# Patient Record
Sex: Female | Born: 1945 | Hispanic: Yes | Marital: Single | Smoking: Never smoker
Health system: Southern US, Community
[De-identification: ages and names within clinical notes are randomized; demographics above are authoritative.]

## PROBLEM LIST (undated history)

## (undated) DIAGNOSIS — E785 Hyperlipidemia, unspecified: Secondary | ICD-10-CM

## (undated) DIAGNOSIS — M545 Low back pain, unspecified: Secondary | ICD-10-CM

## (undated) DIAGNOSIS — F419 Anxiety disorder, unspecified: Secondary | ICD-10-CM

## (undated) DIAGNOSIS — M1990 Unspecified osteoarthritis, unspecified site: Secondary | ICD-10-CM

## (undated) DIAGNOSIS — G5600 Carpal tunnel syndrome, unspecified upper limb: Secondary | ICD-10-CM

## (undated) DIAGNOSIS — Z8719 Personal history of other diseases of the digestive system: Secondary | ICD-10-CM

## (undated) DIAGNOSIS — D126 Benign neoplasm of colon, unspecified: Secondary | ICD-10-CM

## (undated) DIAGNOSIS — M542 Cervicalgia: Secondary | ICD-10-CM

## (undated) DIAGNOSIS — M549 Dorsalgia, unspecified: Secondary | ICD-10-CM

## (undated) DIAGNOSIS — H25019 Cortical age-related cataract, unspecified eye: Secondary | ICD-10-CM

## (undated) DIAGNOSIS — K449 Diaphragmatic hernia without obstruction or gangrene: Secondary | ICD-10-CM

## (undated) DIAGNOSIS — I10 Essential (primary) hypertension: Secondary | ICD-10-CM

## (undated) DIAGNOSIS — K219 Gastro-esophageal reflux disease without esophagitis: Secondary | ICD-10-CM

## (undated) DIAGNOSIS — M51369 Other intervertebral disc degeneration, lumbar region without mention of lumbar back pain or lower extremity pain: Secondary | ICD-10-CM

## (undated) DIAGNOSIS — J45909 Unspecified asthma, uncomplicated: Secondary | ICD-10-CM

## (undated) DIAGNOSIS — N189 Chronic kidney disease, unspecified: Secondary | ICD-10-CM

## (undated) DIAGNOSIS — D649 Anemia, unspecified: Secondary | ICD-10-CM

## (undated) DIAGNOSIS — R519 Headache, unspecified: Secondary | ICD-10-CM

## (undated) DIAGNOSIS — M5136 Other intervertebral disc degeneration, lumbar region: Secondary | ICD-10-CM

## (undated) DIAGNOSIS — J309 Allergic rhinitis, unspecified: Secondary | ICD-10-CM

## (undated) DIAGNOSIS — E119 Type 2 diabetes mellitus without complications: Secondary | ICD-10-CM

## (undated) DIAGNOSIS — F32A Depression, unspecified: Secondary | ICD-10-CM

## (undated) DIAGNOSIS — G459 Transient cerebral ischemic attack, unspecified: Secondary | ICD-10-CM

## (undated) DIAGNOSIS — K5790 Diverticulosis of intestine, part unspecified, without perforation or abscess without bleeding: Secondary | ICD-10-CM

## (undated) DIAGNOSIS — K2970 Gastritis, unspecified, without bleeding: Secondary | ICD-10-CM

## (undated) DIAGNOSIS — E669 Obesity, unspecified: Secondary | ICD-10-CM

## (undated) DIAGNOSIS — G43909 Migraine, unspecified, not intractable, without status migrainosus: Secondary | ICD-10-CM

## (undated) DIAGNOSIS — S62109A Fracture of unspecified carpal bone, unspecified wrist, initial encounter for closed fracture: Secondary | ICD-10-CM

## (undated) DIAGNOSIS — R51 Headache: Secondary | ICD-10-CM

## (undated) DIAGNOSIS — I639 Cerebral infarction, unspecified: Secondary | ICD-10-CM

## (undated) HISTORY — PX: OOPHORECTOMY: SHX86

## (undated) HISTORY — PX: EYE SURGERY: SHX253

## (undated) HISTORY — PX: OTHER SURGICAL HISTORY: SHX169

---

## 1965-07-18 HISTORY — PX: BREAST EXCISIONAL BIOPSY: SUR124

## 2005-07-04 ENCOUNTER — Emergency Department: Payer: Self-pay | Admitting: Emergency Medicine

## 2005-07-04 ENCOUNTER — Other Ambulatory Visit: Payer: Self-pay

## 2005-07-05 ENCOUNTER — Ambulatory Visit: Payer: Self-pay | Admitting: Emergency Medicine

## 2007-09-02 ENCOUNTER — Other Ambulatory Visit: Payer: Self-pay

## 2007-09-02 ENCOUNTER — Emergency Department: Payer: Self-pay | Admitting: Emergency Medicine

## 2009-05-06 ENCOUNTER — Ambulatory Visit: Payer: Self-pay | Admitting: Urology

## 2009-05-28 ENCOUNTER — Ambulatory Visit: Payer: Self-pay | Admitting: Urology

## 2009-07-20 ENCOUNTER — Ambulatory Visit: Payer: Self-pay | Admitting: Gastroenterology

## 2009-08-11 ENCOUNTER — Ambulatory Visit: Payer: Self-pay | Admitting: Cardiology

## 2009-08-11 ENCOUNTER — Ambulatory Visit: Payer: Self-pay | Admitting: Obstetrics and Gynecology

## 2009-08-14 ENCOUNTER — Ambulatory Visit: Payer: Self-pay | Admitting: Obstetrics and Gynecology

## 2009-08-21 ENCOUNTER — Ambulatory Visit: Payer: Self-pay | Admitting: Obstetrics and Gynecology

## 2010-01-07 ENCOUNTER — Ambulatory Visit: Payer: Self-pay | Admitting: Gastroenterology

## 2010-05-26 ENCOUNTER — Emergency Department: Payer: Self-pay | Admitting: Emergency Medicine

## 2010-11-05 ENCOUNTER — Ambulatory Visit: Payer: Self-pay | Admitting: Internal Medicine

## 2010-11-16 ENCOUNTER — Ambulatory Visit: Payer: Self-pay | Admitting: Ophthalmology

## 2010-11-23 ENCOUNTER — Ambulatory Visit: Payer: Self-pay | Admitting: Ophthalmology

## 2011-05-09 ENCOUNTER — Observation Stay: Payer: Self-pay | Admitting: Internal Medicine

## 2012-01-24 ENCOUNTER — Ambulatory Visit: Payer: Self-pay | Admitting: Internal Medicine

## 2012-07-18 HISTORY — PX: BREAST BIOPSY: SHX20

## 2012-09-06 ENCOUNTER — Ambulatory Visit: Payer: Self-pay | Admitting: Internal Medicine

## 2012-12-19 ENCOUNTER — Ambulatory Visit: Payer: Self-pay | Admitting: Neurology

## 2013-02-08 DIAGNOSIS — N390 Urinary tract infection, site not specified: Secondary | ICD-10-CM | POA: Insufficient documentation

## 2013-02-12 ENCOUNTER — Ambulatory Visit: Payer: Self-pay | Admitting: Obstetrics and Gynecology

## 2013-02-14 ENCOUNTER — Ambulatory Visit: Payer: Self-pay | Admitting: Obstetrics and Gynecology

## 2013-02-28 ENCOUNTER — Ambulatory Visit: Payer: Self-pay | Admitting: Surgery

## 2013-03-01 LAB — PATHOLOGY REPORT

## 2013-11-29 DIAGNOSIS — R350 Frequency of micturition: Secondary | ICD-10-CM | POA: Insufficient documentation

## 2013-11-29 DIAGNOSIS — R3915 Urgency of urination: Secondary | ICD-10-CM | POA: Insufficient documentation

## 2014-02-13 ENCOUNTER — Ambulatory Visit: Payer: Self-pay | Admitting: Internal Medicine

## 2014-06-25 DIAGNOSIS — E785 Hyperlipidemia, unspecified: Secondary | ICD-10-CM | POA: Insufficient documentation

## 2014-06-25 DIAGNOSIS — J45909 Unspecified asthma, uncomplicated: Secondary | ICD-10-CM | POA: Insufficient documentation

## 2014-06-25 DIAGNOSIS — F419 Anxiety disorder, unspecified: Secondary | ICD-10-CM | POA: Insufficient documentation

## 2014-06-25 DIAGNOSIS — F32A Depression, unspecified: Secondary | ICD-10-CM | POA: Insufficient documentation

## 2014-06-25 DIAGNOSIS — I10 Essential (primary) hypertension: Secondary | ICD-10-CM | POA: Insufficient documentation

## 2014-06-25 DIAGNOSIS — F418 Other specified anxiety disorders: Secondary | ICD-10-CM | POA: Insufficient documentation

## 2014-11-05 DIAGNOSIS — F32A Depression, unspecified: Secondary | ICD-10-CM | POA: Insufficient documentation

## 2014-11-05 DIAGNOSIS — F329 Major depressive disorder, single episode, unspecified: Secondary | ICD-10-CM | POA: Insufficient documentation

## 2014-11-05 DIAGNOSIS — I10 Essential (primary) hypertension: Secondary | ICD-10-CM | POA: Insufficient documentation

## 2014-11-05 DIAGNOSIS — R229 Localized swelling, mass and lump, unspecified: Secondary | ICD-10-CM | POA: Insufficient documentation

## 2014-11-05 DIAGNOSIS — M159 Polyosteoarthritis, unspecified: Secondary | ICD-10-CM | POA: Insufficient documentation

## 2014-11-05 DIAGNOSIS — K219 Gastro-esophageal reflux disease without esophagitis: Secondary | ICD-10-CM | POA: Insufficient documentation

## 2014-11-09 ENCOUNTER — Emergency Department
Admit: 2014-11-09 | Disposition: A | Discharge status: Home / Self Care | Payer: Self-pay | Admitting: Emergency Medicine

## 2014-11-09 LAB — CBC
HCT: 43 % (ref 35.0–47.0)
HGB: 14 g/dL (ref 12.0–16.0)
MCH: 29.9 pg (ref 26.0–34.0)
MCHC: 32.5 g/dL (ref 32.0–36.0)
MCV: 92 fL (ref 80–100)
Platelet: 355 10*3/uL (ref 150–440)
RBC: 4.66 10*6/uL (ref 3.80–5.20)
RDW: 14.7 % — ABNORMAL HIGH (ref 11.5–14.5)
WBC: 8.4 10*3/uL (ref 3.6–11.0)

## 2014-11-09 LAB — COMPREHENSIVE METABOLIC PANEL
Albumin: 4.2 g/dL
Alkaline Phosphatase: 57 U/L
Anion Gap: 10 (ref 7–16)
BUN: 27 mg/dL — ABNORMAL HIGH
Bilirubin,Total: 0.3 mg/dL
Calcium, Total: 9 mg/dL
Chloride: 103 mmol/L
Co2: 24 mmol/L
Creatinine: 1.69 mg/dL — ABNORMAL HIGH
EGFR (African American): 35 — ABNORMAL LOW
EGFR (Non-African Amer.): 30 — ABNORMAL LOW
Glucose: 111 mg/dL — ABNORMAL HIGH
Potassium: 3.8 mmol/L
SGOT(AST): 30 U/L
SGPT (ALT): 19 U/L
Sodium: 137 mmol/L
Total Protein: 7.7 g/dL

## 2014-11-09 LAB — TROPONIN I: Troponin-I: <0.03 ng/mL

## 2014-11-13 ENCOUNTER — Other Ambulatory Visit: Payer: Self-pay | Admitting: Ophthalmology

## 2014-11-13 DIAGNOSIS — G453 Amaurosis fugax: Secondary | ICD-10-CM

## 2014-11-17 ENCOUNTER — Ambulatory Visit
Admission: RE | Admit: 2014-11-17 | Discharge: 2014-11-17 | Disposition: A | Discharge status: Home / Self Care | Payer: Medicare PPO | Source: Ambulatory Visit | Attending: Ophthalmology | Admitting: Ophthalmology

## 2014-11-17 DIAGNOSIS — G453 Amaurosis fugax: Secondary | ICD-10-CM

## 2014-11-17 DIAGNOSIS — I7789 Other specified disorders of arteries and arterioles: Secondary | ICD-10-CM | POA: Diagnosis not present

## 2014-12-25 ENCOUNTER — Other Ambulatory Visit: Payer: Self-pay | Admitting: Physical Medicine and Rehabilitation

## 2014-12-25 DIAGNOSIS — M5417 Radiculopathy, lumbosacral region: Secondary | ICD-10-CM

## 2015-01-07 ENCOUNTER — Ambulatory Visit
Admission: RE | Admit: 2015-01-07 | Discharge: 2015-01-07 | Disposition: A | Discharge status: Home / Self Care | Payer: Medicare PPO | Source: Ambulatory Visit | Attending: Physical Medicine and Rehabilitation | Admitting: Physical Medicine and Rehabilitation

## 2015-01-07 ENCOUNTER — Other Ambulatory Visit: Payer: Self-pay | Admitting: Physical Medicine and Rehabilitation

## 2015-01-07 ENCOUNTER — Ambulatory Visit: Admission: RE | Admit: 2015-01-07 | Payer: Self-pay | Source: Ambulatory Visit

## 2015-01-07 DIAGNOSIS — M79605 Pain in left leg: Secondary | ICD-10-CM | POA: Diagnosis present

## 2015-01-07 DIAGNOSIS — M5127 Other intervertebral disc displacement, lumbosacral region: Secondary | ICD-10-CM | POA: Insufficient documentation

## 2015-01-07 DIAGNOSIS — M4806 Spinal stenosis, lumbar region: Secondary | ICD-10-CM | POA: Diagnosis not present

## 2015-01-07 DIAGNOSIS — M5126 Other intervertebral disc displacement, lumbar region: Secondary | ICD-10-CM | POA: Diagnosis not present

## 2015-01-07 DIAGNOSIS — M79604 Pain in right leg: Secondary | ICD-10-CM | POA: Diagnosis present

## 2015-01-07 DIAGNOSIS — M47896 Other spondylosis, lumbar region: Secondary | ICD-10-CM | POA: Insufficient documentation

## 2015-01-07 DIAGNOSIS — M5416 Radiculopathy, lumbar region: Secondary | ICD-10-CM

## 2015-01-07 DIAGNOSIS — M5186 Other intervertebral disc disorders, lumbar region: Secondary | ICD-10-CM | POA: Diagnosis not present

## 2015-01-07 DIAGNOSIS — M545 Low back pain: Secondary | ICD-10-CM | POA: Diagnosis present

## 2015-03-26 ENCOUNTER — Encounter: Admission: RE | Payer: Self-pay | Source: Ambulatory Visit

## 2015-03-26 ENCOUNTER — Ambulatory Visit: Admission: RE | Admit: 2015-03-26 | Payer: Medicare PPO | Source: Ambulatory Visit | Admitting: Gastroenterology

## 2015-03-26 SURGERY — ESOPHAGOGASTRODUODENOSCOPY (EGD) WITH PROPOFOL
Anesthesia: General

## 2015-03-30 ENCOUNTER — Emergency Department
Admission: EM | Admit: 2015-03-30 | Discharge: 2015-03-30 | Disposition: A | Discharge status: Home / Self Care | Payer: Medicare PPO | Attending: Emergency Medicine | Admitting: Emergency Medicine

## 2015-03-30 ENCOUNTER — Encounter: Payer: Self-pay | Admitting: Emergency Medicine

## 2015-03-30 DIAGNOSIS — G8929 Other chronic pain: Secondary | ICD-10-CM | POA: Insufficient documentation

## 2015-03-30 DIAGNOSIS — I10 Essential (primary) hypertension: Secondary | ICD-10-CM | POA: Diagnosis not present

## 2015-03-30 DIAGNOSIS — R202 Paresthesia of skin: Secondary | ICD-10-CM | POA: Insufficient documentation

## 2015-03-30 DIAGNOSIS — M545 Low back pain, unspecified: Secondary | ICD-10-CM

## 2015-03-30 DIAGNOSIS — R42 Dizziness and giddiness: Secondary | ICD-10-CM | POA: Diagnosis present

## 2015-03-30 HISTORY — DX: Essential (primary) hypertension: I10

## 2015-03-30 HISTORY — DX: Dorsalgia, unspecified: M54.9

## 2015-03-30 HISTORY — DX: Cerebral infarction, unspecified: I63.9

## 2015-03-30 LAB — URINALYSIS COMPLETE WITH MICROSCOPIC (ARMC ONLY)
Bacteria, UA: NONE SEEN
Bilirubin Urine: NEGATIVE
Glucose, UA: NEGATIVE mg/dL
Ketones, ur: NEGATIVE mg/dL
Leukocytes, UA: NEGATIVE
Nitrite: NEGATIVE
Protein, ur: NEGATIVE mg/dL
Specific Gravity, Urine: 1.015 (ref 1.005–1.030)
pH: 5 (ref 5.0–8.0)

## 2015-03-30 LAB — CBC
HCT: 40.5 % (ref 35.0–47.0)
Hemoglobin: 13.2 g/dL (ref 12.0–16.0)
MCH: 30 pg (ref 26.0–34.0)
MCHC: 32.6 g/dL (ref 32.0–36.0)
MCV: 92.1 fL (ref 80.0–100.0)
Platelets: 331 10*3/uL (ref 150–440)
RBC: 4.4 MIL/uL (ref 3.80–5.20)
RDW: 15.4 % — ABNORMAL HIGH (ref 11.5–14.5)
WBC: 4.9 10*3/uL (ref 3.6–11.0)

## 2015-03-30 LAB — BASIC METABOLIC PANEL
Anion gap: 8 (ref 5–15)
BUN: 21 mg/dL — ABNORMAL HIGH (ref 6–20)
CO2: 27 mmol/L (ref 22–32)
Calcium: 9.1 mg/dL (ref 8.9–10.3)
Chloride: 103 mmol/L (ref 101–111)
Creatinine, Ser: 0.95 mg/dL (ref 0.44–1.00)
GFR calc Af Amer: >60 mL/min (ref >60)
GFR calc non Af Amer: 59 mL/min — ABNORMAL LOW (ref >60)
Glucose, Bld: 105 mg/dL — ABNORMAL HIGH (ref 65–99)
Potassium: 3.6 mmol/L (ref 3.5–5.1)
Sodium: 138 mmol/L (ref 135–145)

## 2015-03-30 NOTE — Discharge Instructions (Signed)
Back Pain, Adult Back pain is very common. The pain often gets better over time. The cause of back pain is usually not dangerous. Most people can learn to manage their back pain on their own.  HOME CARE   Stay active. Start with short walks on flat ground if you can. Try to walk farther each day.  Do not sit, drive, or stand in one place for more than 30 minutes. Do not stay in bed.  Do not avoid exercise or work. Activity can help your back heal faster.  Be careful when you bend or lift an object. Bend at your knees, keep the object close to you, and do not twist.  Sleep on a firm mattress. Lie on your side, and bend your knees. If you lie on your back, put a pillow under your knees.  Only take medicines as told by your doctor.  Put ice on the injured area.  Put ice in a plastic bag.  Place a towel between your skin and the bag.  Leave the ice on for 15-20 minutes, 03-04 times a day for the first 2 to 3 days. After that, you can switch between ice and heat packs.  Ask your doctor about back exercises or massage.  Avoid feeling anxious or stressed. Find good ways to deal with stress, such as exercise. GET HELP RIGHT AWAY IF:   Your pain does not go away with rest or medicine.  Your pain does not go away in 1 week.  You have new problems.  You do not feel well.  The pain spreads into your legs.  You cannot control when you poop (bowel movement) or pee (urinate).  Your arms or legs feel weak or lose feeling (numbness).  You feel sick to your stomach (nauseous) or throw up (vomit).  You have belly (abdominal) pain.  You feel like you may pass out (faint). MAKE SURE YOU:   Understand these instructions.  Will watch your condition.  Will get help right away if you are not doing well or get worse. Document Released: 12/21/2007 Document Revised: 09/26/2011 Document Reviewed: 11/05/2013 [facility] Patient Information 2015 [place], [place]. This information is not intended  to replace advice given to you by your health care provider. Make sure you discuss any questions you have with your health care provider.  Paresthesia Paresthesia is a burning or prickling feeling. This feeling can happen in any part of the body. It often happens in the hands, arms, legs, or feet. HOME CARE  Avoid drinking alcohol.  Try massage or needle therapy (acupuncture) to help with your problems.  Keep all doctor visits as told. GET HELP RIGHT AWAY IF:   You feel weak.  You have trouble walking or moving.  You have problems speaking or seeing.  You feel confused.  You cannot control when you poop (bowel movement) or pee (urinate).  You lose feeling (numbness) after an injury.  You pass out (faint).  Your burning or prickling feeling gets worse when you walk.  You have pain, cramps, or feel dizzy.  You have a rash. MAKE SURE YOU:   Understand these instructions.  Will watch your condition.  Will get help right away if you are not doing well or get worse. Document Released: 06/16/2008 Document Revised: 09/26/2011 Document Reviewed: 03/25/2011 [facility] Patient Information 2015 [place], [place]. This information is not intended to replace advice given to you by your health care provider. Make sure you discuss any questions you have with your health care  provider.

## 2015-03-30 NOTE — ED Notes (Signed)
Pt states right sided leg pain today at work, states she felt "like it was wet", also states dizziness for the past few weeks, states she had an MRI and a cortisone shot at the clinic recently, states some knee pain and lower back pain

## 2015-03-30 NOTE — ED Provider Notes (Addendum)
[facility] Emergency Department Provider Note  ____________________________________________  Time seen: Approximately 2:30 PM  I have reviewed the triage vital signs and the nursing notes.   HISTORY  Chief Complaint Fall; Leg Pain; and Dizziness    HPI Jackie Hunter is a 69 y.o. female with a two-year history of balance issues, back pain and lower extremity weakness was presenting today for lower extremity weakness and difficulty standing. She says that she was at work when she began to feel off balance and also had a feeling like there was "water on her leg." She says that she was sent to urgent care by her supervisor who did not want her to be sick at work. She is also reporting diffuse body aches this time but without any cough, rhinorrhea or fever.She has had a thorough workup for this issue including an MRI this past June which showed some degenerative disc disease. 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 At this time she is not complaining of any pain or sensation deficit to her lower extremities. She denies any loss of bowel or bladder continence. She says that her back pain is to the low lumbar region and across the back. She has a follow-up appointment this Thursday at the spine center at the [facility]. Despite the nursing notes reading "dizziness" the patient denies any feelings of the room spinning but says that she just feels off balance when she walks.   Past Medical History  Diagnosis Date  . Hypertension   . Back pain   . Stroke     There are no active problems to display for this patient.   Past Surgical History  Procedure Laterality Date  . Oophorectomy      left    No current outpatient prescriptions on file.  Allergies Codeine  No family history on file.  Social History Social History  Substance Use Topics  . Smoking status: Never Smoker   . Smokeless  tobacco: None  . Alcohol Use: Yes    Review of Systems Constitutional: No fever/chills Eyes: No visual changes. ENT: No sore throat. Cardiovascular: Denies chest pain. Respiratory: Denies shortness of breath. Gastrointestinal: No abdominal pain.  No nausea, no vomiting.  No diarrhea.  No constipation. Genitourinary: Negative for dysuria. Musculoskeletal: As above  Skin: Negative for rash. Neurological: Negative for headaches,   10-point ROS otherwise negative.  ____________________________________________   PHYSICAL EXAM:  VITAL SIGNS: ED Triage Vitals  Enc Vitals Group     BP 03/30/15 1121 131/67 mmHg     Pulse Rate 03/30/15 1121 64     Resp 03/30/15 1121 16     Temp 03/30/15 1121 97.8 F (36.6 C)     Temp Source 03/30/15 1121 Oral     SpO2 03/30/15 1121 100 %     Weight 03/30/15 1121 174 lb (78.926 kg)     Height 03/30/15 1121 5\' 3"  (1.6 m)     Head Cir --      Peak Flow --      Pain Score 03/30/15 1122 8     Pain Loc --      Pain Edu? --      Excl. in [place]? --     Constitutional: Alert and oriented. Well appearing and in no acute distress. Eyes: Conjunctivae are normal. PERRL. EOMI. Head: Atraumatic. Nose: No congestion/rhinnorhea. Mouth/Throat: Mucous membranes are moist.  Oropharynx non-erythematous. Neck: No stridor.  Cardiovascular: Normal rate, regular rhythm. Grossly normal heart sounds.  Good peripheral circulation. Respiratory: Normal respiratory effort.  No retractions. Lungs CTAB. Gastrointestinal: Soft and nontender. No distention. No abdominal bruits. No CVA tenderness. Musculoskeletal: No lower extremity tenderness nor edema.  No joint effusions. Negative straight leg raise bilaterally. No tenderness to the back throughout. There is no deformity or step-off to spine. 5 out of 5 strength to lower extremities. There is no saddle anesthesia. Neurologic:  Normal speech and language. No gross focal neurologic deficits are appreciated. No gait  instability, however the patient does say that she feels off balance when ambulating. Skin:  Skin is warm, dry and intact. No rash noted. Psychiatric: Mood and affect are normal. Speech and behavior are normal.  ____________________________________________   LABS (all labs ordered are listed, but only abnormal results are displayed)  Labs Reviewed  BASIC METABOLIC PANEL - Abnormal; Notable for the following:    Glucose, Bld 105 (*)    BUN 21 (*)    GFR calc non Af Amer 59 (*)    All other components within normal limits  CBC - Abnormal; Notable for the following:    RDW 15.4 (*)    All other components within normal limits  URINALYSIS COMPLETEWITH MICROSCOPIC ([facility] ONLY) - Abnormal; Notable for the following:    Color, Urine YELLOW (*)    APPearance CLEAR (*)    Hgb urine dipstick 2+ (*)    Squamous Epithelial / LPF 0-5 (*)    All other components within normal limits  CBG MONITORING, ED   ____________________________________________  EKG  ED ECG REPORT I, Doran Stabler, the attending physician, personally viewed and interpreted this ECG.   Date: 03/30/2015  EKG Time: 1138  Rate: 59  Rhythm: sinus bradycardia  Axis: Normal axis  Intervals:none  ST&T Change: No ST segments elevated or depressed. No abnormal T-wave inversions. Unchanged morphology of the septal leads from 11/16/2010. ____________________________________________  RADIOLOGY   ____________________________________________   PROCEDURES    ____________________________________________   INITIAL IMPRESSION / ASSESSMENT AND PLAN / ED COURSE  Pertinent labs & imaging results that were available during my care of the patient were reviewed by me and considered in my medical decision making (see chart for details).  Feel that this is a represents an exacerbation of the patient's chronic symptoms. She will follow-up this Thursday at the [facility] spine center at her scheduled appointment. She will  likely pick up a cane at a local drug store. I opted not to do any further imaging at this time because the patient is alert he had a CAT scan long after the onset of the symptoms as well as an MRI. There was no sign of acute stroke or old stroke on her CAT scan. She is not showing any signs of spinal cord compression. ____________________________________________   FINAL CLINICAL IMPRESSION(S) / ED DIAGNOSES  Acute right lower summary paresthesia. Chronic back pain.    Orbie Pyo, MD 03/30/15 1501  Patient says that she is aware of her hematuria. She has been in discussion about this with her primary care doctor. Unclear cause of monocular vision loss.  No sclerosing lesions seen on her MRI.  Orbie Pyo, MD 03/30/15 [place], MD 03/30/15 [phone]

## 2015-03-30 NOTE — ED Notes (Signed)
For about a month has been falling due to dizziness.  About a month ago had injection in back for pain.  Today at work her right leg felt "wet" and then pain down the leg.

## 2015-04-02 DIAGNOSIS — M5416 Radiculopathy, lumbar region: Secondary | ICD-10-CM | POA: Insufficient documentation

## 2015-04-02 DIAGNOSIS — M5136 Other intervertebral disc degeneration, lumbar region: Secondary | ICD-10-CM | POA: Insufficient documentation

## 2015-04-02 DIAGNOSIS — M48062 Spinal stenosis, lumbar region with neurogenic claudication: Secondary | ICD-10-CM | POA: Insufficient documentation

## 2015-08-13 DIAGNOSIS — N183 Chronic kidney disease, stage 3 unspecified: Secondary | ICD-10-CM | POA: Insufficient documentation

## 2015-08-13 DIAGNOSIS — I10 Essential (primary) hypertension: Secondary | ICD-10-CM | POA: Diagnosis not present

## 2015-08-13 DIAGNOSIS — F331 Major depressive disorder, recurrent, moderate: Secondary | ICD-10-CM | POA: Diagnosis not present

## 2015-08-13 DIAGNOSIS — K219 Gastro-esophageal reflux disease without esophagitis: Secondary | ICD-10-CM | POA: Diagnosis not present

## 2015-09-10 DIAGNOSIS — M5136 Other intervertebral disc degeneration, lumbar region: Secondary | ICD-10-CM | POA: Diagnosis not present

## 2015-09-10 DIAGNOSIS — M5416 Radiculopathy, lumbar region: Secondary | ICD-10-CM | POA: Diagnosis not present

## 2015-09-10 DIAGNOSIS — M4806 Spinal stenosis, lumbar region: Secondary | ICD-10-CM | POA: Diagnosis not present

## 2015-09-16 DIAGNOSIS — R1013 Epigastric pain: Secondary | ICD-10-CM | POA: Diagnosis not present

## 2015-09-16 DIAGNOSIS — K219 Gastro-esophageal reflux disease without esophagitis: Secondary | ICD-10-CM | POA: Diagnosis not present

## 2015-09-16 DIAGNOSIS — R1319 Other dysphagia: Secondary | ICD-10-CM | POA: Diagnosis not present

## 2015-10-08 DIAGNOSIS — G4700 Insomnia, unspecified: Secondary | ICD-10-CM | POA: Diagnosis not present

## 2015-10-08 DIAGNOSIS — F418 Other specified anxiety disorders: Secondary | ICD-10-CM | POA: Diagnosis not present

## 2015-10-08 DIAGNOSIS — K219 Gastro-esophageal reflux disease without esophagitis: Secondary | ICD-10-CM | POA: Diagnosis not present

## 2015-10-08 DIAGNOSIS — M159 Polyosteoarthritis, unspecified: Secondary | ICD-10-CM | POA: Diagnosis not present

## 2015-10-08 DIAGNOSIS — M5442 Lumbago with sciatica, left side: Secondary | ICD-10-CM | POA: Diagnosis not present

## 2015-10-08 DIAGNOSIS — I10 Essential (primary) hypertension: Secondary | ICD-10-CM | POA: Diagnosis not present

## 2015-10-08 DIAGNOSIS — Z7689 Persons encountering health services in other specified circumstances: Secondary | ICD-10-CM | POA: Diagnosis not present

## 2015-10-08 DIAGNOSIS — M5441 Lumbago with sciatica, right side: Secondary | ICD-10-CM | POA: Diagnosis not present

## 2015-10-08 DIAGNOSIS — G8929 Other chronic pain: Secondary | ICD-10-CM | POA: Diagnosis not present

## 2015-10-23 ENCOUNTER — Encounter: Payer: Self-pay | Admitting: *Deleted

## 2015-10-26 ENCOUNTER — Encounter: Payer: Self-pay | Admitting: *Deleted

## 2015-10-26 ENCOUNTER — Ambulatory Visit
Admission: RE | Admit: 2015-10-26 | Discharge: 2015-10-26 | Disposition: A | Discharge status: Home / Self Care | Payer: PPO | Source: Ambulatory Visit | Attending: Gastroenterology | Admitting: Gastroenterology

## 2015-10-26 ENCOUNTER — Ambulatory Visit: Payer: PPO | Admitting: Certified Registered"

## 2015-10-26 ENCOUNTER — Encounter
Admission: RE | Disposition: A | Discharge status: Home / Self Care | Payer: Self-pay | Source: Ambulatory Visit | Attending: Gastroenterology

## 2015-10-26 DIAGNOSIS — R1310 Dysphagia, unspecified: Secondary | ICD-10-CM | POA: Diagnosis not present

## 2015-10-26 DIAGNOSIS — Z79899 Other long term (current) drug therapy: Secondary | ICD-10-CM | POA: Diagnosis not present

## 2015-10-26 DIAGNOSIS — E669 Obesity, unspecified: Secondary | ICD-10-CM | POA: Insufficient documentation

## 2015-10-26 DIAGNOSIS — Z885 Allergy status to narcotic agent status: Secondary | ICD-10-CM | POA: Insufficient documentation

## 2015-10-26 DIAGNOSIS — Z8673 Personal history of transient ischemic attack (TIA), and cerebral infarction without residual deficits: Secondary | ICD-10-CM | POA: Diagnosis not present

## 2015-10-26 DIAGNOSIS — K219 Gastro-esophageal reflux disease without esophagitis: Secondary | ICD-10-CM | POA: Diagnosis not present

## 2015-10-26 DIAGNOSIS — M1990 Unspecified osteoarthritis, unspecified site: Secondary | ICD-10-CM | POA: Insufficient documentation

## 2015-10-26 DIAGNOSIS — K2950 Unspecified chronic gastritis without bleeding: Secondary | ICD-10-CM | POA: Diagnosis not present

## 2015-10-26 DIAGNOSIS — J45909 Unspecified asthma, uncomplicated: Secondary | ICD-10-CM | POA: Insufficient documentation

## 2015-10-26 DIAGNOSIS — I10 Essential (primary) hypertension: Secondary | ICD-10-CM | POA: Diagnosis not present

## 2015-10-26 DIAGNOSIS — F419 Anxiety disorder, unspecified: Secondary | ICD-10-CM | POA: Diagnosis not present

## 2015-10-26 DIAGNOSIS — Z6830 Body mass index (BMI) 30.0-30.9, adult: Secondary | ICD-10-CM | POA: Insufficient documentation

## 2015-10-26 DIAGNOSIS — K2960 Other gastritis without bleeding: Secondary | ICD-10-CM | POA: Diagnosis not present

## 2015-10-26 HISTORY — DX: Unspecified asthma, uncomplicated: J45.909

## 2015-10-26 HISTORY — DX: Gastro-esophageal reflux disease without esophagitis: K21.9

## 2015-10-26 HISTORY — DX: Obesity, unspecified: E66.9

## 2015-10-26 HISTORY — DX: Migraine, unspecified, not intractable, without status migrainosus: G43.909

## 2015-10-26 HISTORY — DX: Headache, unspecified: R51.9

## 2015-10-26 HISTORY — DX: Anxiety disorder, unspecified: F41.9

## 2015-10-26 HISTORY — DX: Cervicalgia: M54.2

## 2015-10-26 HISTORY — DX: Carpal tunnel syndrome, unspecified upper limb: G56.00

## 2015-10-26 HISTORY — DX: Low back pain, unspecified: M54.50

## 2015-10-26 HISTORY — DX: Personal history of other diseases of the digestive system: Z87.19

## 2015-10-26 HISTORY — PX: ESOPHAGOGASTRODUODENOSCOPY (EGD) WITH PROPOFOL: SHX5813

## 2015-10-26 HISTORY — DX: Low back pain: M54.5

## 2015-10-26 HISTORY — DX: Transient cerebral ischemic attack, unspecified: G45.9

## 2015-10-26 HISTORY — DX: Unspecified osteoarthritis, unspecified site: M19.90

## 2015-10-26 HISTORY — DX: Hyperlipidemia, unspecified: E78.5

## 2015-10-26 HISTORY — DX: Headache: R51

## 2015-10-26 SURGERY — ESOPHAGOGASTRODUODENOSCOPY (EGD) WITH PROPOFOL
Anesthesia: General

## 2015-10-26 MED ORDER — PROPOFOL 10 MG/ML IV BOLUS
INTRAVENOUS | Status: DC | PRN
  Administered 2015-10-26: 60 mg via INTRAVENOUS

## 2015-10-26 MED ORDER — SODIUM CHLORIDE 0.9 % IV SOLN
INTRAVENOUS | Status: DC
Start: 2015-10-26 — End: 2015-10-26

## 2015-10-26 MED ORDER — SODIUM CHLORIDE 0.9 % IV SOLN
INTRAVENOUS | Status: DC
  Administered 2015-10-26: 1000 mL via INTRAVENOUS

## 2015-10-26 MED ORDER — PROPOFOL 500 MG/50ML IV EMUL
INTRAVENOUS | Status: DC | PRN
  Administered 2015-10-26: 125 ug/kg/min via INTRAVENOUS

## 2015-10-26 MED ORDER — LIDOCAINE HCL (PF) 2 % IJ SOLN
INTRAMUSCULAR | Status: DC | PRN
  Administered 2015-10-26: 80 mg via INTRADERMAL

## 2015-10-26 NOTE — Op Note (Signed)
[facility] Patient Name: Jackie Hunter Procedure Date: 10/26/2015 9:13 AM MRN: [id number] Account #: [id number] Date of Birth: [birth year] Admit Type: Outpatient Age: 70 Room: [facility] ENDO ROOM 4 Gender: Female Note Status: Finalized Procedure:            Upper GI endoscopy Indications:          Dysphagia, Suspected esophageal reflux, Hx of H.pyloi                        in the past Providers:            Lupita Dawn. Candace Cruise, MD Referring MD:         Glendon Axe (Referring MD) Medicines:            Monitored Anesthesia Care Complications:        No immediate complications. Procedure:            Pre-Anesthesia Assessment:                       - Prior to the procedure, a History and Physical was                        performed, and patient medications, allergies and                        sensitivities were reviewed. The patient's tolerance of                        previous anesthesia was reviewed.                       - The risks and benefits of the procedure and the                        sedation options and risks were discussed with the                        patient. All questions were answered and informed                        consent was obtained.                       - After reviewing the risks and benefits, the patient                        was deemed in satisfactory condition to undergo the                        procedure.                       After obtaining informed consent, the endoscope was                        passed under direct vision. Throughout the procedure,                        the patient's blood pressure, pulse, and oxygen  saturations were monitored continuously. The                        Colonoscope was introduced through the mouth, and                        advanced to the second part of duodenum. The upper GI                        endoscopy was accomplished without difficulty. The             patient tolerated the procedure well. Findings:      No endoscopic abnormality was evident in the esophagus to explain the       patient's complaint of dysphagia. It was decided, however, to proceed       with dilation of the entire esophagus. The scope was withdrawn. Dilation       was performed with a Maloney dilator with mild resistance at 61 Fr.      The entire examined stomach was normal. Biopsies were taken with a cold       forceps for Helicobacter pylori testing.      The examined duodenum was normal. Impression:           - No endoscopic esophageal abnormality to explain                        patient's dysphagia. Esophagus dilated. Dilated.                       - Normal stomach. Biopsied.                       - Normal examined duodenum. Recommendation:       - Discharge patient to home.                       - Observe patient's clinical course.                       - The findings and recommendations were discussed with                        the patient.                       - Await pathology results. Procedure Code(s):    --- Professional ---                       [phone], Esophagogastroduodenoscopy, flexible, transoral;                        with biopsy, single or multiple                       43450, Dilation of esophagus, by unguided sound or                        bougie, single or multiple passes Diagnosis Code(s):    --- Professional ---                       R13.10, Dysphagia, unspecified CPT copyright 2016 American Medical Association. All rights reserved.  The codes documented in this report are preliminary and upon coder review may  be revised to meet current compliance requirements. [name], MD 10/26/2015 9:22:49 AM This report has been signed electronically. Number of Addenda: 0 Note Initiated On: 10/26/2015 9:13 AM      [facility]

## 2015-10-26 NOTE — Anesthesia Postprocedure Evaluation (Signed)
Anesthesia Post Note  Patient: Jackie Hunter  Procedure(s) Performed: Procedure(s) (LRB): ESOPHAGOGASTRODUODENOSCOPY (EGD) WITH PROPOFOL (N/A)  Patient location during evaluation: Endoscopy Anesthesia Type: General Level of consciousness: awake Pain management: pain level controlled Vital Signs Assessment: post-procedure vital signs reviewed and stable Respiratory status: spontaneous breathing Cardiovascular status: blood pressure returned to baseline Postop Assessment: no headache Anesthetic complications: no    Last Vitals:  Filed Vitals:   10/26/15 0930 10/26/15 0935  BP: 97/54 97/54  Pulse:  73  Temp:    Resp:  22    Last Pain:  Filed Vitals:   10/26/15 0938  PainSc: 6                  Melina Mosteller M

## 2015-10-26 NOTE — Anesthesia Preprocedure Evaluation (Signed)
Anesthesia Evaluation  Patient identified by MRN, date of birth, ID band Patient awake    Reviewed: Allergy & Precautions, NPO status , Patient's Chart, lab work & pertinent test results  Airway Mallampati: I  TM Distance: >3 FB Neck ROM: Full    Dental  (+) Teeth Intact   Pulmonary asthma ,           Cardiovascular Exercise Tolerance: Poor hypertension, Pt. on medications Normal cardiovascular exam     Neuro/Psych TIACVA, No Residual Symptoms    GI/Hepatic hiatal hernia, GERD  Medicated and Controlled,  Endo/Other    Renal/GU      Musculoskeletal  (+) Arthritis , Osteoarthritis,    Abdominal (+) + obese,  Abdomen: soft.    Peds  Hematology   Anesthesia Other Findings   Reproductive/Obstetrics                             Anesthesia Physical Anesthesia Plan  ASA: III  Anesthesia Plan: General   Post-op Pain Management:    Induction: Intravenous  Airway Management Planned: Nasal Cannula  Additional Equipment:   Intra-op Plan:   Post-operative Plan:   Informed Consent: I have reviewed the patients History and Physical, chart, labs and discussed the procedure including the risks, benefits and alternatives for the proposed anesthesia with the patient or authorized representative who has indicated his/her understanding and acceptance.     Plan Discussed with: CRNA  Anesthesia Plan Comments:         Anesthesia Quick Evaluation

## 2015-10-26 NOTE — Transfer of Care (Signed)
Immediate Anesthesia Transfer of Care Note  Patient: Jackie Hunter  Procedure(s) Performed: Procedure(s): ESOPHAGOGASTRODUODENOSCOPY (EGD) WITH PROPOFOL (N/A)  Patient Location: PACU  Anesthesia Type:General  Level of Consciousness: responds to stimulation, sleeping  Airway & Oxygen Therapy: Patient Spontanous Breathing and Patient connected to nasal cannula oxygen  Post-op Assessment: Report given to RN and Post -op Vital signs reviewed and stable  Post vital signs: Reviewed and stable  Last Vitals:  Filed Vitals:   10/26/15 0925 10/26/15 0926  BP:  126/74  Pulse: 74 75  Temp: 36.1 C   Resp: 18 29    Complications: No apparent anesthesia complications

## 2015-10-26 NOTE — H&P (Signed)
Primary Care Physician:  Glendon Axe, MD Primary Gastroenterologist:  Dr. Candace Cruise  Pre-Procedure History & Physical: HPI:  Jackie Hunter is a 70 y.o. female is here for an EGD.  Past Medical History  Diagnosis Date  . Hypertension   . Back pain   . Stroke ([place])   . Asthma   . GERD (gastroesophageal reflux disease)   . History of hiatal hernia   . Headache   . Arthritis   . Lumbago   . TIA (transient ischemic attack)   . Lipids serum increased   . Obesity   . Migraines   . Carpal tunnel syndrome   . Cervicalgia   . Anxiety     Past Surgical History  Procedure Laterality Date  . Oophorectomy      left  . Eye surgery    . Cataracts Bilateral     Prior to Admission medications   Medication Sig Start Date End Date Taking? Authorizing Provider  albuterol (VENTOLIN HFA) 108 (90 Base) MCG/ACT inhaler Inhale 2 puffs into the lungs every 4 (four) hours as needed for wheezing or shortness of breath.   Yes Historical Provider, MD  escitalopram (LEXAPRO) 10 MG tablet Take 10 mg by mouth daily.   Yes Historical Provider, MD  gabapentin (NEURONTIN) 300 MG capsule Take 300 mg by mouth 3 (three) times daily.   Yes Historical Provider, MD  ibuprofen (ADVIL,MOTRIN) 200 MG tablet Take 200 mg by mouth every 6 (six) hours as needed.   Yes Historical Provider, MD  lisinopril-hydrochlorothiazide (PRINZIDE,ZESTORETIC) 10-12.5 MG tablet Take 1 tablet by mouth daily.   Yes Historical Provider, MD  mometasone (NASONEX) 50 MCG/ACT nasal spray Place 2 sprays into the nose daily.   Yes Historical Provider, MD  omega-3 acid ethyl esters (LOVAZA) 1 g capsule Take by mouth 2 (two) times daily.   Yes Historical Provider, MD  pantoprazole (PROTONIX) 40 MG tablet Take 40 mg by mouth daily.   Yes Historical Provider, MD  traMADol (ULTRAM) 50 MG tablet Take by mouth every 6 (six) hours as needed.   Yes Historical Provider, MD  vitamin B-12 (CYANOCOBALAMIN) 1000 MCG tablet Take 1,000 mcg by mouth daily.    Yes Historical Provider, MD    Allergies as of 10/20/2015 - never reviewed  Allergen Reaction Noted  . Codeine Anxiety 03/30/2015    History reviewed. No pertinent family history.  Social History   Social History  . Marital Status: Single    Spouse Name: N/A  . Number of Children: N/A  . Years of Education: N/A   Occupational History  . Not on file.   Social History Main Topics  . Smoking status: Never Smoker   . Smokeless tobacco: Not on file  . Alcohol Use: Yes  . Drug Use: Not on file  . Sexual Activity: Not on file   Other Topics Concern  . Not on file   Social History Narrative    Review of Systems: See HPI, otherwise negative ROS  Physical Exam: BP 126/65 mmHg  Pulse 73  Temp(Src) 97 F (36.1 C) (Tympanic)  Resp 18  Ht 5\' 3"  (1.6 m)  Wt 78.926 kg (174 lb)  BMI 30.83 kg/m2  SpO2 99% General:   Alert,  pleasant and cooperative in NAD Head:  Normocephalic and atraumatic. Neck:  Supple; no masses or thyromegaly. Lungs:  Clear throughout to auscultation.    Heart:  Regular rate and rhythm. Abdomen:  Soft, nontender and nondistended. Normal bowel sounds, without guarding, and  without rebound.   Neurologic:  Alert and  oriented x4;  grossly normal neurologically.  Impression/Plan: Jackie Hunter is here for an EGD to be performed for dysphagia, GERD  Risks, benefits, limitations, and alternatives regarding EGD have been reviewed with the patient.  Questions have been answered.  All parties agreeable.   Jackie Hunter, Lupita Dawn, MD  10/26/2015, 8:59 AM

## 2015-10-27 ENCOUNTER — Encounter: Payer: Self-pay | Admitting: Gastroenterology

## 2015-10-27 LAB — SURGICAL PATHOLOGY

## 2016-02-03 DIAGNOSIS — K219 Gastro-esophageal reflux disease without esophagitis: Secondary | ICD-10-CM | POA: Diagnosis not present

## 2016-02-03 DIAGNOSIS — F418 Other specified anxiety disorders: Secondary | ICD-10-CM | POA: Diagnosis not present

## 2016-02-03 DIAGNOSIS — I10 Essential (primary) hypertension: Secondary | ICD-10-CM | POA: Diagnosis not present

## 2016-02-03 DIAGNOSIS — M159 Polyosteoarthritis, unspecified: Secondary | ICD-10-CM | POA: Diagnosis not present

## 2016-02-03 DIAGNOSIS — Z7689 Persons encountering health services in other specified circumstances: Secondary | ICD-10-CM | POA: Diagnosis not present

## 2016-02-03 DIAGNOSIS — G4700 Insomnia, unspecified: Secondary | ICD-10-CM | POA: Diagnosis not present

## 2016-02-03 DIAGNOSIS — G8929 Other chronic pain: Secondary | ICD-10-CM | POA: Diagnosis not present

## 2016-02-03 DIAGNOSIS — M5442 Lumbago with sciatica, left side: Secondary | ICD-10-CM | POA: Diagnosis not present

## 2016-02-03 DIAGNOSIS — M5441 Lumbago with sciatica, right side: Secondary | ICD-10-CM | POA: Diagnosis not present

## 2016-02-18 ENCOUNTER — Ambulatory Visit (INDEPENDENT_AMBULATORY_CARE_PROVIDER_SITE_OTHER): Payer: PPO | Admitting: Urology

## 2016-02-18 ENCOUNTER — Encounter: Payer: Self-pay | Admitting: Urology

## 2016-02-18 VITALS — BP 140/105 | HR 73 | Ht 63.0 in | Wt 175.0 lb

## 2016-02-18 DIAGNOSIS — R310 Gross hematuria: Secondary | ICD-10-CM | POA: Diagnosis not present

## 2016-02-18 DIAGNOSIS — R3129 Other microscopic hematuria: Secondary | ICD-10-CM | POA: Diagnosis not present

## 2016-02-18 DIAGNOSIS — G43909 Migraine, unspecified, not intractable, without status migrainosus: Secondary | ICD-10-CM | POA: Insufficient documentation

## 2016-02-18 LAB — URINALYSIS, COMPLETE
Bilirubin, UA: NEGATIVE
Glucose, UA: NEGATIVE
Ketones, UA: NEGATIVE
Leukocytes, UA: NEGATIVE
Nitrite, UA: NEGATIVE
Protein, UA: NEGATIVE
Specific Gravity, UA: 1.025 (ref 1.005–1.030)
Urobilinogen, Ur: 0.2 mg/dL (ref 0.2–1.0)
pH, UA: 5.5 (ref 5.0–7.5)

## 2016-02-18 LAB — MICROSCOPIC EXAMINATION

## 2016-02-18 NOTE — Progress Notes (Signed)
02/18/2016 11:11 AM   Jackie Hunter [birth year] BP:[id number]  Referring provider: Glendon Axe, MD [facility] [place], [place] [address]  Chief Complaint  Patient presents with  . Hematuria    HPI: The patient is a 70 year old female presents for evaluation of microscopic hematuria. She denies history of gross hematuria. She's had microscopic hematuria intermittently for a number of years. She denies any other urinary problems at this time including dysuria, frequency, or incomplete bladder emptying. Her UA today shows 11-30 red blood cells.  She had a CT urogram performed in 2014 of which only the report is available to me. It was relatively unremarkable. She did have a 2 mm nonobstructing stone on the left.  She also had cystoscopy and a urine cytology at that time. Both were unremarkable.   PMH: Past Medical History:  Diagnosis Date  . Anxiety   . Arthritis   . Asthma   . Back pain   . Carpal tunnel syndrome   . Cervicalgia   . GERD (gastroesophageal reflux disease)   . Headache   . History of hiatal hernia   . Hypertension   . Lipids serum increased   . Lumbago   . Migraines   . Obesity   . Stroke ([place])   . TIA (transient ischemic attack)     Surgical History: Past Surgical History:  Procedure Laterality Date  . cataracts Bilateral   . ESOPHAGOGASTRODUODENOSCOPY (EGD) WITH PROPOFOL N/A 10/26/2015   Procedure: ESOPHAGOGASTRODUODENOSCOPY (EGD) WITH PROPOFOL;  Surgeon: [name], MD;  Location: [facility] ENDOSCOPY;  Service: Gastroenterology;  Laterality: N/A;  . EYE SURGERY    . OOPHORECTOMY     left    Home Medications:    Medication List       Accurate as of 02/18/16 11:11 AM. Always use your most recent med list.          ibuprofen 200 MG tablet Commonly known as:  ADVIL,MOTRIN Take 200 mg by mouth every 6 (six) hours as needed.   lisinopril-hydrochlorothiazide 10-12.5 MG tablet Commonly known as:   PRINZIDE,ZESTORETIC Take 1 tablet by mouth daily.   mometasone 50 MCG/ACT nasal spray Commonly known as:  NASONEX Place 2 sprays into the nose daily.   NEURONTIN 300 MG capsule Generic drug:  gabapentin Take 300 mg by mouth 3 (three) times daily.   omega-3 acid ethyl esters 1 g capsule Commonly known as:  LOVAZA Take by mouth 2 (two) times daily.   pantoprazole 40 MG tablet Commonly known as:  PROTONIX Take 40 mg by mouth daily.   traMADol 50 MG tablet Commonly known as:  ULTRAM Take by mouth every 6 (six) hours as needed.   VENTOLIN HFA 108 (90 Base) MCG/ACT inhaler Generic drug:  albuterol Inhale 2 puffs into the lungs every 4 (four) hours as needed for wheezing or shortness of breath.   vitamin B-12 1000 MCG tablet Commonly known as:  CYANOCOBALAMIN Take 1,000 mcg by mouth daily.       Allergies:  Allergies  Allergen Reactions  . Codeine Anxiety    Family History: Family History  Problem Relation Age of Onset  . Kidney cancer Neg Hx   . Prostate cancer Neg Hx     Social History:  reports that she has never smoked. She has never used smokeless tobacco. She reports that she drinks alcohol. Her drug history is not on file.  ROS: UROLOGY Frequent Urination?: No Hard to postpone urination?: No Burning/pain with urination?:  No Get up at night to urinate?: No Leakage of urine?: No Urine stream starts and stops?: No Trouble starting stream?: No Do you have to strain to urinate?: No Blood in urine?: No Urinary tract infection?: No Sexually transmitted disease?: No Injury to kidneys or bladder?: No Painful intercourse?: No Weak stream?: No Currently pregnant?: No Vaginal bleeding?: No Last menstrual period?: n  Gastrointestinal Nausea?: No Vomiting?: No Indigestion/heartburn?: Yes Diarrhea?: No Constipation?: Yes  Constitutional Fever: No Night sweats?: No Weight loss?: No Fatigue?: Yes  Skin Skin rash/lesions?: No Itching?:  No  Eyes Blurred vision?: Yes Double vision?: No  Ears/Nose/Throat Sore throat?: No Sinus problems?: Yes  Hematologic/Lymphatic Swollen glands?: No Easy bruising?: No  Cardiovascular Leg swelling?: No Chest pain?: No  Respiratory Cough?: Yes Shortness of breath?: Yes  Endocrine Excessive thirst?: Yes  Musculoskeletal Back pain?: Yes Joint pain?: Yes  Neurological Headaches?: No Dizziness?: No  Psychologic Depression?: Yes Anxiety?: Yes  Physical Exam: BP (!) 140/105   Pulse 73   Ht 5\' 3"  (1.6 m)   Wt 175 lb (79.4 kg)   BMI 31.00 kg/m   Constitutional:  Alert and oriented, No acute distress. HEENT: [place] AT, moist mucus membranes.  Trachea midline, no masses. Cardiovascular: No clubbing, cyanosis, or edema. Respiratory: Normal respiratory effort, no increased work of breathing. GI: Abdomen is soft, nontender, nondistended, no abdominal masses GU: No CVA tenderness.  Skin: No rashes, bruises or suspicious lesions. Lymph: No cervical or inguinal adenopathy. Neurologic: Grossly intact, no focal deficits, moving all 4 extremities. Psychiatric: Normal mood and affect.  Laboratory Data: Lab Results  Component Value Date   WBC 4.9 03/30/2015   HGB 13.2 03/30/2015   HCT 40.5 03/30/2015   MCV 92.1 03/30/2015   PLT 331 03/30/2015    Lab Results  Component Value Date   CREATININE 0.95 03/30/2015    No results found for: PSA  No results found for: TESTOSTERONE  No results found for: HGBA1C  Urinalysis    Component Value Date/Time   COLORURINE YELLOW (A) 03/30/2015 1128   APPEARANCEUR CLEAR (A) 03/30/2015 1128   LABSPEC 1.015 03/30/2015 1128   PHURINE 5.0 03/30/2015 1128   GLUCOSEU NEGATIVE 03/30/2015 1128   HGBUR 2+ (A) 03/30/2015 1128   BILIRUBINUR NEGATIVE 03/30/2015 1128   KETONESUR NEGATIVE 03/30/2015 1128   PROTEINUR NEGATIVE 03/30/2015 1128   NITRITE NEGATIVE 03/30/2015 1128   LEUKOCYTESUR NEGATIVE 03/30/2015 1128      Assessment &  Plan:    1. Microscopic hematuria The patient had a relatively negative microhematuria workup 3-4 years ago. Given her persistent microscopic hematuria, think it would be reasonable for her to undergo this workup again. We will schedule her for a CT urogram followed by office cystoscopy. All questions were answered. The patient is agreeable to proceed.   Return for after CT for cystoscopy.  Nickie Retort, MD  [facility] [address], [place], [place] [address] [phone]

## 2016-02-19 ENCOUNTER — Other Ambulatory Visit: Payer: Self-pay | Admitting: Internal Medicine

## 2016-02-19 DIAGNOSIS — R1319 Other dysphagia: Secondary | ICD-10-CM | POA: Diagnosis not present

## 2016-02-19 DIAGNOSIS — Z1239 Encounter for other screening for malignant neoplasm of breast: Secondary | ICD-10-CM | POA: Diagnosis not present

## 2016-02-19 DIAGNOSIS — R739 Hyperglycemia, unspecified: Secondary | ICD-10-CM | POA: Diagnosis not present

## 2016-02-19 DIAGNOSIS — I10 Essential (primary) hypertension: Secondary | ICD-10-CM | POA: Diagnosis not present

## 2016-02-19 DIAGNOSIS — R1013 Epigastric pain: Secondary | ICD-10-CM | POA: Diagnosis not present

## 2016-02-19 DIAGNOSIS — M159 Polyosteoarthritis, unspecified: Secondary | ICD-10-CM | POA: Diagnosis not present

## 2016-02-19 DIAGNOSIS — Z1231 Encounter for screening mammogram for malignant neoplasm of breast: Secondary | ICD-10-CM

## 2016-02-19 DIAGNOSIS — K219 Gastro-esophageal reflux disease without esophagitis: Secondary | ICD-10-CM | POA: Diagnosis not present

## 2016-02-19 DIAGNOSIS — R3129 Other microscopic hematuria: Secondary | ICD-10-CM | POA: Diagnosis not present

## 2016-02-19 DIAGNOSIS — K2970 Gastritis, unspecified, without bleeding: Secondary | ICD-10-CM | POA: Diagnosis not present

## 2016-03-03 ENCOUNTER — Ambulatory Visit
Admission: RE | Admit: 2016-03-03 | Discharge: 2016-03-03 | Disposition: A | Discharge status: Home / Self Care | Payer: PPO | Source: Ambulatory Visit | Attending: Urology | Admitting: Urology

## 2016-03-03 DIAGNOSIS — K5790 Diverticulosis of intestine, part unspecified, without perforation or abscess without bleeding: Secondary | ICD-10-CM | POA: Insufficient documentation

## 2016-03-03 DIAGNOSIS — R3129 Other microscopic hematuria: Secondary | ICD-10-CM | POA: Diagnosis not present

## 2016-03-03 MED ORDER — IOPAMIDOL (ISOVUE-300) INJECTION 61%
125.0000 mL | Freq: Once | INTRAVENOUS | Status: AC | PRN
  Administered 2016-03-03: 125 mL via INTRAVENOUS

## 2016-03-04 ENCOUNTER — Ambulatory Visit: Payer: Medicare PPO

## 2016-03-10 DIAGNOSIS — M5416 Radiculopathy, lumbar region: Secondary | ICD-10-CM | POA: Diagnosis not present

## 2016-03-10 DIAGNOSIS — M4806 Spinal stenosis, lumbar region: Secondary | ICD-10-CM | POA: Diagnosis not present

## 2016-03-10 DIAGNOSIS — M5136 Other intervertebral disc degeneration, lumbar region: Secondary | ICD-10-CM | POA: Diagnosis not present

## 2016-03-17 ENCOUNTER — Ambulatory Visit: Payer: Medicare PPO

## 2016-03-17 ENCOUNTER — Ambulatory Visit (INDEPENDENT_AMBULATORY_CARE_PROVIDER_SITE_OTHER): Payer: PPO | Admitting: Urology

## 2016-03-17 VITALS — BP 133/73 | HR 59 | Ht 63.0 in | Wt 176.0 lb

## 2016-03-17 DIAGNOSIS — N281 Cyst of kidney, acquired: Secondary | ICD-10-CM

## 2016-03-17 DIAGNOSIS — K6389 Other specified diseases of intestine: Secondary | ICD-10-CM

## 2016-03-17 DIAGNOSIS — R3129 Other microscopic hematuria: Secondary | ICD-10-CM | POA: Diagnosis not present

## 2016-03-17 DIAGNOSIS — N200 Calculus of kidney: Secondary | ICD-10-CM | POA: Diagnosis not present

## 2016-03-17 DIAGNOSIS — Q6102 Congenital multiple renal cysts: Secondary | ICD-10-CM

## 2016-03-17 LAB — MICROSCOPIC EXAMINATION

## 2016-03-17 LAB — URINALYSIS, COMPLETE
Bilirubin, UA: NEGATIVE
Glucose, UA: NEGATIVE
Ketones, UA: NEGATIVE
Leukocytes, UA: NEGATIVE
Nitrite, UA: NEGATIVE
Protein, UA: NEGATIVE
Specific Gravity, UA: 1.015 (ref 1.005–1.030)
Urobilinogen, Ur: 0.2 mg/dL (ref 0.2–1.0)
pH, UA: 6 (ref 5.0–7.5)

## 2016-03-17 MED ORDER — LIDOCAINE HCL 2 % EX GEL
1.0000 "application " | Freq: Once | CUTANEOUS | Status: AC
  Administered 2016-03-17: 1 via URETHRAL

## 2016-03-17 MED ORDER — CIPROFLOXACIN HCL 500 MG PO TABS
500.0000 mg | ORAL_TABLET | Freq: Once | ORAL | Status: AC
  Administered 2016-03-17: 500 mg via ORAL

## 2016-03-17 NOTE — Progress Notes (Signed)
03/17/2016 10:23 AM   Jackie Hunter 09-15-1945 QP:[id number]  Referring provider: Glendon Axe, MD [facility] [place], [place] [address]  Chief Complaint  Patient presents with  . Cysto    HPI: The patient is a 70 year old female presents for evaluation of microscopic hematuria. She denies history of gross hematuria. She's had microscopic hematuria intermittently for a number of years. She denies any other urinary problems at this time including dysuria, frequency, or incomplete bladder emptying. Her UA today shows 11-30 red blood cells.    9She underwent a CT urogram. There is no obvious finding for her microhematuria outside of a small 2 mm nonobstructing upper pole left calculus. She had bilateral sub-centimeter lesions of her kidneys that were too small to characterize. Next  Incidentally she did have a finding of asymmetric thickening of the ascending colon that was concerning for a possible right colon adenocarcinoma.   PMH: Past Medical History:  Diagnosis Date  . Anxiety   . Arthritis   . Asthma   . Back pain   . Carpal tunnel syndrome   . Cervicalgia   . GERD (gastroesophageal reflux disease)   . Headache   . History of hiatal hernia   . Hypertension   . Lipids serum increased   . Lumbago   . Migraines   . Obesity   . Stroke ([place])   . TIA (transient ischemic attack)     Surgical History: Past Surgical History:  Procedure Laterality Date  . cataracts Bilateral   . ESOPHAGOGASTRODUODENOSCOPY (EGD) WITH PROPOFOL N/A 10/26/2015   Procedure: ESOPHAGOGASTRODUODENOSCOPY (EGD) WITH PROPOFOL;  Surgeon: [name], MD;  Location: [facility] ENDOSCOPY;  Service: Gastroenterology;  Laterality: N/A;  . EYE SURGERY    . OOPHORECTOMY     left    Home Medications:    Medication List       Accurate as of 03/17/16 10:23 AM. Always use your most recent med list.          ibuprofen 200 MG tablet Commonly known as:  ADVIL,MOTRIN Take 200  mg by mouth every 6 (six) hours as needed.   lisinopril-hydrochlorothiazide 10-12.5 MG tablet Commonly known as:  PRINZIDE,ZESTORETIC Take 1 tablet by mouth daily.   mometasone 50 MCG/ACT nasal spray Commonly known as:  NASONEX Place 2 sprays into the nose daily.   NEURONTIN 300 MG capsule Generic drug:  gabapentin Take 300 mg by mouth 3 (three) times daily.   omega-3 acid ethyl esters 1 g capsule Commonly known as:  LOVAZA Take by mouth 2 (two) times daily.   pantoprazole 40 MG tablet Commonly known as:  PROTONIX Take 40 mg by mouth daily.   predniSONE 5 MG tablet Commonly known as:  DELTASONE   traMADol 50 MG tablet Commonly known as:  ULTRAM Take by mouth every 6 (six) hours as needed.   VENTOLIN HFA 108 (90 Base) MCG/ACT inhaler Generic drug:  albuterol Inhale 2 puffs into the lungs every 4 (four) hours as needed for wheezing or shortness of breath.   vitamin B-12 1000 MCG tablet Commonly known as:  CYANOCOBALAMIN Take 1,000 mcg by mouth daily.       Allergies:  Allergies  Allergen Reactions  . Codeine Anxiety    Family History: Family History  Problem Relation Age of Onset  . Kidney cancer Neg Hx   . Prostate cancer Neg Hx     Social History:  reports that she has never smoked. She has never used smokeless  tobacco. She reports that she drinks alcohol. Her drug history is not on file.  ROS:                                        Physical Exam: BP 133/73   Pulse (!) 59   Ht 5\' 3"  (1.6 m)   Wt 176 lb (79.8 kg)   BMI 31.18 kg/m   Constitutional:  Alert and oriented, No acute distress. HEENT: [place] AT, moist mucus membranes.  Trachea midline, no masses. Cardiovascular: No clubbing, cyanosis, or edema. Respiratory: Normal respiratory effort, no increased work of breathing. GI: Abdomen is soft, nontender, nondistended, no abdominal masses GU: No CVA tenderness.  Skin: No rashes, bruises or suspicious lesions. Lymph: No  cervical or inguinal adenopathy. Neurologic: Grossly intact, no focal deficits, moving all 4 extremities. Psychiatric: Normal mood and affect.  Laboratory Data: Lab Results  Component Value Date   WBC 4.9 03/30/2015   HGB 13.2 03/30/2015   HCT 40.5 03/30/2015   MCV 92.1 03/30/2015   PLT 331 03/30/2015    Lab Results  Component Value Date   CREATININE 0.95 03/30/2015    No results found for: PSA  No results found for: TESTOSTERONE  No results found for: HGBA1C  Urinalysis    Component Value Date/Time   COLORURINE YELLOW (A) 03/30/2015 1128   APPEARANCEUR Hazy (A) 02/18/2016 1045   LABSPEC 1.015 03/30/2015 1128   PHURINE 5.0 03/30/2015 1128   GLUCOSEU Negative 02/18/2016 1045   HGBUR 2+ (A) 03/30/2015 1128   BILIRUBINUR Negative 02/18/2016 1045   KETONESUR NEGATIVE 03/30/2015 1128   PROTEINUR Negative 02/18/2016 [place] 03/30/2015 1128   NITRITE Negative 02/18/2016 1045   NITRITE NEGATIVE 03/30/2015 1128   LEUKOCYTESUR Negative 02/18/2016 1045    Pertinent Imaging: CLINICAL DATA:  Microhematuria x 3 years. Mild pelvic discomfort over 1 year. Pt denies dysuria. Left oophorectomy  EXAM: CT ABDOMEN AND PELVIS WITHOUT AND WITH CONTRAST  TECHNIQUE: Multidetector CT imaging of the abdomen and pelvis was performed following the standard protocol before and following the bolus administration of intravenous contrast.  CONTRAST:  179mL ISOVUE-300 IOPAMIDOL (ISOVUE-300) INJECTION 61%  COMPARISON:  None.  FINDINGS: Lower chest: Lung bases are clear.  Hepatobiliary: No focal hepatic lesion. No biliary duct dilatation. Gallbladder is normal. Common bile duct is normal.  Pancreas: Pancreas is normal. No ductal dilatation. No pancreatic inflammation.  Spleen: Normal spleen  Adrenals/urinary tract: Thin glands normal.  Small 2 mm nonobstructing calculus in the upper pole LEFT kidney. No ureterolithiasis or obstructive uropathy. No  enhancing renal cortical lesion. The bilateral sub cm lesions within the LEFT and RIGHT kidney which cannot be fully characterized primarily due to small size no filling defects within the collecting systems or ureters.  No bladder calculi, enhancing bladder lesions, or filling defect within the bladder.  Stomach/Bowel: The stomach, duodenum, small-bowel cecum are normal. Along the anticipated mesenteric border of the ascending colon is irregular asymmetric go thickening of the colon measuring 4.0 by 2.3 cm. There multiple diverticula of the ascending colon, transverse colon and descending colon. No acute inflammation.  There small ileocecal mesenteric lymph nodes. No retroperitoneal lymph nodes are central mesenteric lymph nodes.  Vascular/Lymphatic: Abdominal aorta is normal caliber with atherosclerotic calcification. There is no retroperitoneal or periportal lymphadenopathy. No pelvic lymphadenopathy.  Reproductive: Uterus normal  Other: No free fluid.  Musculoskeletal: No aggressive osseous lesion.  IMPRESSION: 1. No explanation hematuria. 2. Bilateral sub cm renal hypodense lesions cannot be fully characterized. No suspicious suggests features of these lesions which likely represent a combination of simple and complex cysts. Renal contrast MRI may better characterize these lesions. 3. **An incidental finding of potential clinical significance has been found. 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 Recommend colonoscopy evaluation. ** These results will be called to the ordering clinician or representative by the Radiologist Assistant, and communication documented in the PACS or zVision Dashboard. 4. Extensive diverticular disease throughout colon.    Cystoscopy Procedure Note  Patient identification was confirmed, informed consent was obtained, and patient was prepped using Betadine solution.   Lidocaine jelly was administered per urethral meatus.    Preoperative abx where received prior to procedure.    Procedure: - Flexible cystoscope introduced, without any difficulty.   - Thorough search of the bladder revealed:    normal urethral meatus    normal urothelium    no stones    no ulcers     no tumors    no urethral polyps    no trabeculation  - Ureteral orifices were normal in position and appearance.  Post-Procedure: - Patient tolerated the procedure well   Assessment & Plan:    1. Microscopic hematuria -negative workup except for small stones. Repeat urinalysis in one year.  2. Bilateral renal cysts These are small and have no suspicious features. No further workup needed.  3. Left renal stone. Stone is 2 mm in size. No further workup needed.  4. Right colon wall thickening I discussed this with the patient and gave her a copy of her CT report. She'll follow-up with her gastroenterologist, Dr. Candace Cruise, to discuss a possible colonoscopy.   Return in 1 year (on 03/17/2017).  Nickie Retort, MD  [facility] [address], [place], [place] [address] [phone]

## 2016-03-23 ENCOUNTER — Ambulatory Visit: Payer: Medicare PPO

## 2016-03-25 DIAGNOSIS — L910 Hypertrophic scar: Secondary | ICD-10-CM | POA: Diagnosis not present

## 2016-03-25 DIAGNOSIS — L905 Scar conditions and fibrosis of skin: Secondary | ICD-10-CM | POA: Diagnosis not present

## 2016-04-18 DIAGNOSIS — M79643 Pain in unspecified hand: Secondary | ICD-10-CM | POA: Insufficient documentation

## 2016-04-21 DIAGNOSIS — R935 Abnormal findings on diagnostic imaging of other abdominal regions, including retroperitoneum: Secondary | ICD-10-CM | POA: Diagnosis not present

## 2016-04-21 DIAGNOSIS — K219 Gastro-esophageal reflux disease without esophagitis: Secondary | ICD-10-CM | POA: Diagnosis not present

## 2016-04-21 DIAGNOSIS — Z87898 Personal history of other specified conditions: Secondary | ICD-10-CM | POA: Diagnosis not present

## 2016-04-22 ENCOUNTER — Encounter: Payer: Self-pay | Admitting: Emergency Medicine

## 2016-04-22 ENCOUNTER — Ambulatory Visit: Payer: Medicare PPO

## 2016-04-22 ENCOUNTER — Emergency Department
Admission: EM | Admit: 2016-04-22 | Discharge: 2016-04-22 | Disposition: A | Discharge status: Home / Self Care | Payer: PPO | Attending: Emergency Medicine | Admitting: Emergency Medicine

## 2016-04-22 ENCOUNTER — Emergency Department
Admission: EM | Admit: 2016-04-22 | Discharge: 2016-04-22 | Disposition: A | Discharge status: Home / Self Care | Payer: PPO | Attending: Student in an Organized Health Care Education/Training Program | Admitting: Student in an Organized Health Care Education/Training Program

## 2016-04-22 DIAGNOSIS — J45909 Unspecified asthma, uncomplicated: Secondary | ICD-10-CM | POA: Insufficient documentation

## 2016-04-22 DIAGNOSIS — R040 Epistaxis: Secondary | ICD-10-CM

## 2016-04-22 DIAGNOSIS — N183 Chronic kidney disease, stage 3 (moderate): Secondary | ICD-10-CM | POA: Diagnosis not present

## 2016-04-22 DIAGNOSIS — I129 Hypertensive chronic kidney disease with stage 1 through stage 4 chronic kidney disease, or unspecified chronic kidney disease: Secondary | ICD-10-CM | POA: Insufficient documentation

## 2016-04-22 DIAGNOSIS — Z8673 Personal history of transient ischemic attack (TIA), and cerebral infarction without residual deficits: Secondary | ICD-10-CM | POA: Diagnosis not present

## 2016-04-22 DIAGNOSIS — Z79899 Other long term (current) drug therapy: Secondary | ICD-10-CM | POA: Insufficient documentation

## 2016-04-22 DIAGNOSIS — J0120 Acute ethmoidal sinusitis, unspecified: Secondary | ICD-10-CM

## 2016-04-22 LAB — CBC WITH DIFFERENTIAL/PLATELET
Basophils Absolute: 0.1 10*3/uL (ref 0–0.1)
Basophils Relative: 1 %
Eosinophils Absolute: 0.2 10*3/uL (ref 0–0.7)
Eosinophils Relative: 3 %
HCT: 40.9 % (ref 35.0–47.0)
Hemoglobin: 13.3 g/dL (ref 12.0–16.0)
Lymphocytes Relative: 39 %
Lymphs Abs: 2.9 10*3/uL (ref 1.0–3.6)
MCH: 29.6 pg (ref 26.0–34.0)
MCHC: 32.6 g/dL (ref 32.0–36.0)
MCV: 90.7 fL (ref 80.0–100.0)
Monocytes Absolute: 0.4 10*3/uL (ref 0.2–0.9)
Monocytes Relative: 6 %
Neutro Abs: 3.8 10*3/uL (ref 1.4–6.5)
Neutrophils Relative %: 51 %
Platelets: 355 10*3/uL (ref 150–440)
RBC: 4.51 MIL/uL (ref 3.80–5.20)
RDW: 14.6 % — ABNORMAL HIGH (ref 11.5–14.5)
WBC: 7.4 10*3/uL (ref 3.6–11.0)

## 2016-04-22 LAB — BASIC METABOLIC PANEL
Anion gap: 6 (ref 5–15)
BUN: 21 mg/dL — ABNORMAL HIGH (ref 6–20)
CO2: 27 mmol/L (ref 22–32)
Calcium: 9.2 mg/dL (ref 8.9–10.3)
Chloride: 105 mmol/L (ref 101–111)
Creatinine, Ser: 0.9 mg/dL (ref 0.44–1.00)
GFR calc Af Amer: >60 mL/min (ref >60)
GFR calc non Af Amer: >60 mL/min (ref >60)
Glucose, Bld: 88 mg/dL (ref 65–99)
Potassium: 3.4 mmol/L — ABNORMAL LOW (ref 3.5–5.1)
Sodium: 138 mmol/L (ref 135–145)

## 2016-04-22 MED ORDER — AMOXICILLIN 500 MG PO CAPS
500.0000 mg | ORAL_CAPSULE | Freq: Once | ORAL | Status: AC
  Administered 2016-04-22: 500 mg via ORAL
  Filled 2016-04-22: qty 1

## 2016-04-22 MED ORDER — BACITRACIN ZINC 500 UNIT/GM EX OINT
TOPICAL_OINTMENT | CUTANEOUS | Status: AC
  Filled 2016-04-22: qty 3.6

## 2016-04-22 MED ORDER — OXYMETAZOLINE HCL 0.05 % NA SOLN
1.0000 | Freq: Once | NASAL | Status: AC
  Administered 2016-04-22: 1 via NASAL

## 2016-04-22 MED ORDER — AMOXICILLIN-POT CLAVULANATE 875-125 MG PO TABS
1.0000 | ORAL_TABLET | Freq: Two times a day (BID) | ORAL | 0 refills | Status: AC
Start: 2016-04-22 — End: 2016-04-29

## 2016-04-22 MED ORDER — OXYMETAZOLINE HCL 0.05 % NA SOLN
NASAL | Status: AC
  Administered 2016-04-22: 1 via NASAL
  Filled 2016-04-22: qty 15

## 2016-04-22 MED ORDER — BACITRACIN ZINC 500 UNIT/GM EX OINT
TOPICAL_OINTMENT | Freq: Two times a day (BID) | CUTANEOUS | Status: DC
  Administered 2016-04-22: 16:00:00 via TOPICAL

## 2016-04-22 NOTE — Discharge Instructions (Signed)
Follow-up with your primary care doctor at [facility] if any continued problems also he may schedule an appointment with Dr. Kathyrn Sheriff will with [facility] for problems with your nosebleed.  Avoid Aleve or any anti-inflammatories at this time. You may take Tylenol if needed for pain. Begin taking Augmentin twice a day for 10 days for sinus infection. Should you have a another nosebleed, pinched the end of your nose together and hold for approximately 15 minutes.  If there is urgent concerns or worsening of your nosebleeds you may return to the emergency room over the weekend.

## 2016-04-22 NOTE — ED Triage Notes (Signed)
Pt comes into the ED via EMS for the second time today due to epistaxis.  Patient was discharged this morning after an episode of epistaxis and has returned after it started bleeding a second time in [facility].  Upon patient arriving to facility the bleeding has stopped and is controlled.  Patient denies any weakness, dizziness, etc.  BP 240/110 per EMS.  Patient in NAD at this time and ambulatory into the hospital with EMS staff.

## 2016-04-22 NOTE — Discharge Instructions (Signed)
Please call the ear, nose, throat doctor for further outpatient follow-up. Packing should be removed in approximately 5 days.  Please return immediately if condition worsens. Please contact her primary physician or the physician you were given for referral. If you have any specialist physicians involved in her treatment and plan please also contact them. Thank you for using [facility].

## 2016-04-22 NOTE — ED Notes (Signed)
Pt ambulated per MD order. Pt had no difficulty walking and nose is not bleeding after walking the unit. Pt in bed at this time and RN will continue monitoring pt and nasal bleeding.

## 2016-04-22 NOTE — ED Notes (Signed)
Visual acuity: right eye (20/30) -1; left eye (20/50)

## 2016-04-22 NOTE — ED Provider Notes (Signed)
[facility] Emergency Department Provider Note  ____________________________________________   First MD Initiated Contact with Patient 04/22/16 1022     (approximate)  I have reviewed the triage vital signs and the nursing notes.   HISTORY  Chief Complaint Epistaxis   HPI Jackie Hunter is a 70 y.o. female is here in the emergency room via EMS after she had a nosebleed. Patient states that this is second nosebleed that she has had and a total of 3 nosebleeds this week. Patient states she was seen at her GI doctor yesterday at which time he she had lab work done and was called yesterday evening and told that her lab work was within normal limits. She is scheduled for colonoscopy in 10 days. She states she has taken an over-the-counter anti-inflammatory but no other medicines other than what is been prescribed. She denies any previous problems with nosebleeds. She states that in the past she has had problems with seasonal allergies and is sneezing occasionally but not a great deal. She does complain of some tenderness and pressure about her face that has been going on for approximately one week. Patient states that she does have a history of sinus infections. She denies any nosebleed at present. She states the pressure about her face is approximately a 9 out of 10.   Past Medical History:  Diagnosis Date  . Anxiety   . Arthritis   . Asthma   . Back pain   . Carpal tunnel syndrome   . Cervicalgia   . GERD (gastroesophageal reflux disease)   . Headache   . History of hiatal hernia   . Hypertension   . Lipids serum increased   . Lumbago   . Migraines   . Obesity   . Stroke ([place])   . TIA (transient ischemic attack)     Patient Active Problem List   Diagnosis Date Noted  . Migraines 02/18/2016  . CKD (chronic kidney disease) stage 3, GFR 30-59 ml/min 08/13/2015  . Moderate episode of recurrent major depressive disorder ([place]) 08/13/2015  . DDD  (degenerative disc disease), lumbar 04/02/2015  . Lumbar radiculitis 04/02/2015  . Lumbar stenosis with neurogenic claudication 04/02/2015  . Depressive disorder 11/05/2014  . Essential hypertension 11/05/2014  . Gastroesophageal reflux disease without esophagitis 11/05/2014  . Generalized osteoarthritis of multiple sites 11/05/2014  . Subcutaneous nodule 11/05/2014  . Anxiety associated with depression 06/25/2014  . Asthma without status asthmaticus 06/25/2014  . Benign essential hypertension 06/25/2014  . Other and unspecified hyperlipidemia 06/25/2014  . Frequency of micturition 11/29/2013  . Urinary urgency 11/29/2013  . Increased frequency of urination 11/29/2013  . Recurrent urinary tract infection 02/08/2013  . Urinary tract infection 02/08/2013    Past Surgical History:  Procedure Laterality Date  . cataracts Bilateral   . ESOPHAGOGASTRODUODENOSCOPY (EGD) WITH PROPOFOL N/A 10/26/2015   Procedure: ESOPHAGOGASTRODUODENOSCOPY (EGD) WITH PROPOFOL;  Surgeon: [name], MD;  Location: [facility] ENDOSCOPY;  Service: Gastroenterology;  Laterality: N/A;  . EYE SURGERY    . OOPHORECTOMY     left    Prior to Admission medications   Medication Sig Start Date End Date Taking? Authorizing Provider  albuterol (VENTOLIN HFA) 108 (90 Base) MCG/ACT inhaler Inhale 2 puffs into the lungs every 4 (four) hours as needed for wheezing or shortness of breath.    Historical Provider, MD  amoxicillin-clavulanate (AUGMENTIN) 875-125 MG tablet Take 1 tablet by mouth 2 (two) times daily. 04/22/16 04/29/16  Johnn Hai, PA-C  gabapentin (  NEURONTIN) 300 MG capsule Take 300 mg by mouth 3 (three) times daily.    Historical Provider, MD  ibuprofen (ADVIL,MOTRIN) 200 MG tablet Take 200 mg by mouth every 6 (six) hours as needed.    Historical Provider, MD  lisinopril-hydrochlorothiazide (PRINZIDE,ZESTORETIC) 10-12.5 MG tablet Take 1 tablet by mouth daily.    Historical Provider, MD  mometasone (NASONEX) 50  MCG/ACT nasal spray Place 2 sprays into the nose daily.    Historical Provider, MD  omega-3 acid ethyl esters (LOVAZA) 1 g capsule Take by mouth 2 (two) times daily.    Historical Provider, MD  pantoprazole (PROTONIX) 40 MG tablet Take 40 mg by mouth daily.    Historical Provider, MD  predniSONE (DELTASONE) 5 MG tablet  03/10/16   Historical Provider, MD  traMADol (ULTRAM) 50 MG tablet Take by mouth every 6 (six) hours as needed.    Historical Provider, MD  vitamin B-12 (CYANOCOBALAMIN) 1000 MCG tablet Take 1,000 mcg by mouth daily.    Historical Provider, MD    Allergies Codeine  Family History  Problem Relation Age of Onset  . Kidney cancer Neg Hx   . Prostate cancer Neg Hx     Social History Social History  Substance Use Topics  . Smoking status: Never Smoker  . Smokeless tobacco: Never Used  . Alcohol use Yes    Review of Systems Constitutional: No fever/chills Eyes: No visual changes. ENT: No sore throat.Positive for epistaxis. Positive for facial pain. Cardiovascular: Denies chest pain. Respiratory: Denies shortness of breath. Gastrointestinal: No abdominal pain.  No nausea, no vomiting.  Musculoskeletal: Negative for back pain. Skin: Negative for rash. Neurological: Negative for headaches, focal weakness or numbness.  10-point ROS otherwise negative.  ____________________________________________   PHYSICAL EXAM:  VITAL SIGNS: ED Triage Vitals [04/22/16 0954]  Enc Vitals Group     BP (!) 138/52     Pulse Rate 88     Resp 18     Temp 98.2 F (36.8 C)     Temp Source Oral     SpO2 98 %     Weight 173 lb (78.5 kg)     Height 5\' 3"  (1.6 m)     Head Circumference      Peak Flow      Pain Score 9     Pain Loc      Pain Edu?      Excl. in [place]?     Constitutional: Alert and oriented. Well appearing and in no acute distress. Eyes: Conjunctivae are normal. PERRL. EOMI. Head: Atraumatic.  There is minimal tenderness on percussion of the right maxillary sinus  area. Frontal sinuses were nontender. Nose: No congestion/rhinnorhea. On examination of each nostril there is no evidence of active bleeding and no actual focal site was seen for bleeding. Mouth/Throat: Mucous membranes are moist.  Oropharynx non-erythematous. No blood was noted on posterior pharynx. Neck: No stridor.   Hematological/Lymphatic/Immunilogical: No cervical lymphadenopathy. Cardiovascular: Normal rate, regular rhythm. Grossly normal heart sounds.  Good peripheral circulation. Respiratory: Normal respiratory effort.  No retractions. Lungs CTAB. Musculoskeletal: Patient moves upper and lower extremities without difficulty and normal gait was noted. Neurologic:  Normal speech and language. No gross focal neurologic deficits are appreciated. No gait instability. Skin:  Skin is warm, dry and intact. No rash noted. Psychiatric: Mood and affect are normal. Speech and behavior are normal.  ____________________________________________   LABS (all labs ordered are listed, but only abnormal results are displayed)  Labs Reviewed - No data  to display  PROCEDURES  Procedure(s) performed: None  Procedures  Critical Care performed: No  ____________________________________________   INITIAL IMPRESSION / ASSESSMENT AND PLAN / ED COURSE  Pertinent labs & imaging results that were available during my care of the patient were reviewed by me and considered in my medical decision making (see chart for details).    Clinical Course   Patient was started on Augmentin 875 twice a day for sinus infection. At this time there is no localized site on examination of the naris to explain her nosebleed and there is no posterior bleeding noted during exam. During the time the patient was in the emergency room there was no continued nosebleed. Patient is to follow-up with Dr. Kathyrn Sheriff if any continued nosebleeds. She is also advised not to take any anti-inflammatories as she is currently scheduled for  a colonoscopy.  ____________________________________________   FINAL CLINICAL IMPRESSION(S) / ED DIAGNOSES  Final diagnoses:  Acute ethmoidal sinusitis, recurrence not specified  Epistaxis, recurrent      NEW MEDICATIONS STARTED DURING THIS VISIT:  Discharge Medication List as of 04/22/2016 11:42 AM    START taking these medications   Details  amoxicillin-clavulanate (AUGMENTIN) 875-125 MG tablet Take 1 tablet by mouth 2 (two) times daily., Starting Fri 04/22/2016, Until Fri 04/29/2016, Print         Note:  This document was prepared using Dragon voice recognition software and may include unintentional dictation errors.    Johnn Hai, PA-C 04/22/16 [place], MD 04/23/16 [phone]

## 2016-04-22 NOTE — ED Triage Notes (Signed)
Pt states that she has been having pressure in her face for about a week, states that she had a nosebleed Monday, Wednesday, and again this am, pt was sent over from [place], pt saw her GI dr this week for upcoming colonoscopy, dizziness comes and goes

## 2016-04-22 NOTE — ED Notes (Signed)
States she has had nosebleeds times 3 this week  Had some bleeding on mon,weds and again this am   No bleeding noted at present

## 2016-04-22 NOTE — ED Provider Notes (Signed)
Time Seen: Approximately 1531 I have reviewed the triage notes  Chief Complaint: Epistaxis   History of Present Illness: Jackie Hunter is a 70 y.o. female who is here earlier today for left-sided epistaxis. Patient has been prescribed antibiotics by her primary physician and was at the store to pick up the prescription and started having a nosebleed once again. She describes a, exclusively from the left side. She is not currently on any blood thinners and denies any near syncopal symptoms at all. She denies any weakness or sweating. Per EMS her blood pressure was 240/110 but recheck here was 152/83.   Past Medical History:  Diagnosis Date  . Anxiety   . Arthritis   . Asthma   . Back pain   . Carpal tunnel syndrome   . Cervicalgia   . GERD (gastroesophageal reflux disease)   . Headache   . History of hiatal hernia   . Hypertension   . Lipids serum increased   . Lumbago   . Migraines   . Obesity   . Stroke ([place])   . TIA (transient ischemic attack)     Patient Active Problem List   Diagnosis Date Noted  . Migraines 02/18/2016  . CKD (chronic kidney disease) stage 3, GFR 30-59 ml/min 08/13/2015  . Moderate episode of recurrent major depressive disorder ([place]) 08/13/2015  . DDD (degenerative disc disease), lumbar 04/02/2015  . Lumbar radiculitis 04/02/2015  . Lumbar stenosis with neurogenic claudication 04/02/2015  . Depressive disorder 11/05/2014  . Essential hypertension 11/05/2014  . Gastroesophageal reflux disease without esophagitis 11/05/2014  . Generalized osteoarthritis of multiple sites 11/05/2014  . Subcutaneous nodule 11/05/2014  . Anxiety associated with depression 06/25/2014  . Asthma without status asthmaticus 06/25/2014  . Benign essential hypertension 06/25/2014  . Other and unspecified hyperlipidemia 06/25/2014  . Frequency of micturition 11/29/2013  . Urinary urgency 11/29/2013  . Increased frequency of urination 11/29/2013  . Recurrent urinary  tract infection 02/08/2013  . Urinary tract infection 02/08/2013    Past Surgical History:  Procedure Laterality Date  . cataracts Bilateral   . ESOPHAGOGASTRODUODENOSCOPY (EGD) WITH PROPOFOL N/A 10/26/2015   Procedure: ESOPHAGOGASTRODUODENOSCOPY (EGD) WITH PROPOFOL;  Surgeon: [name], MD;  Location: [facility] ENDOSCOPY;  Service: Gastroenterology;  Laterality: N/A;  . EYE SURGERY    . OOPHORECTOMY     left    Past Surgical History:  Procedure Laterality Date  . cataracts Bilateral   . ESOPHAGOGASTRODUODENOSCOPY (EGD) WITH PROPOFOL N/A 10/26/2015   Procedure: ESOPHAGOGASTRODUODENOSCOPY (EGD) WITH PROPOFOL;  Surgeon: [name], MD;  Location: [facility] ENDOSCOPY;  Service: Gastroenterology;  Laterality: N/A;  . EYE SURGERY    . OOPHORECTOMY     left    Current Outpatient Rx  . Order #: [id number] Class: Historical Med  . Order #: [id number] Class: Print  . Order #: [id number] Class: Historical Med  . Order #: [id number] Class: Historical Med  . Order #: [id number] Class: Historical Med  . Order #: [id number] Class: Historical Med  . Order #: [id number] Class: Historical Med  . Order #: [id number] Class: Historical Med  . Order #: [id number] Class: Historical Med  . Order #: [id number] Class: Historical Med  . Order #: [id number] Class: Historical Med    Allergies:  Codeine  Family History: Family History  Problem Relation Age of Onset  . Kidney cancer Neg Hx   . Prostate cancer Neg Hx     Social History: Social History  Substance Use Topics  . Smoking status: Never Smoker  . Smokeless tobacco: Never Used  .  Alcohol use Yes     Review of Systems:   10 point review of systems was performed and was otherwise negative:  Constitutional: No fever Eyes: No visual disturbances ENT: Patient describes a burning sensation at the top of her nose area. Cardiac: No chest pain Respiratory: No shortness of breath, wheezing, or stridor Abdomen: No abdominal pain, no vomiting, No diarrhea Endocrine:  No weight loss, No night sweats Extremities: No peripheral edema, cyanosis Skin: No rashes, easy bruising Neurologic: No focal weakness, trouble with speech or swollowing Urologic: No dysuria, Hematuria, or urinary frequency   Physical Exam:  ED Triage Vitals  Enc Vitals Group     BP 04/22/16 1327 (!) 152/83     Pulse Rate 04/22/16 1327 68     Resp 04/22/16 1327 16     Temp 04/22/16 1327 97.1 F (36.2 C)     Temp Source 04/22/16 1327 Oral     SpO2 04/22/16 1327 99 %     Weight 04/22/16 1328 173 lb (78.5 kg)     Height 04/22/16 1328 5\' 3"  (1.6 m)     Head Circumference --      Peak Flow --      Pain Score --      Pain Loc --      Pain Edu? --      Excl. in [place]? --     General: Awake , Alert , and Oriented times 3; GCS 15 Head: Normal cephalic , atraumatic Eyes: Pupils equal , round, reactive to light Nose/Throat: No nasal drainage, patent upper airway without erythema or exudate. Left nares is currently not bleeding. Neck: Supple, Full range of motion, No anterior adenopathy or palpable thyroid masses Lungs: Clear to ascultation without wheezes , rhonchi, or rales Heart: Regular rate, regular rhythm without murmurs , gallops , or rubs Abdomen: Soft, non tender without rebound, guarding , or rigidity; bowel sounds positive and symmetric in all 4 quadrants. No organomegaly .        Extremities: 2 plus symmetric pulses. No edema, clubbing or cyanosis Neurologic: normal ambulation, Motor symmetric without deficits, sensory intact Skin: warm, dry, no rashes   Labs:   All laboratory work was reviewed including any pertinent negatives or positives listed below:  Labs Reviewed  BASIC METABOLIC PANEL  CBC WITH DIFFERENTIAL/PLATELET   Procedures:  Left-sided nasal packing Patient had a Murocel gauze inserted with Neosporin coating it. Patient had Afrin nasal spray inflation of the pad. Patient appeared to tolerate the procedure well and no was no recurrent bleeding.    ED  Course: Patient was observed here in emergency department is otherwise hemodynamically stable. Blood work is within normal limits and she has a prescription for amoxicillin. I lined her with outpatient follow-up with ENT and she was advised to call them on Monday came removal in 5 days. She was given instructions if the packing falls out, if she has a re-bleed, etc. Clinical Course     Assessment: * Epistaxis    Plan:  Outpatient Patient was advised to return immediately if condition worsens. Patient was advised to follow up with their primary care physician or other specialized physicians involved in their outpatient care. The patient and/or family member/power of attorney had laboratory results reviewed at the bedside. All questions and concerns were addressed and appropriate discharge instructions were distributed by the nursing staff.             Daymon Larsen, MD 04/22/16 [phone]

## 2016-04-25 DIAGNOSIS — R040 Epistaxis: Secondary | ICD-10-CM | POA: Diagnosis not present

## 2016-04-27 DIAGNOSIS — R040 Epistaxis: Secondary | ICD-10-CM | POA: Diagnosis not present

## 2016-05-02 ENCOUNTER — Encounter: Payer: Self-pay | Admitting: *Deleted

## 2016-05-03 ENCOUNTER — Encounter: Payer: Self-pay | Admitting: Anesthesiology

## 2016-05-03 ENCOUNTER — Encounter
Admission: RE | Disposition: A | Discharge status: Home / Self Care | Payer: Self-pay | Source: Ambulatory Visit | Attending: Gastroenterology

## 2016-05-03 ENCOUNTER — Ambulatory Visit: Payer: PPO | Admitting: Anesthesiology

## 2016-05-03 ENCOUNTER — Ambulatory Visit
Admission: RE | Admit: 2016-05-03 | Discharge: 2016-05-03 | Disposition: A | Discharge status: Home / Self Care | Payer: PPO | Source: Ambulatory Visit | Attending: Gastroenterology | Admitting: Gastroenterology

## 2016-05-03 DIAGNOSIS — F419 Anxiety disorder, unspecified: Secondary | ICD-10-CM | POA: Insufficient documentation

## 2016-05-03 DIAGNOSIS — D120 Benign neoplasm of cecum: Secondary | ICD-10-CM | POA: Diagnosis not present

## 2016-05-03 DIAGNOSIS — E669 Obesity, unspecified: Secondary | ICD-10-CM | POA: Diagnosis not present

## 2016-05-03 DIAGNOSIS — E785 Hyperlipidemia, unspecified: Secondary | ICD-10-CM | POA: Diagnosis not present

## 2016-05-03 DIAGNOSIS — Z8673 Personal history of transient ischemic attack (TIA), and cerebral infarction without residual deficits: Secondary | ICD-10-CM | POA: Diagnosis not present

## 2016-05-03 DIAGNOSIS — Z888 Allergy status to other drugs, medicaments and biological substances status: Secondary | ICD-10-CM | POA: Diagnosis not present

## 2016-05-03 DIAGNOSIS — K219 Gastro-esophageal reflux disease without esophagitis: Secondary | ICD-10-CM | POA: Insufficient documentation

## 2016-05-03 DIAGNOSIS — F329 Major depressive disorder, single episode, unspecified: Secondary | ICD-10-CM | POA: Insufficient documentation

## 2016-05-03 DIAGNOSIS — K449 Diaphragmatic hernia without obstruction or gangrene: Secondary | ICD-10-CM | POA: Diagnosis not present

## 2016-05-03 DIAGNOSIS — I10 Essential (primary) hypertension: Secondary | ICD-10-CM | POA: Diagnosis not present

## 2016-05-03 DIAGNOSIS — K5649 Other impaction of intestine: Secondary | ICD-10-CM | POA: Diagnosis not present

## 2016-05-03 DIAGNOSIS — G43909 Migraine, unspecified, not intractable, without status migrainosus: Secondary | ICD-10-CM | POA: Diagnosis not present

## 2016-05-03 DIAGNOSIS — M549 Dorsalgia, unspecified: Secondary | ICD-10-CM | POA: Diagnosis not present

## 2016-05-03 DIAGNOSIS — R3129 Other microscopic hematuria: Secondary | ICD-10-CM | POA: Insufficient documentation

## 2016-05-03 DIAGNOSIS — J45909 Unspecified asthma, uncomplicated: Secondary | ICD-10-CM | POA: Insufficient documentation

## 2016-05-03 DIAGNOSIS — Z79899 Other long term (current) drug therapy: Secondary | ICD-10-CM | POA: Insufficient documentation

## 2016-05-03 DIAGNOSIS — K635 Polyp of colon: Secondary | ICD-10-CM | POA: Diagnosis not present

## 2016-05-03 DIAGNOSIS — G5600 Carpal tunnel syndrome, unspecified upper limb: Secondary | ICD-10-CM | POA: Diagnosis not present

## 2016-05-03 DIAGNOSIS — K5790 Diverticulosis of intestine, part unspecified, without perforation or abscess without bleeding: Secondary | ICD-10-CM | POA: Diagnosis not present

## 2016-05-03 DIAGNOSIS — Z885 Allergy status to narcotic agent status: Secondary | ICD-10-CM | POA: Insufficient documentation

## 2016-05-03 DIAGNOSIS — M1990 Unspecified osteoarthritis, unspecified site: Secondary | ICD-10-CM | POA: Insufficient documentation

## 2016-05-03 DIAGNOSIS — K5730 Diverticulosis of large intestine without perforation or abscess without bleeding: Secondary | ICD-10-CM | POA: Insufficient documentation

## 2016-05-03 DIAGNOSIS — R935 Abnormal findings on diagnostic imaging of other abdominal regions, including retroperitoneum: Secondary | ICD-10-CM | POA: Diagnosis not present

## 2016-05-03 DIAGNOSIS — K5289 Other specified noninfective gastroenteritis and colitis: Secondary | ICD-10-CM | POA: Diagnosis not present

## 2016-05-03 DIAGNOSIS — R933 Abnormal findings on diagnostic imaging of other parts of digestive tract: Secondary | ICD-10-CM | POA: Diagnosis not present

## 2016-05-03 HISTORY — PX: COLONOSCOPY WITH PROPOFOL: SHX5780

## 2016-05-03 SURGERY — COLONOSCOPY WITH PROPOFOL
Anesthesia: General

## 2016-05-03 MED ORDER — PROPOFOL 500 MG/50ML IV EMUL: INTRAVENOUS | Status: DC | PRN

## 2016-05-03 MED ORDER — SODIUM CHLORIDE 0.9 % IV SOLN: INTRAVENOUS | Status: DC

## 2016-05-03 MED ORDER — MIDAZOLAM HCL 2 MG/2ML IJ SOLN
INTRAMUSCULAR | Status: DC | PRN
  Administered 2016-05-03: 1 mg via INTRAVENOUS

## 2016-05-03 MED ORDER — SODIUM CHLORIDE 0.9 % IV SOLN
INTRAVENOUS | Status: DC
  Administered 2016-05-03: 1000 mL via INTRAVENOUS

## 2016-05-03 MED ORDER — PROPOFOL 500 MG/50ML IV EMUL
INTRAVENOUS | Status: DC | PRN
  Administered 2016-05-03: 100 ug/kg/min via INTRAVENOUS

## 2016-05-03 MED ORDER — EPHEDRINE SULFATE 50 MG/ML IJ SOLN
INTRAMUSCULAR | Status: DC | PRN
  Administered 2016-05-03 (×5): 10 mg via INTRAVENOUS

## 2016-05-03 MED ORDER — FENTANYL CITRATE (PF) 100 MCG/2ML IJ SOLN
INTRAMUSCULAR | Status: DC | PRN
  Administered 2016-05-03: 50 ug via INTRAVENOUS

## 2016-05-03 NOTE — Anesthesia Preprocedure Evaluation (Signed)
Anesthesia Evaluation  Patient identified by MRN, date of birth, ID band Patient awake    Reviewed: Allergy & Precautions, NPO status , Patient's Chart, lab work & pertinent test results  Airway Mallampati: II       Dental  (+) Teeth Intact   Pulmonary asthma ,    breath sounds clear to auscultation       Cardiovascular hypertension, Pt. on medications  Rhythm:Regular Rate:Normal     Neuro/Psych  Headaches, Anxiety Depression TIA   GI/Hepatic Neg liver ROS, hiatal hernia, GERD  Medicated,  Endo/Other    Renal/GU      Musculoskeletal   Abdominal (+) + obese,   Peds  Hematology negative hematology ROS (+)   Anesthesia Other Findings   Reproductive/Obstetrics                             Anesthesia Physical Anesthesia Plan  ASA: II  Anesthesia Plan: General   Post-op Pain Management:    Induction: Intravenous  Airway Management Planned: Natural Airway and Nasal Cannula  Additional Equipment:   Intra-op Plan:   Post-operative Plan:   Informed Consent: I have reviewed the patients History and Physical, chart, labs and discussed the procedure including the risks, benefits and alternatives for the proposed anesthesia with the patient or authorized representative who has indicated his/her understanding and acceptance.     Plan Discussed with: CRNA  Anesthesia Plan Comments:         Anesthesia Quick Evaluation

## 2016-05-03 NOTE — Op Note (Signed)
[facility] Gastroenterology Patient Name: Jackie Hunter Procedure Date: 05/03/2016 1:36 PM MRN: [id number] Account #: [id number] Date of Birth: 06-Dec-1945 Admit Type: Outpatient Age: 70 Room: [facility] ENDO ROOM 3 Gender: Female Note Status: Finalized Procedure:            Colonoscopy Indications:          Abnormal CT of the GI tract Providers:            Lollie Sails, MD Referring MD:         Tracie Harrier, MD (Referring MD) Medicines:            Monitored Anesthesia Care Complications:        No immediate complications. Procedure:            Pre-Anesthesia Assessment:                       - ASA Grade Assessment: II - A patient with mild                        systemic disease.                       After obtaining informed consent, the colonoscope was                        passed under direct vision. Throughout the procedure,                        the patient's blood pressure, pulse, and oxygen                        saturations were monitored continuously. The                        Colonoscope was introduced through the anus and                        advanced to the the cecum, identified by appendiceal                        orifice and ileocecal valve. The colonoscopy was                        performed with moderate difficulty due to significant                        looping and a tortuous colon. Successful completion of                        the procedure was aided by changing the patient to a                        supine position, changing the patient to a prone                        position and using manual pressure. The patient                        tolerated the procedure well. The quality of the bowel  preparation was fair. Findings:      Multiple small and large-mouthed diverticula were found in the entire       colon.      A single large-mouthed diverticulum was found in the proximal ascending       colon.  There was evidence of an impacted diverticulumThe wall appearing       thickened, with pus noted extruding from several smaller diverticuli.       Purulent discharge was seen in association with the diverticular       opening, consistent with diverticulitis. There is erosion at the edges       of the pocket with possible granulation tidssue. There is local swelling       and the lesion can only be seen by turning the distal fold with the tip       of the scope. This could not be opened long enough for biopsy. Impression:           - Preparation of the colon was fair.                       - Diverticulosis in the entire examined colon.                       - Marked diverticulosis in the proximal ascending                        colon. There was evidence of an impacted diverticulum,                        with Purulent discharge was seen in association with                        the diverticular opening, indicative of severe                        diverticulitis.                       - No specimens collected. Recommendation:       - Discharge patient to home.                       - Cipro (ciprofloxacin) 500 mg PO BID for 7 days.                       - Flagyl (metronidazole) 250 mg PO QID for 7 days. Procedure Code(s):    --- Professional ---                       [phone], Colonoscopy, flexible; diagnostic, including                        collection of specimen(s) by brushing or washing, when                        performed (separate procedure) Diagnosis Code(s):    --- Professional ---                       [id number], Diverticulitis of large intestine without  perforation or abscess without bleeding                       R93.3, Abnormal findings on diagnostic imaging of other                        parts of digestive tract CPT copyright 2016 [employer] Medical Association. All rights reserved. The codes documented in this report are preliminary and upon coder review  may  be revised to meet current compliance requirements. Lollie Sails, MD 05/03/2016 2:33:17 PM This report has been signed electronically. Number of Addenda: 0 Note Initiated On: 05/03/2016 1:36 PM Scope Withdrawal Time: 0 hours 23 minutes 27 seconds  Total Procedure Duration: 0 hours 37 minutes 56 seconds       [facility]

## 2016-05-03 NOTE — Anesthesia Postprocedure Evaluation (Signed)
Anesthesia Post Note  Patient: Jackie Hunter  Procedure(s) Performed: Procedure(s) (LRB): COLONOSCOPY WITH PROPOFOL (N/A)  Patient location during evaluation: PACU Anesthesia Type: General Level of consciousness: awake Pain management: pain level controlled Vital Signs Assessment: post-procedure vital signs reviewed and stable Respiratory status: spontaneous breathing Cardiovascular status: stable Anesthetic complications: no    Last Vitals:  Vitals:   05/03/16 1136 05/03/16 1426  BP: 103/62 (!) 94/50  Pulse: 72   Resp: 16   Temp: 36.2 C     Last Pain:  Vitals:   05/03/16 1136  TempSrc: Tympanic                 VAN STAVEREN,Sarabi Sockwell

## 2016-05-03 NOTE — H&P (Signed)
Outpatient short stay form Pre-procedure 05/03/2016 1:15 PM Lollie Sails MD  Primary Physician: Dr Tracie Harrier  Reason for visit:  Colonoscopy  History of present illness:  Patient is a 70 year old female presenting today as above. She has a history of microhematuria with some pelvic discomfort for about a year. She underwent a CT scan of the abdomen on 03/03/2016 for further evaluation of this issue. She was however found to have an incidental finding of asymmetric masslike thickening of the descending colon concerning for possible malignancy. She is presenting today for further evaluation. She tolerated her prep well. She denies use of any aspirin or blood thinning agents.    Current Facility-Administered Medications:  .  0.9 %  sodium chloride infusion, , Intravenous, Continuous, Lollie Sails, MD, Last Rate: 20 mL/hr at 05/03/16 1147, 1,000 mL at 05/03/16 1147 .  0.9 %  sodium chloride infusion, , Intravenous, Continuous, Lollie Sails, MD  Prescriptions Prior to Admission  Medication Sig Dispense Refill Last Dose  . albuterol (VENTOLIN HFA) 108 (90 Base) MCG/ACT inhaler Inhale 2 puffs into the lungs every 4 (four) hours as needed for wheezing or shortness of breath.   05/02/2016 at Unknown time  . gabapentin (NEURONTIN) 300 MG capsule Take 300 mg by mouth 3 (three) times daily.   05/02/2016 at Unknown time  . ibuprofen (ADVIL,MOTRIN) 200 MG tablet Take 200 mg by mouth every 6 (six) hours as needed.   Past Week at Unknown time  . lisinopril-hydrochlorothiazide (PRINZIDE,ZESTORETIC) 10-12.5 MG tablet Take 1 tablet by mouth daily.   05/03/2016 at 0600  . mometasone (NASONEX) 50 MCG/ACT nasal spray Place 2 sprays into the nose daily.   05/02/2016 at Unknown time  . omega-3 acid ethyl esters (LOVAZA) 1 g capsule Take by mouth 2 (two) times daily.   Past Week at Unknown time  . pantoprazole (PROTONIX) 40 MG tablet Take 40 mg by mouth daily.   Past Week at Unknown time  .  traMADol (ULTRAM) 50 MG tablet Take by mouth every 6 (six) hours as needed.   Past Month at Unknown time  . vitamin B-12 (CYANOCOBALAMIN) 1000 MCG tablet Take 1,000 mcg by mouth daily.   Past Month at Unknown time  . predniSONE (DELTASONE) 5 MG tablet    Not Taking at Unknown time     Allergies  Allergen Reactions  . Meloxicam   . Codeine Anxiety     Past Medical History:  Diagnosis Date  . Anxiety   . Arthritis   . Asthma   . Back pain   . Carpal tunnel syndrome   . Cervicalgia   . GERD (gastroesophageal reflux disease)   . Headache   . History of hiatal hernia   . Hypertension   . Lipids serum increased   . Lumbago   . Migraines   . Obesity   . Stroke ([place])   . TIA (transient ischemic attack)     Review of systems:      Physical Exam    Heart and lungs: Regular rate and rhythm without rub or gallop, lungs are bilaterally clear.    HEENT: Normocephalic atraumatic eyes are anicteric    Other:     Pertinant exam for procedure: Soft nontender nondistended bowel sounds positive normoactive.    Planned proceedures: Colonoscopy and indicated procedures. I have discussed the risks benefits and complications of procedures to include not limited to bleeding, infection, perforation and the risk of sedation and the patient wishes to proceed.  Lollie Sails, MD Gastroenterology 05/03/2016  1:15 PM

## 2016-05-03 NOTE — Anesthesia Procedure Notes (Signed)
Performed by: COOK-MARTIN, Laretha Luepke Pre-anesthesia Checklist: Patient identified, Emergency Drugs available, Suction available, Patient being monitored and Timeout performed Patient Re-evaluated:Patient Re-evaluated prior to inductionOxygen Delivery Method: Nasal cannula Preoxygenation: Pre-oxygenation with 100% oxygen Intubation Type: IV induction Placement Confirmation: CO2 detector and positive ETCO2       

## 2016-05-03 NOTE — Transfer of Care (Signed)
Immediate Anesthesia Transfer of Care Note  Patient: Jackie Hunter  Procedure(s) Performed: Procedure(s): COLONOSCOPY WITH PROPOFOL (N/A)  Patient Location: PACU  Anesthesia Type:General  Level of Consciousness: awake and sedated  Airway & Oxygen Therapy: Patient Spontanous Breathing and Patient connected to nasal cannula oxygen  Post-op Assessment: Report given to RN and Post -op Vital signs reviewed and stable  Post vital signs: Reviewed and stable  Last Vitals:  Vitals:   05/03/16 1136 05/03/16 1426  BP: 103/62 (!) 94/50  Pulse: 72   Resp: 16   Temp: 36.2 C     Last Pain:  Vitals:   05/03/16 1136  TempSrc: Tympanic         Complications: No apparent anesthesia complications

## 2016-05-04 LAB — CEA: CEA: 2.3 ng/mL (ref 0.0–4.7)

## 2016-05-05 LAB — SURGICAL PATHOLOGY

## 2016-05-07 ENCOUNTER — Encounter: Payer: Self-pay | Admitting: Gastroenterology

## 2016-05-18 DIAGNOSIS — M5416 Radiculopathy, lumbar region: Secondary | ICD-10-CM | POA: Diagnosis not present

## 2016-05-18 DIAGNOSIS — M48062 Spinal stenosis, lumbar region with neurogenic claudication: Secondary | ICD-10-CM | POA: Diagnosis not present

## 2016-05-18 DIAGNOSIS — M5136 Other intervertebral disc degeneration, lumbar region: Secondary | ICD-10-CM | POA: Diagnosis not present

## 2016-05-19 ENCOUNTER — Ambulatory Visit: Payer: Medicare PPO

## 2016-05-24 DIAGNOSIS — K5732 Diverticulitis of large intestine without perforation or abscess without bleeding: Secondary | ICD-10-CM | POA: Diagnosis not present

## 2016-05-24 DIAGNOSIS — R935 Abnormal findings on diagnostic imaging of other abdominal regions, including retroperitoneum: Secondary | ICD-10-CM | POA: Diagnosis not present

## 2016-05-24 DIAGNOSIS — Z23 Encounter for immunization: Secondary | ICD-10-CM | POA: Diagnosis not present

## 2016-05-24 DIAGNOSIS — K5730 Diverticulosis of large intestine without perforation or abscess without bleeding: Secondary | ICD-10-CM | POA: Diagnosis not present

## 2016-05-25 ENCOUNTER — Other Ambulatory Visit: Payer: Self-pay | Admitting: Gastroenterology

## 2016-05-25 DIAGNOSIS — K5732 Diverticulitis of large intestine without perforation or abscess without bleeding: Secondary | ICD-10-CM

## 2016-07-28 ENCOUNTER — Ambulatory Visit: Admission: RE | Admit: 2016-07-28 | Payer: PPO | Source: Ambulatory Visit

## 2016-08-04 ENCOUNTER — Ambulatory Visit: Admission: RE | Admit: 2016-08-04 | Payer: PPO | Source: Ambulatory Visit

## 2016-08-08 ENCOUNTER — Ambulatory Visit
Admission: RE | Admit: 2016-08-08 | Discharge: 2016-08-08 | Disposition: A | Discharge status: Home / Self Care | Payer: PPO | Source: Ambulatory Visit | Attending: Gastroenterology | Admitting: Gastroenterology

## 2016-08-08 DIAGNOSIS — N200 Calculus of kidney: Secondary | ICD-10-CM | POA: Diagnosis not present

## 2016-08-08 DIAGNOSIS — R935 Abnormal findings on diagnostic imaging of other abdominal regions, including retroperitoneum: Secondary | ICD-10-CM | POA: Diagnosis not present

## 2016-08-08 DIAGNOSIS — N289 Disorder of kidney and ureter, unspecified: Secondary | ICD-10-CM | POA: Insufficient documentation

## 2016-08-08 DIAGNOSIS — K5732 Diverticulitis of large intestine without perforation or abscess without bleeding: Secondary | ICD-10-CM | POA: Insufficient documentation

## 2016-08-08 DIAGNOSIS — K5790 Diverticulosis of intestine, part unspecified, without perforation or abscess without bleeding: Secondary | ICD-10-CM | POA: Diagnosis not present

## 2016-08-08 LAB — POCT I-STAT CREATININE: Creatinine, Ser: 1.6 mg/dL — ABNORMAL HIGH (ref 0.44–1.00)

## 2016-08-08 MED ORDER — IOPAMIDOL (ISOVUE-300) INJECTION 61%: 100.0000 mL | Freq: Once | INTRAVENOUS | Status: DC | PRN

## 2016-08-08 MED ORDER — IOPAMIDOL (ISOVUE-300) INJECTION 61%
75.0000 mL | Freq: Once | INTRAVENOUS | Status: AC | PRN
  Administered 2016-08-08: 75 mL via INTRAVENOUS

## 2016-08-25 DIAGNOSIS — K5730 Diverticulosis of large intestine without perforation or abscess without bleeding: Secondary | ICD-10-CM | POA: Diagnosis not present

## 2016-08-25 DIAGNOSIS — Z0000 Encounter for general adult medical examination without abnormal findings: Secondary | ICD-10-CM | POA: Diagnosis not present

## 2016-08-25 DIAGNOSIS — F418 Other specified anxiety disorders: Secondary | ICD-10-CM | POA: Diagnosis not present

## 2016-08-25 DIAGNOSIS — M5416 Radiculopathy, lumbar region: Secondary | ICD-10-CM | POA: Diagnosis not present

## 2016-08-25 DIAGNOSIS — I10 Essential (primary) hypertension: Secondary | ICD-10-CM | POA: Diagnosis not present

## 2016-08-25 DIAGNOSIS — Z1231 Encounter for screening mammogram for malignant neoplasm of breast: Secondary | ICD-10-CM | POA: Diagnosis not present

## 2016-08-25 DIAGNOSIS — R739 Hyperglycemia, unspecified: Secondary | ICD-10-CM | POA: Diagnosis not present

## 2016-09-19 DIAGNOSIS — J209 Acute bronchitis, unspecified: Secondary | ICD-10-CM | POA: Diagnosis not present

## 2016-09-19 DIAGNOSIS — R05 Cough: Secondary | ICD-10-CM | POA: Diagnosis not present

## 2016-09-28 DIAGNOSIS — I10 Essential (primary) hypertension: Secondary | ICD-10-CM | POA: Diagnosis not present

## 2016-09-28 DIAGNOSIS — J208 Acute bronchitis due to other specified organisms: Secondary | ICD-10-CM | POA: Diagnosis not present

## 2016-09-28 DIAGNOSIS — R531 Weakness: Secondary | ICD-10-CM | POA: Diagnosis not present

## 2016-10-06 DIAGNOSIS — R42 Dizziness and giddiness: Secondary | ICD-10-CM | POA: Diagnosis not present

## 2016-10-06 DIAGNOSIS — H903 Sensorineural hearing loss, bilateral: Secondary | ICD-10-CM | POA: Diagnosis not present

## 2016-10-06 DIAGNOSIS — H9209 Otalgia, unspecified ear: Secondary | ICD-10-CM | POA: Diagnosis not present

## 2016-10-21 ENCOUNTER — Ambulatory Visit
Admission: RE | Admit: 2016-10-21 | Discharge: 2016-10-21 | Disposition: A | Discharge status: Home / Self Care | Payer: PPO | Source: Ambulatory Visit | Attending: Internal Medicine | Admitting: Internal Medicine

## 2016-10-21 DIAGNOSIS — Z1231 Encounter for screening mammogram for malignant neoplasm of breast: Secondary | ICD-10-CM | POA: Insufficient documentation

## 2016-10-21 DIAGNOSIS — R928 Other abnormal and inconclusive findings on diagnostic imaging of breast: Secondary | ICD-10-CM | POA: Insufficient documentation

## 2016-10-26 ENCOUNTER — Other Ambulatory Visit: Payer: Self-pay | Admitting: Internal Medicine

## 2016-10-26 DIAGNOSIS — R928 Other abnormal and inconclusive findings on diagnostic imaging of breast: Secondary | ICD-10-CM

## 2016-10-26 DIAGNOSIS — N6310 Unspecified lump in the right breast, unspecified quadrant: Secondary | ICD-10-CM

## 2016-11-01 ENCOUNTER — Ambulatory Visit: Payer: PPO | Admitting: Anesthesiology

## 2016-11-01 ENCOUNTER — Ambulatory Visit
Admission: RE | Admit: 2016-11-01 | Discharge: 2016-11-01 | Disposition: A | Discharge status: Home / Self Care | Payer: PPO | Source: Ambulatory Visit | Attending: Gastroenterology | Admitting: Gastroenterology

## 2016-11-01 ENCOUNTER — Encounter: Payer: Self-pay | Admitting: *Deleted

## 2016-11-01 ENCOUNTER — Encounter
Admission: RE | Disposition: A | Discharge status: Home / Self Care | Payer: Self-pay | Source: Ambulatory Visit | Attending: Gastroenterology

## 2016-11-01 DIAGNOSIS — K219 Gastro-esophageal reflux disease without esophagitis: Secondary | ICD-10-CM | POA: Insufficient documentation

## 2016-11-01 DIAGNOSIS — M542 Cervicalgia: Secondary | ICD-10-CM | POA: Diagnosis not present

## 2016-11-01 DIAGNOSIS — F419 Anxiety disorder, unspecified: Secondary | ICD-10-CM | POA: Insufficient documentation

## 2016-11-01 DIAGNOSIS — N183 Chronic kidney disease, stage 3 (moderate): Secondary | ICD-10-CM | POA: Diagnosis not present

## 2016-11-01 DIAGNOSIS — E785 Hyperlipidemia, unspecified: Secondary | ICD-10-CM | POA: Diagnosis not present

## 2016-11-01 DIAGNOSIS — R933 Abnormal findings on diagnostic imaging of other parts of digestive tract: Secondary | ICD-10-CM | POA: Diagnosis not present

## 2016-11-01 DIAGNOSIS — Z885 Allergy status to narcotic agent status: Secondary | ICD-10-CM | POA: Insufficient documentation

## 2016-11-01 DIAGNOSIS — Z79899 Other long term (current) drug therapy: Secondary | ICD-10-CM | POA: Insufficient documentation

## 2016-11-01 DIAGNOSIS — G43909 Migraine, unspecified, not intractable, without status migrainosus: Secondary | ICD-10-CM | POA: Diagnosis not present

## 2016-11-01 DIAGNOSIS — E669 Obesity, unspecified: Secondary | ICD-10-CM | POA: Diagnosis not present

## 2016-11-01 DIAGNOSIS — G5600 Carpal tunnel syndrome, unspecified upper limb: Secondary | ICD-10-CM | POA: Diagnosis not present

## 2016-11-01 DIAGNOSIS — Z5309 Procedure and treatment not carried out because of other contraindication: Secondary | ICD-10-CM | POA: Insufficient documentation

## 2016-11-01 DIAGNOSIS — Z86010 Personal history of colonic polyps: Secondary | ICD-10-CM | POA: Diagnosis not present

## 2016-11-01 DIAGNOSIS — M1990 Unspecified osteoarthritis, unspecified site: Secondary | ICD-10-CM | POA: Diagnosis not present

## 2016-11-01 DIAGNOSIS — J45909 Unspecified asthma, uncomplicated: Secondary | ICD-10-CM | POA: Diagnosis not present

## 2016-11-01 DIAGNOSIS — Z888 Allergy status to other drugs, medicaments and biological substances status: Secondary | ICD-10-CM | POA: Diagnosis not present

## 2016-11-01 DIAGNOSIS — Z538 Procedure and treatment not carried out for other reasons: Secondary | ICD-10-CM | POA: Diagnosis not present

## 2016-11-01 DIAGNOSIS — I129 Hypertensive chronic kidney disease with stage 1 through stage 4 chronic kidney disease, or unspecified chronic kidney disease: Secondary | ICD-10-CM | POA: Insufficient documentation

## 2016-11-01 DIAGNOSIS — Z6830 Body mass index (BMI) 30.0-30.9, adult: Secondary | ICD-10-CM | POA: Diagnosis not present

## 2016-11-01 DIAGNOSIS — J309 Allergic rhinitis, unspecified: Secondary | ICD-10-CM | POA: Insufficient documentation

## 2016-11-01 DIAGNOSIS — M5136 Other intervertebral disc degeneration, lumbar region: Secondary | ICD-10-CM | POA: Diagnosis not present

## 2016-11-01 DIAGNOSIS — Z8673 Personal history of transient ischemic attack (TIA), and cerebral infarction without residual deficits: Secondary | ICD-10-CM | POA: Diagnosis not present

## 2016-11-01 DIAGNOSIS — K449 Diaphragmatic hernia without obstruction or gangrene: Secondary | ICD-10-CM | POA: Diagnosis not present

## 2016-11-01 DIAGNOSIS — I10 Essential (primary) hypertension: Secondary | ICD-10-CM | POA: Diagnosis not present

## 2016-11-01 HISTORY — DX: Unspecified osteoarthritis, unspecified site: M19.90

## 2016-11-01 HISTORY — DX: Fracture of unspecified carpal bone, unspecified wrist, initial encounter for closed fracture: S62.109A

## 2016-11-01 HISTORY — DX: Chronic kidney disease, unspecified: N18.9

## 2016-11-01 HISTORY — DX: Other intervertebral disc degeneration, lumbar region: M51.36

## 2016-11-01 HISTORY — PX: COLONOSCOPY WITH PROPOFOL: SHX5780

## 2016-11-01 HISTORY — DX: Other intervertebral disc degeneration, lumbar region without mention of lumbar back pain or lower extremity pain: M51.369

## 2016-11-01 HISTORY — DX: Allergic rhinitis, unspecified: J30.9

## 2016-11-01 HISTORY — DX: Benign neoplasm of colon, unspecified: D12.6

## 2016-11-01 SURGERY — COLONOSCOPY WITH PROPOFOL
Anesthesia: General

## 2016-11-01 MED ORDER — PROPOFOL 10 MG/ML IV BOLUS
INTRAVENOUS | Status: AC
  Filled 2016-11-01: qty 20

## 2016-11-01 MED ORDER — SODIUM CHLORIDE 0.9 % IV SOLN
INTRAVENOUS | Status: DC
  Administered 2016-11-01: 09:00:00 via INTRAVENOUS

## 2016-11-01 MED ORDER — SODIUM CHLORIDE 0.9 % IV SOLN: INTRAVENOUS | Status: DC

## 2016-11-01 MED ORDER — PROPOFOL 10 MG/ML IV BOLUS
INTRAVENOUS | Status: DC | PRN
  Administered 2016-11-01: 80 mg via INTRAVENOUS

## 2016-11-01 NOTE — Op Note (Signed)
[facility] Gastroenterology Patient Name: Jackie Hunter Procedure Date: 11/01/2016 9:46 AM MRN: [id number] Account #: [id number] Date of Birth: September 10, 1945 Admit Type: Outpatient Age: 71 Room: [facility] ENDO ROOM 1 Gender: Female Note Status: Finalized Procedure:            Colonoscopy Indications:          Abnormal CT of the GI tract, followup of abnormal                        colonoscopy Providers:            Lollie Sails, MD Referring MD:         Tracie Harrier, MD (Referring MD) Medicines:            Monitored Anesthesia Care Complications:        No immediate complications. Procedure:            Pre-Anesthesia Assessment:                       - ASA Grade Assessment: III - A patient with severe                        systemic disease.                       After obtaining informed consent, the colonoscope was                        passed under direct vision. Throughout the procedure,                        the patient's blood pressure, pulse, and oxygen                        saturations were monitored continuously. The                        Colonoscope was introduced through the anus with the                        intention of advancing to the cecum. The scope was                        advanced to the sigmoid colon before the procedure was                        aborted. Medications were given. The quality of the                        bowel preparation was poor. Findings:      A large amount of semi-liquid stool was found in the recto-sigmoid       colon, precluding visualization. Impression:           - Preparation of the colon was poor.                       - Stool in the recto-sigmoid colon.                       - No specimens collected. Recommendation:       - Put patient  on a clear liquid diet starting today.                       - repeat prep and reschedule Procedure Code(s):    --- Professional ---                       [phone], 53,  Colonoscopy, flexible; diagnostic, including                        collection of specimen(s) by brushing or washing, when                        performed (separate procedure) Diagnosis Code(s):    --- Professional ---                       R93.3, Abnormal findings on diagnostic imaging of other                        parts of digestive tract CPT copyright 2016 [employer]. All rights reserved. The codes documented in this report are preliminary and upon coder review may  be revised to meet current compliance requirements. Lollie Sails, MD 11/01/2016 10:09:55 AM This report has been signed electronically. Number of Addenda: 0 Note Initiated On: 11/01/2016 9:46 AM Total Procedure Duration: 0 hours 5 minutes 9 seconds       [facility]

## 2016-11-01 NOTE — Transfer of Care (Signed)
Immediate Anesthesia Transfer of Care Note  Patient: Jackie Hunter  Procedure(s) Performed: Procedure(s): COLONOSCOPY WITH PROPOFOL (N/A)  Patient Location: PACU and Endoscopy Unit  Anesthesia Type:General  Level of Consciousness: awake, alert  and oriented  Airway & Oxygen Therapy: Patient Spontanous Breathing and Patient connected to nasal cannula oxygen  Post-op Assessment: Report given to RN and Post -op Vital signs reviewed and stable  Post vital signs: Reviewed and stable  Last Vitals:  Vitals:   11/01/16 0848 11/01/16 1009  BP: (!) 123/59 (!) 108/53  Pulse: 65 (!) 58  Resp: 16 15  Temp: (!) 35.8 C 36.4 C    Last Pain:  Vitals:   11/01/16 1009  TempSrc: Tympanic         Complications: No apparent anesthesia complications

## 2016-11-01 NOTE — Anesthesia Preprocedure Evaluation (Signed)
Anesthesia Evaluation  Patient identified by MRN, date of birth, ID band Patient awake    Reviewed: Allergy & Precautions, H&P , NPO status , Patient's Chart, lab work & pertinent test results, reviewed documented beta blocker date and time   Airway Mallampati: II   Neck ROM: full    Dental  (+) Poor Dentition, Teeth Intact   Pulmonary neg pulmonary ROS, asthma ,    Pulmonary exam normal        Cardiovascular hypertension, negative cardio ROS Normal cardiovascular exam Rhythm:regular Rate:Normal     Neuro/Psych  Headaches, PSYCHIATRIC DISORDERS TIA Neuromuscular disease CVA, No Residual Symptoms negative neurological ROS  negative psych ROS   GI/Hepatic negative GI ROS, Neg liver ROS, hiatal hernia, GERD  Medicated,  Endo/Other  negative endocrine ROS  Renal/GU Renal diseasenegative Renal ROS  negative genitourinary   Musculoskeletal   Abdominal   Peds  Hematology negative hematology ROS (+)   Anesthesia Other Findings Past Medical History: No date: Adenoma of colon No date: Allergic rhinitis No date: Anxiety No date: Arthritis No date: Asthma No date: Back pain No date: Carpal tunnel syndrome No date: Cervicalgia No date: Chronic kidney disease     Comment: STAGE 3 No date: DDD (degenerative disc disease), lumbar No date: GERD (gastroesophageal reflux disease) No date: Headache No date: History of hiatal hernia No date: Hypertension No date: Lipids serum increased No date: Lumbago No date: Migraines No date: Obesity No date: Osteoarthritis No date: Stroke [facility]) No date: TIA (transient ischemic attack) No date: Wrist fracture Past Surgical History: 2014: BREAST BIOPSY Right     Comment: NEG 1967: BREAST EXCISIONAL BIOPSY Left     Comment: NEG No date: cataracts Bilateral 05/03/2016: COLONOSCOPY WITH PROPOFOL N/A     Comment: Procedure: COLONOSCOPY WITH PROPOFOL;                Surgeon: Lollie Sails, MD;  Location: [facility]              ENDOSCOPY;  Service: Endoscopy;  Laterality:               N/A; 10/26/2015: ESOPHAGOGASTRODUODENOSCOPY (EGD) WITH PROPOFOL N/A     Comment: Procedure: ESOPHAGOGASTRODUODENOSCOPY (EGD)               WITH PROPOFOL;  Surgeon: [name], MD;                Location: [facility] ENDOSCOPY;  Service:               Gastroenterology;  Laterality: N/A; No date: EYE SURGERY No date: OOPHORECTOMY     Comment: left   Reproductive/Obstetrics negative OB ROS                             Anesthesia Physical Anesthesia Plan  ASA: III  Anesthesia Plan: General   Post-op Pain Management:    Induction:   Airway Management Planned:   Additional Equipment:   Intra-op Plan:   Post-operative Plan:   Informed Consent: I have reviewed the patients History and Physical, chart, labs and discussed the procedure including the risks, benefits and alternatives for the proposed anesthesia with the patient or authorized representative who has indicated his/her understanding and acceptance.   Dental Advisory Given  Plan Discussed with: CRNA  Anesthesia Plan Comments:         Anesthesia Quick Evaluation

## 2016-11-01 NOTE — Anesthesia Post-op Follow-up Note (Cosign Needed)
Anesthesia QCDR form completed.        

## 2016-11-01 NOTE — H&P (Signed)
Outpatient short stay form Pre-procedure 11/01/2016 9:47 AM Lollie Sails MD  Primary Physician: Dr Tracie Harrier  Reason for visit: colonoscopy  History of present illness:  Patient is a 71 yo f presenting for repeat colonoscopy.  She had a colonoscopy on 10/17 2017 That showed a impacted diverticulum in the ascending colon with some purulence noted as well.  She was treated with antibiotics.  CEA was normal.  She is presenting for repeat check today.  Sshe tolerated the prep she takes no blood thinners or ASA products.  She has had an interim CT scan that is raising concern for possible neoplastic lesion in that region.      Current Facility-Administered Medications:  .  0.9 %  sodium chloride infusion, , Intravenous, Continuous, Lollie Sails, MD, Last Rate: 20 mL/hr at 11/01/16 0859 .  0.9 %  sodium chloride infusion, , Intravenous, Continuous, Lollie Sails, MD  Prescriptions Prior to Admission  Medication Sig Dispense Refill Last Dose  . lisinopril-hydrochlorothiazide (PRINZIDE,ZESTORETIC) 10-12.5 MG tablet Take 1 tablet by mouth daily.   11/01/2016 at Unknown time  . pantoprazole (PROTONIX) 40 MG tablet Take 40 mg by mouth daily.   10/31/2016 at Unknown time  . albuterol (VENTOLIN HFA) 108 (90 Base) MCG/ACT inhaler Inhale 2 puffs into the lungs every 4 (four) hours as needed for wheezing or shortness of breath.   05/02/2016 at Unknown time  . gabapentin (NEURONTIN) 300 MG capsule Take 300 mg by mouth 3 (three) times daily.   05/02/2016 at Unknown time  . ibuprofen (ADVIL,MOTRIN) 200 MG tablet Take 200 mg by mouth every 6 (six) hours as needed.   Past Week at Unknown time  . mometasone (NASONEX) 50 MCG/ACT nasal spray Place 2 sprays into the nose daily.   05/02/2016 at Unknown time  . omega-3 acid ethyl esters (LOVAZA) 1 g capsule Take by mouth 2 (two) times daily.   Past Week at Unknown time  . predniSONE (DELTASONE) 5 MG tablet    Not Taking at Unknown time  . traMADol  (ULTRAM) 50 MG tablet Take by mouth every 6 (six) hours as needed.   Past Month at Unknown time  . vitamin B-12 (CYANOCOBALAMIN) 1000 MCG tablet Take 1,000 mcg by mouth daily.   Past Month at Unknown time     Allergies  Allergen Reactions  . Levaquin [Levofloxacin In D5w] Other (See Comments)    dizziness  . Meloxicam   . Codeine Anxiety     Past Medical History:  Diagnosis Date  . Adenoma of colon   . Allergic rhinitis   . Anxiety   . Arthritis   . Asthma   . Back pain   . Carpal tunnel syndrome   . Cervicalgia   . Chronic kidney disease    STAGE 3  . DDD (degenerative disc disease), lumbar   . GERD (gastroesophageal reflux disease)   . Headache   . History of hiatal hernia   . Hypertension   . Lipids serum increased   . Lumbago   . Migraines   . Obesity   . Osteoarthritis   . Stroke ([place])   . TIA (transient ischemic attack)   . Wrist fracture     Review of systems:      Physical Exam    Heart and lungs: rrr    HEENT: ncat    Other:     Pertinant exam for procedure: soft and.nt bowel sounds positive     Planned proceedures: colonoscopy and  indicated procedures.  I have discussed the risks benefits and complications of procedures to include not limited to bleeding, infection, perforation and the risk of sedation and the patient wishes to proceed.    Lollie Sails, MD Gastroenterology 11/01/2016  9:47 AM

## 2016-11-02 ENCOUNTER — Encounter: Payer: Self-pay | Admitting: Gastroenterology

## 2016-11-02 ENCOUNTER — Ambulatory Visit
Admission: RE | Admit: 2016-11-02 | Discharge: 2016-11-02 | Disposition: A | Discharge status: Home / Self Care | Payer: PPO | Source: Ambulatory Visit | Attending: Gastroenterology | Admitting: Gastroenterology

## 2016-11-02 ENCOUNTER — Encounter
Admission: RE | Disposition: A | Discharge status: Home / Self Care | Payer: Self-pay | Source: Ambulatory Visit | Attending: Gastroenterology

## 2016-11-02 ENCOUNTER — Ambulatory Visit: Payer: PPO | Admitting: Anesthesiology

## 2016-11-02 DIAGNOSIS — Z885 Allergy status to narcotic agent status: Secondary | ICD-10-CM | POA: Insufficient documentation

## 2016-11-02 DIAGNOSIS — K449 Diaphragmatic hernia without obstruction or gangrene: Secondary | ICD-10-CM | POA: Diagnosis not present

## 2016-11-02 DIAGNOSIS — M542 Cervicalgia: Secondary | ICD-10-CM | POA: Insufficient documentation

## 2016-11-02 DIAGNOSIS — N183 Chronic kidney disease, stage 3 (moderate): Secondary | ICD-10-CM | POA: Diagnosis not present

## 2016-11-02 DIAGNOSIS — R933 Abnormal findings on diagnostic imaging of other parts of digestive tract: Secondary | ICD-10-CM | POA: Diagnosis not present

## 2016-11-02 DIAGNOSIS — Z86010 Personal history of colonic polyps: Secondary | ICD-10-CM | POA: Insufficient documentation

## 2016-11-02 DIAGNOSIS — Z888 Allergy status to other drugs, medicaments and biological substances status: Secondary | ICD-10-CM | POA: Diagnosis not present

## 2016-11-02 DIAGNOSIS — K219 Gastro-esophageal reflux disease without esophagitis: Secondary | ICD-10-CM | POA: Diagnosis not present

## 2016-11-02 DIAGNOSIS — Z6830 Body mass index (BMI) 30.0-30.9, adult: Secondary | ICD-10-CM | POA: Insufficient documentation

## 2016-11-02 DIAGNOSIS — Z8673 Personal history of transient ischemic attack (TIA), and cerebral infarction without residual deficits: Secondary | ICD-10-CM | POA: Diagnosis not present

## 2016-11-02 DIAGNOSIS — G5600 Carpal tunnel syndrome, unspecified upper limb: Secondary | ICD-10-CM | POA: Diagnosis not present

## 2016-11-02 DIAGNOSIS — K5730 Diverticulosis of large intestine without perforation or abscess without bleeding: Secondary | ICD-10-CM | POA: Insufficient documentation

## 2016-11-02 DIAGNOSIS — M5136 Other intervertebral disc degeneration, lumbar region: Secondary | ICD-10-CM | POA: Diagnosis not present

## 2016-11-02 DIAGNOSIS — Z79899 Other long term (current) drug therapy: Secondary | ICD-10-CM | POA: Diagnosis not present

## 2016-11-02 DIAGNOSIS — J45909 Unspecified asthma, uncomplicated: Secondary | ICD-10-CM | POA: Diagnosis not present

## 2016-11-02 DIAGNOSIS — M549 Dorsalgia, unspecified: Secondary | ICD-10-CM | POA: Insufficient documentation

## 2016-11-02 DIAGNOSIS — F419 Anxiety disorder, unspecified: Secondary | ICD-10-CM | POA: Diagnosis not present

## 2016-11-02 DIAGNOSIS — K5790 Diverticulosis of intestine, part unspecified, without perforation or abscess without bleeding: Secondary | ICD-10-CM | POA: Diagnosis not present

## 2016-11-02 DIAGNOSIS — M545 Low back pain: Secondary | ICD-10-CM | POA: Diagnosis not present

## 2016-11-02 DIAGNOSIS — D175 Benign lipomatous neoplasm of intra-abdominal organs: Secondary | ICD-10-CM | POA: Insufficient documentation

## 2016-11-02 DIAGNOSIS — M1990 Unspecified osteoarthritis, unspecified site: Secondary | ICD-10-CM | POA: Insufficient documentation

## 2016-11-02 DIAGNOSIS — E669 Obesity, unspecified: Secondary | ICD-10-CM | POA: Diagnosis not present

## 2016-11-02 DIAGNOSIS — G43909 Migraine, unspecified, not intractable, without status migrainosus: Secondary | ICD-10-CM | POA: Insufficient documentation

## 2016-11-02 DIAGNOSIS — K5732 Diverticulitis of large intestine without perforation or abscess without bleeding: Secondary | ICD-10-CM | POA: Diagnosis not present

## 2016-11-02 DIAGNOSIS — D179 Benign lipomatous neoplasm, unspecified: Secondary | ICD-10-CM | POA: Diagnosis not present

## 2016-11-02 DIAGNOSIS — I10 Essential (primary) hypertension: Secondary | ICD-10-CM | POA: Diagnosis not present

## 2016-11-02 DIAGNOSIS — I129 Hypertensive chronic kidney disease with stage 1 through stage 4 chronic kidney disease, or unspecified chronic kidney disease: Secondary | ICD-10-CM | POA: Insufficient documentation

## 2016-11-02 DIAGNOSIS — E785 Hyperlipidemia, unspecified: Secondary | ICD-10-CM | POA: Diagnosis not present

## 2016-11-02 HISTORY — PX: COLONOSCOPY WITH PROPOFOL: SHX5780

## 2016-11-02 SURGERY — COLONOSCOPY WITH PROPOFOL
Anesthesia: General

## 2016-11-02 MED ORDER — PROPOFOL 10 MG/ML IV BOLUS
INTRAVENOUS | Status: AC
  Filled 2016-11-02: qty 20

## 2016-11-02 MED ORDER — SODIUM CHLORIDE 0.9 % IV SOLN
INTRAVENOUS | Status: DC
  Administered 2016-11-02: 13:00:00 via INTRAVENOUS

## 2016-11-02 MED ORDER — PROPOFOL 500 MG/50ML IV EMUL
INTRAVENOUS | Status: AC
Start: 2016-11-02 — End: 2016-11-02
  Filled 2016-11-02: qty 50

## 2016-11-02 MED ORDER — PROPOFOL 10 MG/ML IV BOLUS
INTRAVENOUS | Status: AC
Start: 2016-11-02 — End: 2016-11-02
  Filled 2016-11-02: qty 20

## 2016-11-02 MED ORDER — PROPOFOL 500 MG/50ML IV EMUL
INTRAVENOUS | Status: DC | PRN
  Administered 2016-11-02: 175 ug/kg/min via INTRAVENOUS

## 2016-11-02 MED ORDER — PROPOFOL 10 MG/ML IV BOLUS
INTRAVENOUS | Status: DC | PRN
  Administered 2016-11-02: 50 mg via INTRAVENOUS

## 2016-11-02 NOTE — Transfer of Care (Signed)
Immediate Anesthesia Transfer of Care Note  Patient: Jackie Hunter  Procedure(s) Performed: Procedure(s): COLONOSCOPY WITH PROPOFOL (N/A)  Patient Location: PACU and Endoscopy Unit  Anesthesia Type:General  Level of Consciousness: sedated  Airway & Oxygen Therapy: Patient Spontanous Breathing and Patient connected to nasal cannula oxygen  Post-op Assessment: Report given to RN and Post -op Vital signs reviewed and stable  Post vital signs: Reviewed and stable  Last Vitals:  Vitals:   11/02/16 1404 11/02/16 1413  BP: 127/65 130/61  Pulse: (!) 57 (!) 56  Resp: 16 15  Temp: (!) 41.9 C     Complications: No apparent anesthesia complications

## 2016-11-02 NOTE — Anesthesia Preprocedure Evaluation (Signed)
Anesthesia Evaluation  Patient identified by MRN, date of birth, ID band Patient awake    Reviewed: Allergy & Precautions, NPO status , Patient's Chart, lab work & pertinent test results, reviewed documented beta blocker date and time   Airway Mallampati: II  TM Distance: >3 FB     Dental  (+) Chipped   Pulmonary asthma ,           Cardiovascular hypertension, Pt. on medications      Neuro/Psych  Headaches, PSYCHIATRIC DISORDERS Anxiety Depression TIA Neuromuscular disease CVA    GI/Hepatic hiatal hernia, GERD  ,  Endo/Other    Renal/GU Renal disease     Musculoskeletal  (+) Arthritis ,   Abdominal   Peds  Hematology   Anesthesia Other Findings   Reproductive/Obstetrics                             Anesthesia Physical Anesthesia Plan  ASA: III  Anesthesia Plan: General   Post-op Pain Management:    Induction: Intravenous  Airway Management Planned:   Additional Equipment:   Intra-op Plan:   Post-operative Plan:   Informed Consent: I have reviewed the patients History and Physical, chart, labs and discussed the procedure including the risks, benefits and alternatives for the proposed anesthesia with the patient or authorized representative who has indicated his/her understanding and acceptance.     Plan Discussed with: CRNA  Anesthesia Plan Comments:         Anesthesia Quick Evaluation

## 2016-11-02 NOTE — Anesthesia Procedure Notes (Signed)
Date/Time: 11/02/2016 1:25 PM Performed by: Doreen Salvage Pre-anesthesia Checklist: Patient identified, Emergency Drugs available, Suction available and Patient being monitored Patient Re-evaluated:Patient Re-evaluated prior to inductionOxygen Delivery Method: Nasal cannula Intubation Type: IV induction Dental Injury: Teeth and Oropharynx as per pre-operative assessment  Comments: Nasal cannula with etCO2 monitoring

## 2016-11-02 NOTE — Anesthesia Postprocedure Evaluation (Signed)
Anesthesia Post Note  Patient: Jackie Hunter  Procedure(s) Performed: Procedure(s) (LRB): COLONOSCOPY WITH PROPOFOL (N/A)  Patient location during evaluation: Endoscopy Anesthesia Type: General Level of consciousness: awake and alert Pain management: pain level controlled Vital Signs Assessment: post-procedure vital signs reviewed and stable Respiratory status: spontaneous breathing, nonlabored ventilation, respiratory function stable and patient connected to nasal cannula oxygen Cardiovascular status: blood pressure returned to baseline and stable Postop Assessment: no signs of nausea or vomiting Anesthetic complications: no     Last Vitals:  Vitals:   11/02/16 1423 11/02/16 1433  BP: 125/76 (!) 142/61  Pulse: (!) 50 (!) 47  Resp: 16 10  Temp:      Last Pain:  Vitals:   11/02/16 1404  TempSrc: Tympanic                 Tavarius Grewe S

## 2016-11-02 NOTE — Anesthesia Post-op Follow-up Note (Cosign Needed)
Anesthesia QCDR form completed.        

## 2016-11-02 NOTE — Op Note (Addendum)
[facility] Gastroenterology Patient Name: Jackie Hunter Procedure Date: 11/02/2016 12:42 PM MRN: [id number] Account #: [id number] Date of Birth: January 13, 1946 Admit Type: Outpatient Age: 71 Room: [facility] ENDO ROOM 3 Gender: Female Note Status: Finalized Procedure:            Colonoscopy Indications:          Abnormal CT of the GI tract, Follow-up of diverticulitis Providers:            Lollie Sails, MD Medicines:            Monitored Anesthesia Care Complications:        No immediate complications. Procedure:            Pre-Anesthesia Assessment:                       - ASA Grade Assessment: II - A patient with mild                        systemic disease.                       After obtaining informed consent, the colonoscope was                        passed under direct vision. Throughout the procedure,                        the patient's blood pressure, pulse, and oxygen                        saturations were monitored continuously. The                        Colonoscope was introduced through the anus and                        advanced to the the cecum, identified by appendiceal                        orifice and ileocecal valve. The colonoscopy was                        performed without difficulty. The patient tolerated the                        procedure well. The quality of the bowel preparation                        was good. Findings:      Many small and large-mouthed diverticula were found in the entire colon.       There is a medium sized diverticulum in the proximal transverse with       some purulence noted with mild peridiverticular erythema. There are       numerous diverticula in the ascending colon. There is a lipoma in the       proximal ascending that is non-inflamed, positive pillow test noted.       There is a possible large diverticulum in the proximal to mid ascending       that shows some flaccid folds around it but no evidence of  mass,  inflamation or other atypical appearance with the possibility of slight       purulence or debris. The appearance noted on procedure 05/03/16 is not       noted and apparently resolved.      The retroflexed view of the distal rectum and anal verge was normal and       showed no anal or rectal abnormalities.      The exam was otherwise without abnormality.      The digital rectal exam was normal. Impression:           - Diverticulosis in the entire examined colon.                       - The distal rectum and anal verge are normal on                        retroflexion view.                       - The examination was otherwise normal.                       - No specimens collected. Recommendation:       - Use Citrucel one tablespoon PO daily daily.                       - Cipro (ciprofloxacin) 500 mg PO BID for 1 week.                       - Flagyl (metronidazole) 250 mg PO QID for 1 week.                       - Return to GI clinic in 1 month. Procedure Code(s):    --- Professional ---                       [phone], Colonoscopy, flexible; diagnostic, including                        collection of specimen(s) by brushing or washing, when                        performed (separate procedure) Diagnosis Code(s):    --- Professional ---                       D22.02, Diverticulitis of large intestine without                        perforation or abscess without bleeding                       K57.30, Diverticulosis of large intestine without                        perforation or abscess without bleeding                       R93.3, Abnormal findings on diagnostic imaging of other                        parts of digestive tract CPT copyright 2016 American  Medical Association. All rights reserved. The codes documented in this report are preliminary and upon coder review may  be revised to meet current compliance requirements. Lollie Sails, MD 11/02/2016 2:12:01 PM This report  has been signed electronically. Number of Addenda: 0 Note Initiated On: 11/02/2016 12:42 PM Scope Withdrawal Time: 0 hours 17 minutes 7 seconds  Total Procedure Duration: 0 hours 26 minutes 19 seconds       [facility]

## 2016-11-02 NOTE — H&P (Signed)
Outpatient short stay form Pre-procedure 11/02/2016 1:18 PM Lollie Sails MD  Primary Physician: Dr Tracie Harrier  Reason for visit:  Colonoscopy  History of present illness:  Patient is a 71 year old female presenting today for colonoscopy. She actually came in yesterday however prep was inadequate and she was sent home for reprep. She accomplish this. 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 She was treated with antibiotics that time. His CEA was checked which was normal. There was the intent to have her back in for recheck colonoscopy. In the interim she has had a CT scan which showed concern for possible neoplastic lesion in this region. She is returning for repeat check.    Current Facility-Administered Medications:  .  0.9 %  sodium chloride infusion, , Intravenous, Continuous, Lollie Sails, MD  Prescriptions Prior to Admission  Medication Sig Dispense Refill Last Dose  . gabapentin (NEURONTIN) 300 MG capsule Take 300 mg by mouth 3 (three) times daily.   Past Week at Unknown time  . ibuprofen (ADVIL,MOTRIN) 200 MG tablet Take 200 mg by mouth every 6 (six) hours as needed.   Past Week at Unknown time  . lisinopril-hydrochlorothiazide (PRINZIDE,ZESTORETIC) 10-12.5 MG tablet Take 1 tablet by mouth daily.   11/02/2016 at Unknown time  . mometasone (NASONEX) 50 MCG/ACT nasal spray Place 2 sprays into the nose daily.   Past Week at Unknown time  . omega-3 acid ethyl esters (LOVAZA) 1 g capsule Take by mouth 2 (two) times daily.   Past Week at Unknown time  . pantoprazole (PROTONIX) 40 MG tablet Take 40 mg by mouth daily.   Past Week at Unknown time  . predniSONE (DELTASONE) 5 MG tablet    Past Week at Unknown time  . traMADol (ULTRAM) 50 MG tablet Take by mouth every 6 (six) hours as needed.   Past Week at Unknown time  . vitamin B-12 (CYANOCOBALAMIN) 1000 MCG tablet Take 1,000 mcg by  mouth daily.   Past Week at Unknown time  . albuterol (VENTOLIN HFA) 108 (90 Base) MCG/ACT inhaler Inhale 2 puffs into the lungs every 4 (four) hours as needed for wheezing or shortness of breath.   05/02/2016 at Unknown time     Allergies  Allergen Reactions  . Levaquin [Levofloxacin In D5w] Other (See Comments)    dizziness  . Meloxicam   . Codeine Anxiety     Past Medical History:  Diagnosis Date  . Adenoma of colon   . Allergic rhinitis   . Anxiety   . Arthritis   . Asthma   . Back pain   . Carpal tunnel syndrome   . Cervicalgia   . Chronic kidney disease    STAGE 3  . DDD (degenerative disc disease), lumbar   . GERD (gastroesophageal reflux disease)   . Headache   . History of hiatal hernia   . Hypertension   . Lipids serum increased   . Lumbago   . Migraines   . Obesity   . Osteoarthritis   . Stroke ([place])   . TIA (transient ischemic attack)   . Wrist fracture     Review of systems:      Physical Exam    Heart and lungs: Regular rate and rhythm without rub or gallop, lungs are bilaterally clear.    HEENT: Normocephalic atraumatic eyes are anicteric    Other:     Pertinant exam for procedure:  Soft nontender nondistended bowel sounds positive normoactive. There are no masses rebound or organomegaly.    Planned proceedures: Colonoscopy and indicated procedures. I have discussed the risks benefits and complications of procedures to include not limited to bleeding, infection, perforation and the risk of sedation and the patient wishes to proceed.    Lollie Sails, MD Gastroenterology 11/02/2016  1:18 PM

## 2016-11-03 ENCOUNTER — Encounter: Payer: Self-pay | Admitting: Gastroenterology

## 2016-11-04 ENCOUNTER — Ambulatory Visit
Admission: RE | Admit: 2016-11-04 | Discharge: 2016-11-04 | Disposition: A | Discharge status: Home / Self Care | Payer: PPO | Source: Ambulatory Visit | Attending: Internal Medicine | Admitting: Internal Medicine

## 2016-11-04 DIAGNOSIS — N6324 Unspecified lump in the left breast, lower inner quadrant: Secondary | ICD-10-CM | POA: Diagnosis not present

## 2016-11-04 DIAGNOSIS — N6310 Unspecified lump in the right breast, unspecified quadrant: Secondary | ICD-10-CM

## 2016-11-04 DIAGNOSIS — R928 Other abnormal and inconclusive findings on diagnostic imaging of breast: Secondary | ICD-10-CM | POA: Diagnosis not present

## 2016-11-04 DIAGNOSIS — N6322 Unspecified lump in the left breast, upper inner quadrant: Secondary | ICD-10-CM | POA: Diagnosis not present

## 2016-11-04 DIAGNOSIS — N6001 Solitary cyst of right breast: Secondary | ICD-10-CM | POA: Insufficient documentation

## 2016-11-04 DIAGNOSIS — N6313 Unspecified lump in the right breast, lower outer quadrant: Secondary | ICD-10-CM | POA: Diagnosis not present

## 2016-11-07 ENCOUNTER — Other Ambulatory Visit: Payer: Self-pay | Admitting: Internal Medicine

## 2016-11-07 DIAGNOSIS — R928 Other abnormal and inconclusive findings on diagnostic imaging of breast: Secondary | ICD-10-CM

## 2016-11-07 DIAGNOSIS — N6320 Unspecified lump in the left breast, unspecified quadrant: Secondary | ICD-10-CM

## 2016-11-14 NOTE — Anesthesia Postprocedure Evaluation (Signed)
Anesthesia Post Note  Patient: Jackie Hunter  Procedure(s) Performed: Procedure(s) (LRB): COLONOSCOPY WITH PROPOFOL (N/A)  Patient location during evaluation: PACU Anesthesia Type: General Level of consciousness: awake and alert Pain management: pain level controlled Vital Signs Assessment: post-procedure vital signs reviewed and stable Respiratory status: spontaneous breathing, nonlabored ventilation, respiratory function stable and patient connected to nasal cannula oxygen Cardiovascular status: blood pressure returned to baseline and stable Postop Assessment: no signs of nausea or vomiting Anesthetic complications: no     Last Vitals:  Vitals:   11/01/16 1029 11/01/16 1039  BP: (!) 114/52 (!) 118/59  Pulse: (!) 55 (!) 57  Resp: 14 18  Temp:      Last Pain:  Vitals:   11/02/16 0744  TempSrc:   PainSc: 0-No pain                 Molli Barrows

## 2016-11-15 ENCOUNTER — Emergency Department: Payer: PPO

## 2016-11-15 ENCOUNTER — Encounter: Payer: Self-pay | Admitting: Emergency Medicine

## 2016-11-15 ENCOUNTER — Emergency Department
Admission: EM | Admit: 2016-11-15 | Discharge: 2016-11-15 | Disposition: A | Discharge status: Home / Self Care | Payer: PPO | Attending: Emergency Medicine | Admitting: Emergency Medicine

## 2016-11-15 DIAGNOSIS — R531 Weakness: Secondary | ICD-10-CM | POA: Diagnosis not present

## 2016-11-15 DIAGNOSIS — R51 Headache: Secondary | ICD-10-CM

## 2016-11-15 DIAGNOSIS — G252 Other specified forms of tremor: Secondary | ICD-10-CM | POA: Diagnosis not present

## 2016-11-15 DIAGNOSIS — R6883 Chills (without fever): Secondary | ICD-10-CM | POA: Insufficient documentation

## 2016-11-15 DIAGNOSIS — J45909 Unspecified asthma, uncomplicated: Secondary | ICD-10-CM | POA: Diagnosis not present

## 2016-11-15 DIAGNOSIS — N183 Chronic kidney disease, stage 3 (moderate): Secondary | ICD-10-CM | POA: Insufficient documentation

## 2016-11-15 DIAGNOSIS — Z79899 Other long term (current) drug therapy: Secondary | ICD-10-CM | POA: Diagnosis not present

## 2016-11-15 DIAGNOSIS — R519 Headache, unspecified: Secondary | ICD-10-CM

## 2016-11-15 DIAGNOSIS — I129 Hypertensive chronic kidney disease with stage 1 through stage 4 chronic kidney disease, or unspecified chronic kidney disease: Secondary | ICD-10-CM | POA: Insufficient documentation

## 2016-11-15 DIAGNOSIS — M791 Myalgia: Secondary | ICD-10-CM | POA: Diagnosis not present

## 2016-11-15 DIAGNOSIS — Z0389 Encounter for observation for other suspected diseases and conditions ruled out: Secondary | ICD-10-CM | POA: Diagnosis not present

## 2016-11-15 DIAGNOSIS — R52 Pain, unspecified: Secondary | ICD-10-CM

## 2016-11-15 DIAGNOSIS — B349 Viral infection, unspecified: Secondary | ICD-10-CM | POA: Diagnosis not present

## 2016-11-15 LAB — COMPREHENSIVE METABOLIC PANEL
ALT: 19 U/L (ref 14–54)
AST: 36 U/L (ref 15–41)
Albumin: 4.3 g/dL (ref 3.5–5.0)
Alkaline Phosphatase: 58 U/L (ref 38–126)
Anion gap: 8 (ref 5–15)
BUN: 26 mg/dL — ABNORMAL HIGH (ref 6–20)
CO2: 25 mmol/L (ref 22–32)
Calcium: 8.7 mg/dL — ABNORMAL LOW (ref 8.9–10.3)
Chloride: 102 mmol/L (ref 101–111)
Creatinine, Ser: 1.07 mg/dL — ABNORMAL HIGH (ref 0.44–1.00)
GFR calc Af Amer: 58 mL/min — ABNORMAL LOW (ref >60)
GFR calc non Af Amer: 50 mL/min — ABNORMAL LOW (ref >60)
Glucose, Bld: 117 mg/dL — ABNORMAL HIGH (ref 65–99)
Potassium: 3.4 mmol/L — ABNORMAL LOW (ref 3.5–5.1)
Sodium: 135 mmol/L (ref 135–145)
Total Bilirubin: 0.4 mg/dL (ref 0.3–1.2)
Total Protein: 7.9 g/dL (ref 6.5–8.1)

## 2016-11-15 LAB — URINALYSIS, COMPLETE (UACMP) WITH MICROSCOPIC
Bacteria, UA: NONE SEEN
Bilirubin Urine: NEGATIVE
Glucose, UA: NEGATIVE mg/dL
Ketones, ur: NEGATIVE mg/dL
Leukocytes, UA: NEGATIVE
Nitrite: NEGATIVE
Protein, ur: NEGATIVE mg/dL
Specific Gravity, Urine: 1.009 (ref 1.005–1.030)
Squamous Epithelial / LPF: NONE SEEN
pH: 5 (ref 5.0–8.0)

## 2016-11-15 LAB — CBC WITH DIFFERENTIAL/PLATELET
Basophils Absolute: 0 10*3/uL (ref 0–0.1)
Basophils Relative: 0 %
Eosinophils Absolute: 0.3 10*3/uL (ref 0–0.7)
Eosinophils Relative: 2 %
HCT: 39.9 % (ref 35.0–47.0)
Hemoglobin: 13.2 g/dL (ref 12.0–16.0)
Lymphocytes Relative: 9 %
Lymphs Abs: 1.2 10*3/uL (ref 1.0–3.6)
MCH: 30.5 pg (ref 26.0–34.0)
MCHC: 33.2 g/dL (ref 32.0–36.0)
MCV: 91.7 fL (ref 80.0–100.0)
Monocytes Absolute: 0.1 10*3/uL — ABNORMAL LOW (ref 0.2–0.9)
Monocytes Relative: 1 %
Neutro Abs: 11.8 10*3/uL — ABNORMAL HIGH (ref 1.4–6.5)
Neutrophils Relative %: 88 %
Platelets: 337 10*3/uL (ref 150–440)
RBC: 4.35 MIL/uL (ref 3.80–5.20)
RDW: 15.1 % — ABNORMAL HIGH (ref 11.5–14.5)
WBC: 13.4 10*3/uL — ABNORMAL HIGH (ref 3.6–11.0)

## 2016-11-15 LAB — TROPONIN I: Troponin I: <0.03 ng/mL (ref <0.03)

## 2016-11-15 LAB — INFLUENZA PANEL BY PCR (TYPE A & B)
Influenza A By PCR: NEGATIVE
Influenza B By PCR: NEGATIVE

## 2016-11-15 MED ORDER — KETOROLAC TROMETHAMINE 30 MG/ML IJ SOLN
30.0000 mg | Freq: Once | INTRAMUSCULAR | Status: AC
  Administered 2016-11-15: 30 mg via INTRAVENOUS
  Filled 2016-11-15: qty 1

## 2016-11-15 MED ORDER — DIPHENHYDRAMINE HCL 50 MG/ML IJ SOLN
25.0000 mg | Freq: Once | INTRAMUSCULAR | Status: AC
  Administered 2016-11-15: 25 mg via INTRAVENOUS
  Filled 2016-11-15: qty 1

## 2016-11-15 MED ORDER — SODIUM CHLORIDE 0.9 % IV BOLUS (SEPSIS)
1000.0000 mL | Freq: Once | INTRAVENOUS | Status: AC
  Administered 2016-11-15: 1000 mL via INTRAVENOUS

## 2016-11-15 MED ORDER — BENZONATATE 100 MG PO CAPS
ORAL_CAPSULE | ORAL | Status: AC
  Administered 2016-11-15: 100 mg via ORAL
  Filled 2016-11-15: qty 1

## 2016-11-15 MED ORDER — METOCLOPRAMIDE HCL 5 MG/ML IJ SOLN
10.0000 mg | Freq: Once | INTRAMUSCULAR | Status: AC
  Administered 2016-11-15: 10 mg via INTRAVENOUS
  Filled 2016-11-15: qty 2

## 2016-11-15 MED ORDER — BENZONATATE 100 MG PO CAPS
100.0000 mg | ORAL_CAPSULE | Freq: Once | ORAL | Status: AC
  Administered 2016-11-15: 100 mg via ORAL

## 2016-11-15 NOTE — ED Provider Notes (Signed)
[facility] Emergency Department Provider Note   ____________________________________________   First MD Initiated Contact with Patient 11/15/16 [phone]     (approximate)  I have reviewed the triage vital signs and the nursing notes.   HISTORY  Chief Complaint Shaking and Shortness of Breath    HPI Jackie Hunter is a 71 y.o. female who the hospital today not feeling well. The patient reports that she was feeling shaky and achy.The patient states that it started earlier today but it worsened tonight. The patient had flulike symptoms but had no temperature. She just well. She had pneumonia a month ago. The patient states that she was so shaky and so off balance that she had to hold onto the wall to get the bathroom. She called EMS to be brought in to see what was going on with her body. The patient denies any chest pain or fever. She has some mild shortness of breath. She has no nausea no vomiting no abdominal pain. She did have some dizziness when she got up. The patient denies any blurred vision. She was worried because she lives alone. The top of her head is hurting. The patient rates her pain 8 out of 10 in intensity. She states that she had some amoxicillin that she took some she wasn't feeling well. She had some pain at her nose and her eyes.   Past Medical History:  Diagnosis Date  . Adenoma of colon   . Allergic rhinitis   . Anxiety   . Arthritis   . Asthma   . Back pain   . Carpal tunnel syndrome   . Cervicalgia   . Chronic kidney disease    STAGE 3  . DDD (degenerative disc disease), lumbar   . GERD (gastroesophageal reflux disease)   . Headache   . History of hiatal hernia   . Hypertension   . Lipids serum increased   . Lumbago   . Migraines   . Obesity   . Osteoarthritis   . Stroke ([place])   . TIA (transient ischemic attack)   . Wrist fracture     Patient Active Problem List   Diagnosis Date Noted  . Migraines 02/18/2016  . CKD  (chronic kidney disease) stage 3, GFR 30-59 ml/min 08/13/2015  . Moderate episode of recurrent major depressive disorder ([place]) 08/13/2015  . DDD (degenerative disc disease), lumbar 04/02/2015  . Lumbar radiculitis 04/02/2015  . Lumbar stenosis with neurogenic claudication 04/02/2015  . Depressive disorder 11/05/2014  . Essential hypertension 11/05/2014  . Gastroesophageal reflux disease without esophagitis 11/05/2014  . Generalized osteoarthritis of multiple sites 11/05/2014  . Subcutaneous nodule 11/05/2014  . Anxiety associated with depression 06/25/2014  . Asthma without status asthmaticus 06/25/2014  . Benign essential hypertension 06/25/2014  . Other and unspecified hyperlipidemia 06/25/2014  . Frequency of micturition 11/29/2013  . Urinary urgency 11/29/2013  . Increased frequency of urination 11/29/2013  . Recurrent urinary tract infection 02/08/2013  . Urinary tract infection 02/08/2013    Past Surgical History:  Procedure Laterality Date  . BREAST BIOPSY Right 2014   NEG  . BREAST EXCISIONAL BIOPSY Left 1967   NEG  . cataracts Bilateral   . COLONOSCOPY WITH PROPOFOL N/A 05/03/2016   Procedure: COLONOSCOPY WITH PROPOFOL;  Surgeon: Lollie Sails, MD;  Location: [facility] ENDOSCOPY;  Service: Endoscopy;  Laterality: N/A;  . COLONOSCOPY WITH PROPOFOL N/A 11/01/2016   Procedure: COLONOSCOPY WITH PROPOFOL;  Surgeon: Lollie Sails, MD;  Location: [facility] ENDOSCOPY;  Service:  Endoscopy;  Laterality: N/A;  . COLONOSCOPY WITH PROPOFOL N/A 11/02/2016   Procedure: COLONOSCOPY WITH PROPOFOL;  Surgeon: Lollie Sails, MD;  Location: [facility] ENDOSCOPY;  Service: Endoscopy;  Laterality: N/A;  . ESOPHAGOGASTRODUODENOSCOPY (EGD) WITH PROPOFOL N/A 10/26/2015   Procedure: ESOPHAGOGASTRODUODENOSCOPY (EGD) WITH PROPOFOL;  Surgeon: [name], MD;  Location: [facility] ENDOSCOPY;  Service: Gastroenterology;  Laterality: N/A;  . EYE SURGERY    . OOPHORECTOMY     left    Prior to Admission medications    Medication Sig Start Date End Date Taking? Authorizing Provider  albuterol (VENTOLIN HFA) 108 (90 Base) MCG/ACT inhaler Inhale 2 puffs into the lungs every 4 (four) hours as needed for wheezing or shortness of breath.    Historical Provider, MD  gabapentin (NEURONTIN) 300 MG capsule Take 300 mg by mouth 3 (three) times daily.    Historical Provider, MD  ibuprofen (ADVIL,MOTRIN) 200 MG tablet Take 200 mg by mouth every 6 (six) hours as needed.    Historical Provider, MD  lisinopril-hydrochlorothiazide (PRINZIDE,ZESTORETIC) 10-12.5 MG tablet Take 1 tablet by mouth daily.    Historical Provider, MD  mometasone (NASONEX) 50 MCG/ACT nasal spray Place 2 sprays into the nose daily.    Historical Provider, MD  omega-3 acid ethyl esters (LOVAZA) 1 g capsule Take by mouth 2 (two) times daily.    Historical Provider, MD  pantoprazole (PROTONIX) 40 MG tablet Take 40 mg by mouth daily.    Historical Provider, MD  predniSONE (DELTASONE) 5 MG tablet  03/10/16   Historical Provider, MD  traMADol (ULTRAM) 50 MG tablet Take by mouth every 6 (six) hours as needed.    Historical Provider, MD  vitamin B-12 (CYANOCOBALAMIN) 1000 MCG tablet Take 1,000 mcg by mouth daily.    Historical Provider, MD    Allergies Levaquin [levofloxacin in d5w]; Meloxicam; and Codeine  Family History  Problem Relation Age of Onset  . Kidney cancer Neg Hx   . Prostate cancer Neg Hx   . Breast cancer Neg Hx     Social History Social History  Substance Use Topics  . Smoking status: Never Smoker  . Smokeless tobacco: Never Used  . Alcohol use Yes    Review of Systems  Constitutional: shakes Eyes: No visual changes. ENT: No sore throat. Cardiovascular: Denies chest pain. Respiratory: Denies shortness of breath. Gastrointestinal: No abdominal pain.  No nausea, no vomiting.  No diarrhea.  No constipation. Genitourinary: Negative for dysuria. Musculoskeletal: Negative for back pain. Skin: Negative for rash. Neurological:  headache   ____________________________________________   PHYSICAL EXAM:  VITAL SIGNS: ED Triage Vitals [11/15/16 0254]  Enc Vitals Group     BP 123/73     Pulse Rate (!) 112     Resp 20     Temp 99.2 F (37.3 C)     Temp Source Oral     SpO2 100 %     Weight 175 lb (79.4 kg)     Height 5\' 3"  (1.6 m)     Head Circumference      Peak Flow      Pain Score 8     Pain Loc      Pain Edu?      Excl. in [place]?     Constitutional: Alert and oriented. Well appearing and in mild distress. Eyes: Conjunctivae are normal. PERRL. EOMI. Head: Atraumatic. Nose: No congestion/rhinnorhea. Mouth/Throat: Mucous membranes are moist.  Oropharynx non-erythematous. Cardiovascular: Normal rate, regular rhythm. Grossly normal heart sounds.  Good peripheral circulation. Respiratory:  Normal respiratory effort.  No retractions. Lungs CTAB. Gastrointestinal: Soft and nontender. No distention. Positive bowel sounds Musculoskeletal: No lower extremity tenderness nor edema.   Neurologic:  Normal speech and language.  Skin:  Skin is warm, dry and intact.  Psychiatric: Mood and affect are normal.   ____________________________________________   LABS (all labs ordered are listed, but only abnormal results are displayed)  Labs Reviewed  CBC WITH DIFFERENTIAL/PLATELET - Abnormal; Notable for the following:       Result Value   WBC 13.4 (*)    RDW 15.1 (*)    Neutro Abs 11.8 (*)    Monocytes Absolute 0.1 (*)    All other components within normal limits  COMPREHENSIVE METABOLIC PANEL - Abnormal; Notable for the following:    Potassium 3.4 (*)    Glucose, Bld 117 (*)    BUN 26 (*)    Creatinine, Ser 1.07 (*)    Calcium 8.7 (*)    GFR calc non Af Amer 50 (*)    GFR calc Af Amer 58 (*)    All other components within normal limits  URINALYSIS, COMPLETE (UACMP) WITH MICROSCOPIC - Abnormal; Notable for the following:    Color, Urine YELLOW (*)    APPearance CLEAR (*)    Hgb urine dipstick MODERATE  (*)    All other components within normal limits  TROPONIN I  INFLUENZA PANEL BY PCR (TYPE A & B)   ____________________________________________  EKG  ED ECG REPORT I, Loney Hering, the attending physician, personally viewed and interpreted this ECG.   Date: 11/15/2016  EKG Time: 255  Rate: 111  Rhythm: sinus tachycardia  Axis: normal  Intervals:none  ST&T Change: none  ____________________________________________  RADIOLOGY  CXR CT head ____________________________________________   PROCEDURES  Procedure(s) performed: None  Procedures  Critical Care performed: No  ____________________________________________   INITIAL IMPRESSION / ASSESSMENT AND PLAN / ED COURSE  Pertinent labs & imaging results that were available during my care of the patient were reviewed by me and considered in my medical decision making (see chart for details).  This is a 71 year old female who came into the hospital today feeling unwell. It sounds that the patient has a viral illness. I did check some blood work on the patient with was unremarkable. The patient's chest x-ray did not show any pneumonia. The patient's urinalysis also did not have any bacteria although there was I did give the patient liter of normal saline and I did do a CT scan of the patient's head. I will reassess the patient and disposition her once I received all of her results.  Clinical Course as of Nov 16 750  Tue Nov 15, 2016  8841 No active cardiopulmonary disease. DG Chest 2 View [AW]  0710 1. Normal for age non contrast CT appearance of the brain. 2. 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 3. Mild left forehead scalp soft tissue thickening of unclear significance. No underlying frontal bone fracture.   CT Head Wo Contrast [AW]    Clinical Course User Index [AW] Loney Hering, MD   The patient's blood work and imaging studies are all unremarkable. She did  receive some Reglan and Benadryl and Toradol for headache and it did improve. At this time I feel that the patient may have a viral illness with her cough and her shakes and body aches. The patient should follow-up with her primary care physician for further evaluation. I discussed this with the patient and she  understands the plan as stated. She'll be discharged home.  ____________________________________________   FINAL CLINICAL IMPRESSION(S) / ED DIAGNOSES  Final diagnoses:  Shaking chills  Body aches  Viral illness  Acute nonintractable headache, unspecified headache type      NEW MEDICATIONS STARTED DURING THIS VISIT:  New Prescriptions   No medications on file     Note:  This document was prepared using Dragon voice recognition software and may include unintentional dictation errors.    Loney Hering, MD 11/15/16 [phone]

## 2016-11-15 NOTE — ED Notes (Signed)
Patient transported to X-ray 

## 2016-11-15 NOTE — ED Triage Notes (Signed)
Pt arrived via ems from home. Pt reports not feeling well and becoming worried because of the "shaking" that started tonight. Pt alert and oriented and in no apparent distress upon arrival.

## 2016-11-15 NOTE — ED Notes (Signed)
Pt ambulated to the toilet in room independently with a steady gait.

## 2016-11-17 ENCOUNTER — Ambulatory Visit: Admission: RE | Admit: 2016-11-17 | Payer: PPO | Source: Ambulatory Visit

## 2016-11-17 DIAGNOSIS — J0191 Acute recurrent sinusitis, unspecified: Secondary | ICD-10-CM | POA: Diagnosis not present

## 2016-11-17 DIAGNOSIS — I10 Essential (primary) hypertension: Secondary | ICD-10-CM | POA: Diagnosis not present

## 2016-11-17 DIAGNOSIS — M5416 Radiculopathy, lumbar region: Secondary | ICD-10-CM | POA: Diagnosis not present

## 2016-11-17 DIAGNOSIS — J301 Allergic rhinitis due to pollen: Secondary | ICD-10-CM | POA: Diagnosis not present

## 2016-11-24 ENCOUNTER — Ambulatory Visit
Admission: RE | Admit: 2016-11-24 | Discharge: 2016-11-24 | Disposition: A | Discharge status: Home / Self Care | Payer: PPO | Source: Ambulatory Visit | Attending: Internal Medicine | Admitting: Internal Medicine

## 2016-11-24 DIAGNOSIS — N6320 Unspecified lump in the left breast, unspecified quadrant: Secondary | ICD-10-CM | POA: Insufficient documentation

## 2016-11-24 DIAGNOSIS — N6489 Other specified disorders of breast: Secondary | ICD-10-CM | POA: Diagnosis not present

## 2016-11-24 DIAGNOSIS — R928 Other abnormal and inconclusive findings on diagnostic imaging of breast: Secondary | ICD-10-CM | POA: Insufficient documentation

## 2016-11-24 DIAGNOSIS — N6342 Unspecified lump in left breast, subareolar: Secondary | ICD-10-CM | POA: Diagnosis not present

## 2016-11-24 HISTORY — PX: BREAST BIOPSY: SHX20

## 2016-11-28 LAB — SURGICAL PATHOLOGY

## 2016-12-22 DIAGNOSIS — Z0000 Encounter for general adult medical examination without abnormal findings: Secondary | ICD-10-CM | POA: Diagnosis not present

## 2016-12-22 DIAGNOSIS — I10 Essential (primary) hypertension: Secondary | ICD-10-CM | POA: Diagnosis not present

## 2016-12-22 DIAGNOSIS — R739 Hyperglycemia, unspecified: Secondary | ICD-10-CM | POA: Diagnosis not present

## 2016-12-22 DIAGNOSIS — M5416 Radiculopathy, lumbar region: Secondary | ICD-10-CM | POA: Diagnosis not present

## 2016-12-22 DIAGNOSIS — F418 Other specified anxiety disorders: Secondary | ICD-10-CM | POA: Diagnosis not present

## 2016-12-22 DIAGNOSIS — Z1231 Encounter for screening mammogram for malignant neoplasm of breast: Secondary | ICD-10-CM | POA: Diagnosis not present

## 2016-12-22 DIAGNOSIS — K5730 Diverticulosis of large intestine without perforation or abscess without bleeding: Secondary | ICD-10-CM | POA: Diagnosis not present

## 2016-12-28 DIAGNOSIS — K5732 Diverticulitis of large intestine without perforation or abscess without bleeding: Secondary | ICD-10-CM | POA: Diagnosis not present

## 2016-12-29 DIAGNOSIS — M159 Polyosteoarthritis, unspecified: Secondary | ICD-10-CM | POA: Diagnosis not present

## 2016-12-29 DIAGNOSIS — M48062 Spinal stenosis, lumbar region with neurogenic claudication: Secondary | ICD-10-CM | POA: Diagnosis not present

## 2016-12-29 DIAGNOSIS — Z0000 Encounter for general adult medical examination without abnormal findings: Secondary | ICD-10-CM | POA: Diagnosis not present

## 2016-12-29 DIAGNOSIS — I10 Essential (primary) hypertension: Secondary | ICD-10-CM | POA: Diagnosis not present

## 2016-12-29 DIAGNOSIS — J4520 Mild intermittent asthma, uncomplicated: Secondary | ICD-10-CM | POA: Diagnosis not present

## 2016-12-29 DIAGNOSIS — R42 Dizziness and giddiness: Secondary | ICD-10-CM | POA: Diagnosis not present

## 2017-02-07 DIAGNOSIS — L910 Hypertrophic scar: Secondary | ICD-10-CM | POA: Diagnosis not present

## 2017-02-07 DIAGNOSIS — L299 Pruritus, unspecified: Secondary | ICD-10-CM | POA: Diagnosis not present

## 2017-02-12 ENCOUNTER — Emergency Department: Payer: PPO

## 2017-02-12 ENCOUNTER — Emergency Department
Admission: EM | Admit: 2017-02-12 | Discharge: 2017-02-12 | Disposition: A | Discharge status: Home / Self Care | Payer: PPO | Attending: Emergency Medicine | Admitting: Emergency Medicine

## 2017-02-12 ENCOUNTER — Encounter: Payer: Self-pay | Admitting: Emergency Medicine

## 2017-02-12 DIAGNOSIS — I129 Hypertensive chronic kidney disease with stage 1 through stage 4 chronic kidney disease, or unspecified chronic kidney disease: Secondary | ICD-10-CM | POA: Diagnosis not present

## 2017-02-12 DIAGNOSIS — R079 Chest pain, unspecified: Secondary | ICD-10-CM | POA: Diagnosis not present

## 2017-02-12 DIAGNOSIS — Z8673 Personal history of transient ischemic attack (TIA), and cerebral infarction without residual deficits: Secondary | ICD-10-CM | POA: Insufficient documentation

## 2017-02-12 DIAGNOSIS — J45909 Unspecified asthma, uncomplicated: Secondary | ICD-10-CM | POA: Insufficient documentation

## 2017-02-12 DIAGNOSIS — Z79899 Other long term (current) drug therapy: Secondary | ICD-10-CM | POA: Insufficient documentation

## 2017-02-12 DIAGNOSIS — N183 Chronic kidney disease, stage 3 (moderate): Secondary | ICD-10-CM | POA: Insufficient documentation

## 2017-02-12 DIAGNOSIS — K219 Gastro-esophageal reflux disease without esophagitis: Secondary | ICD-10-CM | POA: Diagnosis not present

## 2017-02-12 DIAGNOSIS — R51 Headache: Secondary | ICD-10-CM | POA: Diagnosis not present

## 2017-02-12 DIAGNOSIS — G8929 Other chronic pain: Secondary | ICD-10-CM

## 2017-02-12 LAB — BASIC METABOLIC PANEL
Anion gap: 9 (ref 5–15)
BUN: 18 mg/dL (ref 6–20)
CO2: 24 mmol/L (ref 22–32)
Calcium: 9.4 mg/dL (ref 8.9–10.3)
Chloride: 105 mmol/L (ref 101–111)
Creatinine, Ser: 1.17 mg/dL — ABNORMAL HIGH (ref 0.44–1.00)
GFR calc Af Amer: 52 mL/min — ABNORMAL LOW (ref >60)
GFR calc non Af Amer: 45 mL/min — ABNORMAL LOW (ref >60)
Glucose, Bld: 117 mg/dL — ABNORMAL HIGH (ref 65–99)
Potassium: 3.5 mmol/L (ref 3.5–5.1)
Sodium: 138 mmol/L (ref 135–145)

## 2017-02-12 LAB — HEPATIC FUNCTION PANEL
ALT: 17 U/L (ref 14–54)
AST: 23 U/L (ref 15–41)
Albumin: 4.2 g/dL (ref 3.5–5.0)
Alkaline Phosphatase: 58 U/L (ref 38–126)
Bilirubin, Direct: <0.1 mg/dL — ABNORMAL LOW (ref 0.1–0.5)
Total Bilirubin: 0.9 mg/dL (ref 0.3–1.2)
Total Protein: 7.7 g/dL (ref 6.5–8.1)

## 2017-02-12 LAB — CBC
HCT: 39.2 % (ref 35.0–47.0)
Hemoglobin: 13.8 g/dL (ref 12.0–16.0)
MCH: 31.1 pg (ref 26.0–34.0)
MCHC: 35.3 g/dL (ref 32.0–36.0)
MCV: 88.2 fL (ref 80.0–100.0)
Platelets: 368 10*3/uL (ref 150–440)
RBC: 4.45 MIL/uL (ref 3.80–5.20)
RDW: 14.7 % — ABNORMAL HIGH (ref 11.5–14.5)
WBC: 13.5 10*3/uL — ABNORMAL HIGH (ref 3.6–11.0)

## 2017-02-12 LAB — LIPASE, BLOOD: Lipase: 36 U/L (ref 11–51)

## 2017-02-12 LAB — TROPONIN I: Troponin I: <0.03 ng/mL (ref <0.03)

## 2017-02-12 MED ORDER — SODIUM CHLORIDE 0.9 % IV BOLUS (SEPSIS)
500.0000 mL | Freq: Once | INTRAVENOUS | Status: AC
  Administered 2017-02-12: 500 mL via INTRAVENOUS

## 2017-02-12 MED ORDER — ACETAMINOPHEN 325 MG PO TABS
650.0000 mg | ORAL_TABLET | Freq: Once | ORAL | Status: AC
  Administered 2017-02-12: 650 mg via ORAL
  Filled 2017-02-12: qty 2

## 2017-02-12 MED ORDER — GI COCKTAIL ~~LOC~~
30.0000 mL | Freq: Once | ORAL | Status: AC
  Administered 2017-02-12: 30 mL via ORAL
  Filled 2017-02-12: qty 30

## 2017-02-12 NOTE — Discharge Instructions (Addendum)
Return to the anti-acids. If your headache changes or worsens, or if you have fever or increased pain or chest pain or shortness of breath return to the emergency department. Otherwise follow closely with her primary care doctor and referral physicians. emergency room for any new or worrisome symptoms. Continue taking your

## 2017-02-12 NOTE — ED Provider Notes (Addendum)
[facility] Emergency Department Provider Note  ____________________________________________   I have reviewed the triage vital signs and the nursing notes.   HISTORY  Chief Complaint Chest Pain and Headache    HPI Jackie Hunter is a 71 y.o. female with a history of daily reflex symptoms, states that she took cayenne peppers last night in her dinner. She had this would make her reflux worse when she "took a chance" since that time she has had epigastric burning. She tried to go to her regular doctor but I told her that she might be having a heart attack and she needed to come here. Patient is not having any chest pain and to me she denies any shortness of breath. She states that it's a burning discomfort that goes up her throat towards the back of her syrup. She denies any melena bright red blood per rectum hematemesis or significant nausea. This, to her, is the same as her chronic reflux disease. It's worse when she eats. She has not had any right upper quadrant pain or fever. She also states that she has a mild headache. This is not the worst headache of life, gradual onset. She states every time her reflux gets bad she has headaches. She in fact states she's had chronic daily headaches and her doctor states that they come when she gets stressed and anxious. She has had a negative workup for this including 2 negative CT scans in the last few years. She gradual onset headache not worst of life, somewhat central, not pulsating or pounding, no fever no stiff neck. Usually, she takes Flonase and Claritin and it seems to help. She did yesterday and the headache went away but it came back this morning as her reflux symptoms persist.   Past Medical History:  Diagnosis Date  . Adenoma of colon   . Allergic rhinitis   . Anxiety   . Arthritis   . Asthma   . Back pain   . Carpal tunnel syndrome   . Cervicalgia   . Chronic kidney disease    STAGE 3  . DDD (degenerative  disc disease), lumbar   . GERD (gastroesophageal reflux disease)   . Headache   . History of hiatal hernia   . Hypertension   . Lipids serum increased   . Lumbago   . Migraines   . Obesity   . Osteoarthritis   . Stroke ([place])   . TIA (transient ischemic attack)   . Wrist fracture     Patient Active Problem List   Diagnosis Date Noted  . Migraines 02/18/2016  . CKD (chronic kidney disease) stage 3, GFR 30-59 ml/min 08/13/2015  . Moderate episode of recurrent major depressive disorder ([place]) 08/13/2015  . DDD (degenerative disc disease), lumbar 04/02/2015  . Lumbar radiculitis 04/02/2015  . Lumbar stenosis with neurogenic claudication 04/02/2015  . Depressive disorder 11/05/2014  . Essential hypertension 11/05/2014  . Gastroesophageal reflux disease without esophagitis 11/05/2014  . Generalized osteoarthritis of multiple sites 11/05/2014  . Subcutaneous nodule 11/05/2014  . Anxiety associated with depression 06/25/2014  . Asthma without status asthmaticus 06/25/2014  . Benign essential hypertension 06/25/2014  . Other and unspecified hyperlipidemia 06/25/2014  . Frequency of micturition 11/29/2013  . Urinary urgency 11/29/2013  . Increased frequency of urination 11/29/2013  . Recurrent urinary tract infection 02/08/2013  . Urinary tract infection 02/08/2013    Past Surgical History:  Procedure Laterality Date  . BREAST BIOPSY Right 2014   NEG  . BREAST  BIOPSY Left 11/24/2016   [place] core pending  . BREAST EXCISIONAL BIOPSY Left 1967   NEG  . cataracts Bilateral   . COLONOSCOPY WITH PROPOFOL N/A 05/03/2016   Procedure: COLONOSCOPY WITH PROPOFOL;  Surgeon: Lollie Sails, MD;  Location: [facility] ENDOSCOPY;  Service: Endoscopy;  Laterality: N/A;  . COLONOSCOPY WITH PROPOFOL N/A 11/01/2016   Procedure: COLONOSCOPY WITH PROPOFOL;  Surgeon: Lollie Sails, MD;  Location: [facility] ENDOSCOPY;  Service: Endoscopy;  Laterality: N/A;  . COLONOSCOPY WITH PROPOFOL N/A 11/02/2016    Procedure: COLONOSCOPY WITH PROPOFOL;  Surgeon: Lollie Sails, MD;  Location: [facility] ENDOSCOPY;  Service: Endoscopy;  Laterality: N/A;  . ESOPHAGOGASTRODUODENOSCOPY (EGD) WITH PROPOFOL N/A 10/26/2015   Procedure: ESOPHAGOGASTRODUODENOSCOPY (EGD) WITH PROPOFOL;  Surgeon: [name], MD;  Location: [facility] ENDOSCOPY;  Service: Gastroenterology;  Laterality: N/A;  . EYE SURGERY    . OOPHORECTOMY     left    Prior to Admission medications   Medication Sig Start Date End Date Taking? Authorizing Provider  albuterol (VENTOLIN HFA) 108 (90 Base) MCG/ACT inhaler Inhale 2 puffs into the lungs every 4 (four) hours as needed for wheezing or shortness of breath.    [provider]  gabapentin (NEURONTIN) 300 MG capsule Take 300 mg by mouth 3 (three) times daily.    [provider]  ibuprofen (ADVIL,MOTRIN) 200 MG tablet Take 200 mg by mouth every 6 (six) hours as needed.    [provider]  lisinopril-hydrochlorothiazide (PRINZIDE,ZESTORETIC) 10-12.5 MG tablet Take 1 tablet by mouth daily.    [provider]  mometasone (NASONEX) 50 MCG/ACT nasal spray Place 2 sprays into the nose daily.    [provider]  omega-3 acid ethyl esters (LOVAZA) 1 g capsule Take by mouth 2 (two) times daily.    [provider]  pantoprazole (PROTONIX) 40 MG tablet Take 40 mg by mouth daily.    [provider]  predniSONE (DELTASONE) 5 MG tablet  03/10/16   [provider]  traMADol (ULTRAM) 50 MG tablet Take by mouth every 6 (six) hours as needed.    [provider]  vitamin B-12 (CYANOCOBALAMIN) 1000 MCG tablet Take 1,000 mcg by mouth daily.    [provider]    Allergies Levaquin [levofloxacin in d5w]; Meloxicam; and Codeine  Family History  Problem Relation Age of Onset  . Kidney cancer Neg Hx   . Prostate cancer Neg Hx   . Breast cancer Neg Hx     Social History Social History  Substance Use Topics  . Smoking status: Never  Smoker  . Smokeless tobacco: Never Used  . Alcohol use Yes    Review of Systems Constitutional: No fever/chills Eyes: No visual changes. ENT: No sore throat. No stiff neck no neck pain Cardiovascular: Denies chest pain. Respiratory: Denies shortness of breath. Gastrointestinal:   no vomiting.  No diarrhea.  No constipation. Genitourinary: Negative for dysuria. Musculoskeletal: Negative lower extremity swelling Skin: Negative for rash. Neurological: Negative for severe headaches, focal weakness or numbness.   ____________________________________________   PHYSICAL EXAM:  VITAL SIGNS: ED Triage Vitals  Enc Vitals Group     BP 02/12/17 0850 115/65     Pulse Rate 02/12/17 0850 91     Resp 02/12/17 0850 20     Temp 02/12/17 0850 97.7 F (36.5 C)     Temp src --      SpO2 02/12/17 0850 98 %     Weight 02/12/17 0850 164 lb (74.4 kg)  Height 02/12/17 0850 5\' 3"  (1.6 m)     Head Circumference --      Peak Flow --      Pain Score 02/12/17 0848 8     Pain Loc --      Pain Edu? --      Excl. in [place]? --     Constitutional: Alert and oriented. Well appearing and in no acute distress. Eyes: Conjunctivae are normal Head: Atraumatic HEENT: No congestion/rhinnorhea. Mucous membranes are moist.  Oropharynx non-erythematous Neck:   Nontender with no meningismus, no masses, no stridor Cardiovascular: Normal rate, regular rhythm. Grossly normal heart sounds.  Good peripheral circulation. Respiratory: Normal respiratory effort.  No retractions. Lungs CTAB. Abdominal: Soft and very slight epigastric discomfort which reproduces her pain. No distention. No guarding no rebound Back:  There is no focal tenderness or step off.  there is no midline tenderness there are no lesions noted. there is no CVA tenderness Musculoskeletal: No lower extremity tenderness, no upper extremity tenderness. No joint effusions, no DVT signs strong distal pulses no edema Neurologic:  Normal speech and  language. No gross focal neurologic deficits are appreciated.  Skin:  Skin is warm, dry and intact. No rash noted. Psychiatric: Mood and affect are normal. Speech and behavior are normal.  ____________________________________________   LABS (all labs ordered are listed, but only abnormal results are displayed)  Labs Reviewed  BASIC METABOLIC PANEL  CBC  TROPONIN I  HEPATIC FUNCTION PANEL  LIPASE, BLOOD   ____________________________________________  EKG  I personally interpreted any EKGs ordered by me or triage Normal sinus rhythm at 96 bpm no acute ST admission acute ST depression ossific ST changes, ____________________________________________  RADIOLOGY  I reviewed any imaging ordered by me or triage that were performed during my shift and, if possible, patient and/or family made aware of any abnormal findings. ____________________________________________   PROCEDURES  Procedure(s) performed: None  Procedures  Critical Care performed: None  ____________________________________________   INITIAL IMPRESSION / ASSESSMENT AND PLAN / ED COURSE  Pertinent labs & imaging results that were available during my care of the patient were reviewed by me and considered in my medical decision making (see chart for details).  Patient with reproducible epigastric discomfort in the context of having a history of GERD and eating hot peppers last night for dinner. Low suspicion for ACS PE or dissection but we will check cardiac enzymes. As the pain has been ongoing since dinner last night, I think one set of cardiac enzymes should be sufficient to show something if something is going to be shown. EKG is reassuring. She also has a headache. This is a chronic headache for the patient. She states she gets it when she gets anxious upset or has any other kinds of symptoms that make her feel unwell. She's had this headache nearly every day for years, and not the worst headache of life, no  neurologic signs no signs of meningismus or meningitis. No signs of head bleed. I don't think this represents cavernous thrombosis, aneurysmal event, meningitis bleed or mass. We will give her IV fluid and she states with Tylenol usually her headaches should get better. We will watch her closely here in the emergency department. Very reassuring exam very reassuring history reassuring vitals. Reassuring EKG.  ----------------------------------------- 10:49 AM on 02/12/2017 -----------------------------------------  The second she had the GI cocktail I'll her pain went away. While this on its own is not sufficient to say that there is no evidence of ACS, with a  negative troponin despite 12 hours, plus, of pain, EKG which is reassuring and a history such as hers which is quite consistent with reflux disease according to patient and myself, I think that that is certainly suggestive of some degree of confirmation of our suspicions.At this time, there does not appear to be clinical evidence to support the diagnosis of pulmonary embolus, dissection, myocarditis, endocarditis, pericarditis, pericardial tamponade, acute coronary syndrome, pneumothorax, pneumonia, or any other acute intrathoracic pathology that will require admission or acute intervention. Nor is there evidence of any significant intra-abdominal pathology causing this discomfort. Headache is gone, we will discharge. Patient tolerating by mouth with no difficulty abdomen benign and nontender at this time return precautions and follow-up given and understood    ____________________________________________   FINAL CLINICAL IMPRESSION(S) / ED DIAGNOSES  Final diagnoses:  None      This chart was dictated using voice recognition software.  Despite best efforts to proofread,  errors can occur which can change meaning.      Schuyler Amor, MD 02/12/17 6950    Schuyler Amor, MD 02/12/17 1050

## 2017-02-12 NOTE — ED Notes (Signed)
Report received from kristin. Pt here for burning chest pain x 1 week. Labs are resulted and awaiting disposition.

## 2017-02-12 NOTE — ED Triage Notes (Signed)
Chest pain for a wk today feels burning sensation to central chest and severe headache

## 2017-03-17 ENCOUNTER — Ambulatory Visit: Payer: Self-pay

## 2017-05-16 DIAGNOSIS — L853 Xerosis cutis: Secondary | ICD-10-CM | POA: Diagnosis not present

## 2017-05-16 DIAGNOSIS — L910 Hypertrophic scar: Secondary | ICD-10-CM | POA: Diagnosis not present

## 2017-06-09 IMAGING — MG MM DIGITAL DIAGNOSTIC BILAT W/ TOMO W/ CAD
8 of 16 series · 8 of 32 positions shown · non-contrast
Comparison: Previous exam(s).

CLINICAL DATA: 71-year-old female presenting for screening recall
of a possible right breast mass and possible left breast distortion.

EXAM:
2D DIGITAL DIAGNOSTIC BILATERAL MAMMOGRAM WITH CAD AND ADJUNCT TOMO
BILATERAL BREAST ULTRASOUND

[L MLO synth-2D]
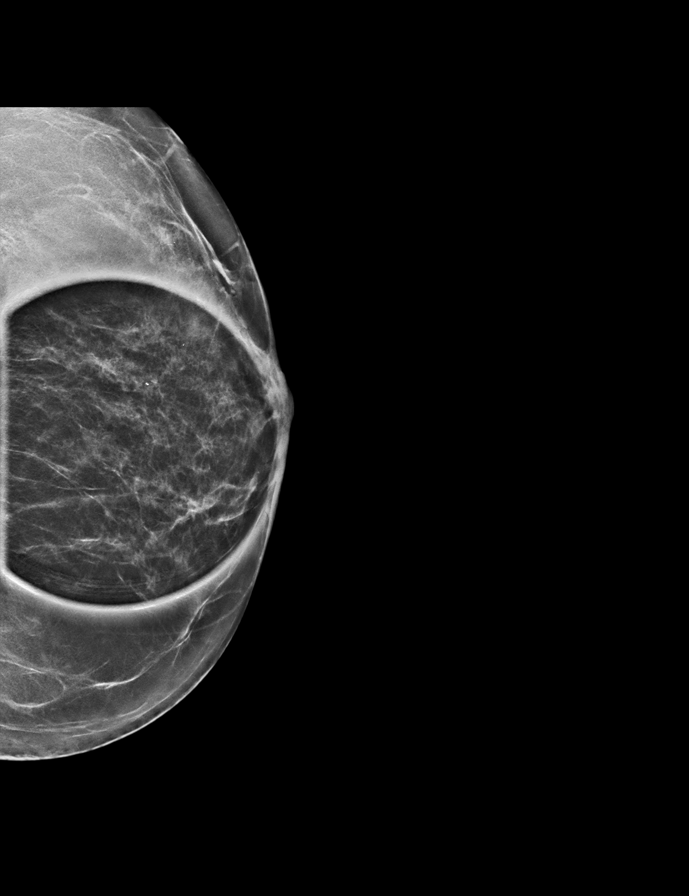

[L CC synth-2D]
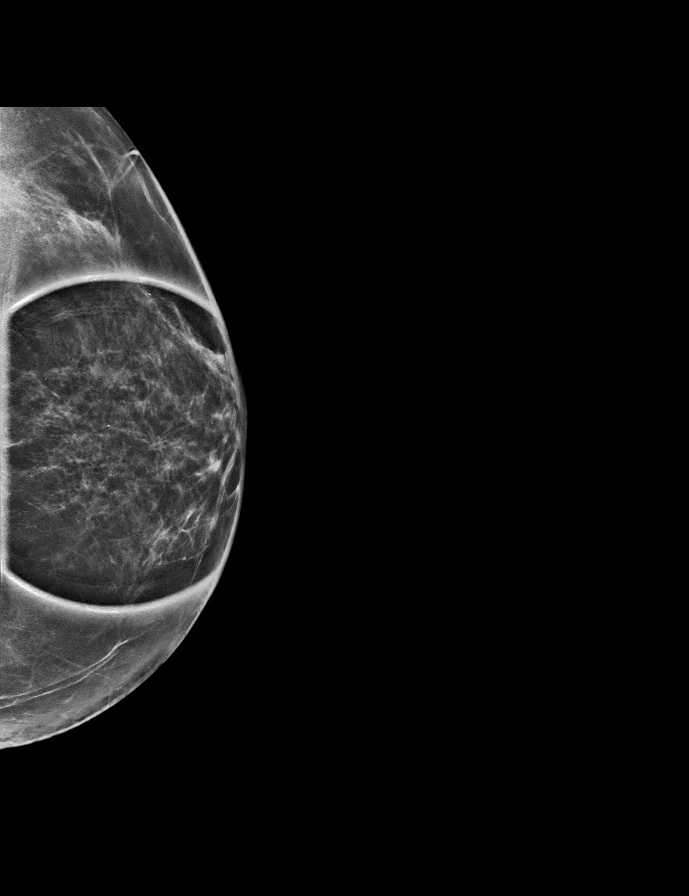

[R CC synth-2D]
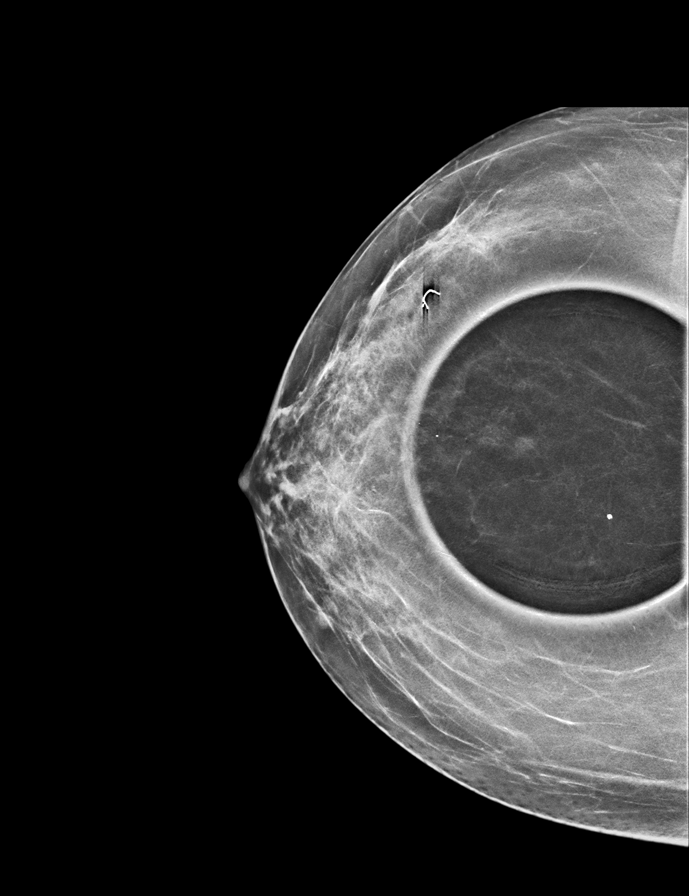

[L MLO]
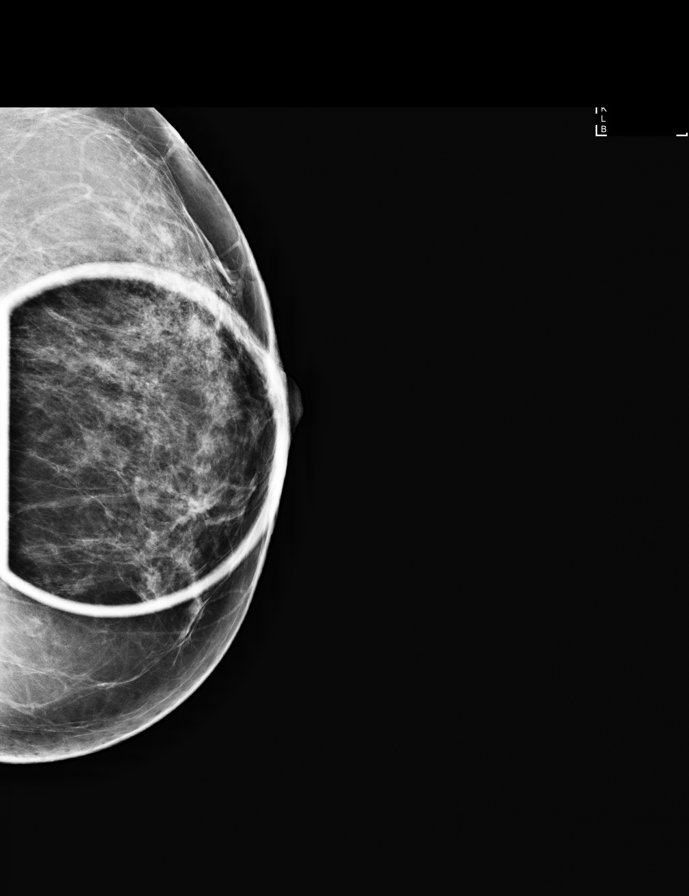

[R MLO]
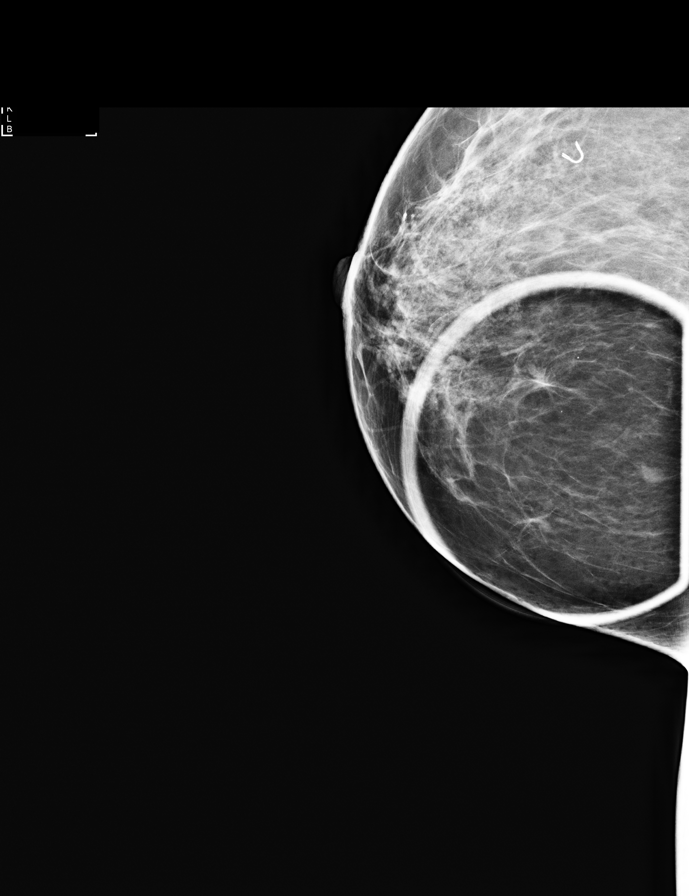

[L CC]
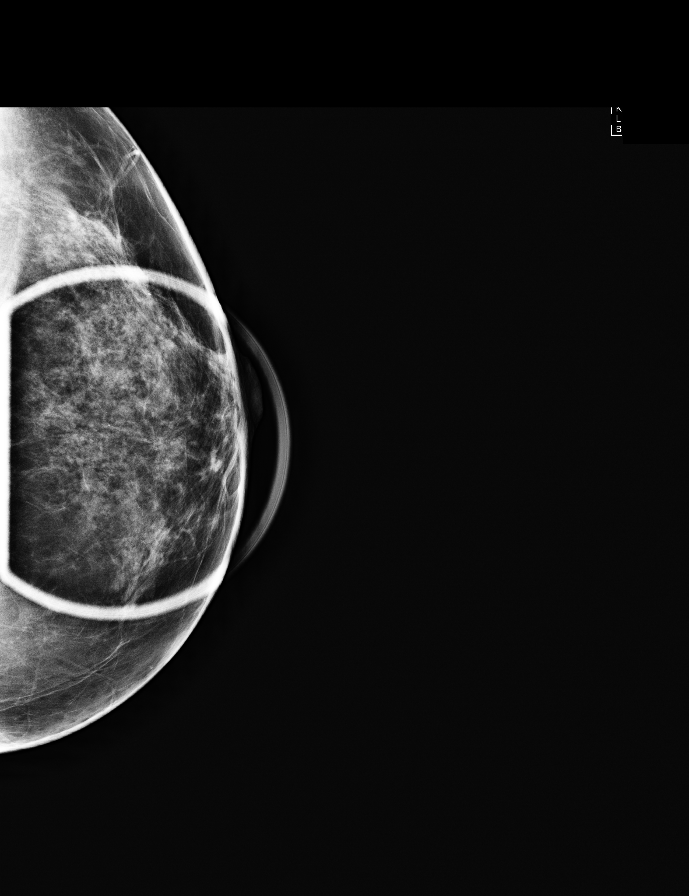

[R CC]
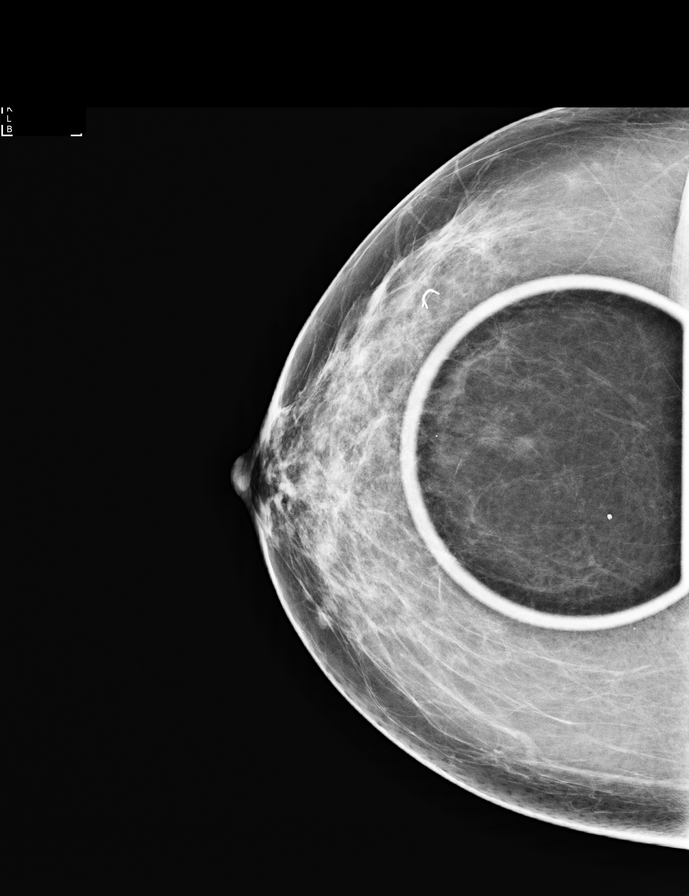

[R MLO synth-2D]
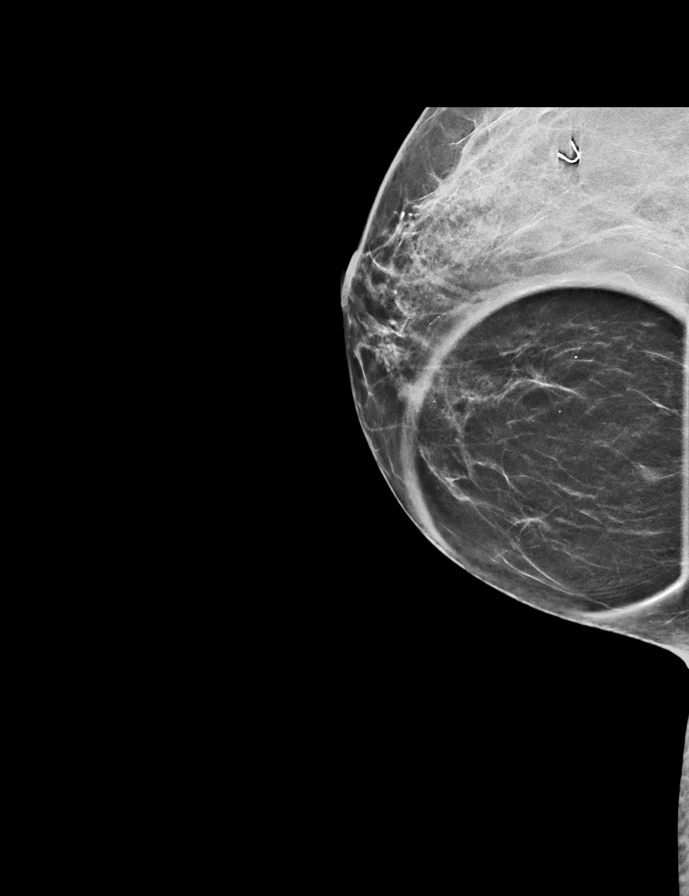

[8 of 32 positions shown; findings below may reference images not displayed]

ACR Breast Density Category b: There are scattered areas of
fibroglandular density.
FINDINGS: In the inferior slightly lateral aspect of the right breast, middle
depth there is a a 6 mm oval mass with indistinct margins. In the
subareolar left breast there is a persistent distortion which is
confirmed on the tomosynthesis images. The patient reports a
excisional biopsy in the subareolar left breast approximately 50
years ago.

Mammographic images were processed with CAD.

Physical exam of the medial aspect of the left breast demonstrates
no discrete palpable masses. Ultrasound targeted to the left breast
at 9 o'clock in the retroareolar location demonstrates a subtle
hypoechoic shadowing mass measuring up to 5 mm. A benign-appearing
cyst is seen just superior to this. Ultrasound of the left axilla
demonstrates multiple normal-appearing lymph nodes.

Ultrasound of the right breast at 7 o'clock, 4 cm from the nipple
demonstrates an anechoic circumscribed oval mass measuring 4 x 3 or
4 mm consistent with a benign cyst.
IMPRESSION: 1. The right breast mass identified on the screening mammogram
corresponds with a benign cyst.

2. There is a persistent distortion in the medial subareolar left
breast. This may be related to prior remote surgery, but was not
definitely seen on prior mammograms.

RECOMMENDATION:
1. Ultrasound-guided biopsy is recommended for the shadowing mass in
the left breast at 9 o'clock. Attention to the post biopsy mammogram
(tomosynthesis) is recommended to ensure that the clip lands at the
site of distortion seen mammographically. If it does not, a
follow-up stereotactic biopsy will be recommended.

At the patient's request, I of the results and recommendations of
this exam were discussed by phone with her out of state
granddaughter [REDACTED] at [phone].

I have discussed the findings and recommendations with the patient.
Results were also provided in writing at the conclusion of the
visit. If applicable, a reminder letter will be sent to the patient
regarding the next appointment.

BI-RADS CATEGORY  4: Suspicious.

## 2017-06-21 DIAGNOSIS — J45901 Unspecified asthma with (acute) exacerbation: Secondary | ICD-10-CM | POA: Diagnosis not present

## 2017-06-21 DIAGNOSIS — J329 Chronic sinusitis, unspecified: Secondary | ICD-10-CM | POA: Diagnosis not present

## 2017-11-30 ENCOUNTER — Encounter: Payer: Self-pay | Admitting: *Deleted

## 2017-12-01 ENCOUNTER — Ambulatory Visit: Payer: Medicare HMO | Admitting: Certified Registered"

## 2017-12-01 ENCOUNTER — Encounter
Admission: RE | Disposition: A | Discharge status: Home / Self Care | Payer: Self-pay | Source: Ambulatory Visit | Attending: Gastroenterology

## 2017-12-01 ENCOUNTER — Ambulatory Visit
Admission: RE | Admit: 2017-12-01 | Discharge: 2017-12-01 | Disposition: A | Discharge status: Home / Self Care | Payer: Medicare HMO | Source: Ambulatory Visit | Attending: Gastroenterology | Admitting: Gastroenterology

## 2017-12-01 ENCOUNTER — Encounter: Payer: Self-pay | Admitting: *Deleted

## 2017-12-01 DIAGNOSIS — K5730 Diverticulosis of large intestine without perforation or abscess without bleeding: Secondary | ICD-10-CM | POA: Diagnosis present

## 2017-12-01 DIAGNOSIS — I129 Hypertensive chronic kidney disease with stage 1 through stage 4 chronic kidney disease, or unspecified chronic kidney disease: Secondary | ICD-10-CM | POA: Diagnosis not present

## 2017-12-01 DIAGNOSIS — F419 Anxiety disorder, unspecified: Secondary | ICD-10-CM | POA: Diagnosis not present

## 2017-12-01 DIAGNOSIS — Z79899 Other long term (current) drug therapy: Secondary | ICD-10-CM | POA: Insufficient documentation

## 2017-12-01 DIAGNOSIS — N183 Chronic kidney disease, stage 3 (moderate): Secondary | ICD-10-CM | POA: Insufficient documentation

## 2017-12-01 DIAGNOSIS — M5136 Other intervertebral disc degeneration, lumbar region: Secondary | ICD-10-CM | POA: Insufficient documentation

## 2017-12-01 DIAGNOSIS — Z8673 Personal history of transient ischemic attack (TIA), and cerebral infarction without residual deficits: Secondary | ICD-10-CM | POA: Diagnosis not present

## 2017-12-01 DIAGNOSIS — K219 Gastro-esophageal reflux disease without esophagitis: Secondary | ICD-10-CM | POA: Diagnosis not present

## 2017-12-01 HISTORY — PX: COLONOSCOPY WITH PROPOFOL: SHX5780

## 2017-12-01 SURGERY — COLONOSCOPY WITH PROPOFOL
Anesthesia: General

## 2017-12-01 MED ORDER — SODIUM CHLORIDE 0.9 % IV SOLN
INTRAVENOUS | Status: DC
  Administered 2017-12-01: 07:00:00 via INTRAVENOUS

## 2017-12-01 MED ORDER — LIDOCAINE HCL (CARDIAC) PF 100 MG/5ML IV SOSY
PREFILLED_SYRINGE | INTRAVENOUS | Status: DC | PRN
  Administered 2017-12-01: 40 mg via INTRAVENOUS

## 2017-12-01 MED ORDER — PROPOFOL 10 MG/ML IV BOLUS
INTRAVENOUS | Status: AC
Start: 2017-12-01 — End: ?
  Filled 2017-12-01: qty 20

## 2017-12-01 MED ORDER — PROPOFOL 500 MG/50ML IV EMUL
INTRAVENOUS | Status: DC | PRN
  Administered 2017-12-01: 100 ug/kg/min via INTRAVENOUS

## 2017-12-01 MED ORDER — GLYCOPYRROLATE 0.2 MG/ML IJ SOLN
INTRAMUSCULAR | Status: AC
  Filled 2017-12-01: qty 1

## 2017-12-01 MED ORDER — LIDOCAINE HCL (PF) 2 % IJ SOLN
INTRAMUSCULAR | Status: AC
  Filled 2017-12-01: qty 10

## 2017-12-01 MED ORDER — PROPOFOL 500 MG/50ML IV EMUL
INTRAVENOUS | Status: AC
  Filled 2017-12-01: qty 50

## 2017-12-01 MED ORDER — PROPOFOL 10 MG/ML IV BOLUS
INTRAVENOUS | Status: DC | PRN
  Administered 2017-12-01: 50 mg via INTRAVENOUS
  Administered 2017-12-01: 100 mg via INTRAVENOUS

## 2017-12-01 MED ORDER — GLYCOPYRROLATE 0.2 MG/ML IJ SOLN
INTRAMUSCULAR | Status: DC | PRN
  Administered 2017-12-01: 0.1 mg via INTRAVENOUS

## 2017-12-01 NOTE — Anesthesia Postprocedure Evaluation (Signed)
Anesthesia Post Note  Patient: NATACIA CHAISSON  Procedure(s) Performed: COLONOSCOPY WITH PROPOFOL (N/A )  Patient location during evaluation: Endoscopy Anesthesia Type: General Level of consciousness: awake and alert, oriented and patient cooperative Pain management: satisfactory to patient Respiratory status: spontaneous breathing and respiratory function stable Cardiovascular status: blood pressure returned to baseline and stable Postop Assessment: patient able to bend at knees, no headache, no backache, no apparent nausea or vomiting, adequate PO intake and able to ambulate Anesthetic complications: no     Last Vitals:  Vitals:   12/01/17 0718 12/01/17 0750  BP: 139/81 (!) 93/55  Pulse: 64 85  Resp: 18 18  Temp: (!) 36.1 C (!) 36.1 C  SpO2: 100% 98%    Last Pain:  Vitals:   12/01/17 0750  TempSrc: Tympanic  PainSc:                  Clovis Riley Corra Kaine

## 2017-12-01 NOTE — Anesthesia Post-op Follow-up Note (Signed)
Anesthesia QCDR form completed.        

## 2017-12-01 NOTE — Anesthesia Preprocedure Evaluation (Addendum)
Anesthesia Evaluation  Patient identified by MRN, date of birth, ID band Patient awake    Reviewed: Allergy & Precautions, H&P , NPO status , Patient's Chart, lab work & pertinent test results, reviewed documented beta blocker date and time   History of Anesthesia Complications Negative for: history of anesthetic complications  Airway Mallampati: I   Neck ROM: full    Dental  (+) Poor Dentition, Teeth Intact   Pulmonary neg shortness of breath, asthma , neg COPD, neg recent URI,           Cardiovascular Exercise Tolerance: Good hypertension, (-) angina(-) CAD, (-) Past MI, (-) Cardiac Stents and (-) CABG (-) dysrhythmias (-) Valvular Problems/Murmurs     Neuro/Psych  Headaches, neg Seizures PSYCHIATRIC DISORDERS Anxiety Depression TIA Neuromuscular disease CVA, No Residual Symptoms    GI/Hepatic Neg liver ROS, hiatal hernia, GERD  Medicated,  Endo/Other  negative endocrine ROS  Renal/GU CRFRenal disease  negative genitourinary   Musculoskeletal   Abdominal   Peds  Hematology negative hematology ROS (+)   Anesthesia Other Findings Past Medical History: No date: Adenoma of colon No date: Allergic rhinitis No date: Anxiety No date: Arthritis No date: Asthma No date: Back pain No date: Carpal tunnel syndrome No date: Cervicalgia No date: Chronic kidney disease     Comment: STAGE 3 No date: DDD (degenerative disc disease), lumbar No date: GERD (gastroesophageal reflux disease) No date: Headache No date: History of hiatal hernia No date: Hypertension No date: Lipids serum increased No date: Lumbago No date: Migraines No date: Obesity No date: Osteoarthritis No date: Stroke [facility]) No date: TIA (transient ischemic attack) No date: Wrist fracture Past Surgical History: 2014: BREAST BIOPSY Right     Comment: NEG 1967: BREAST EXCISIONAL BIOPSY Left     Comment: NEG No date: cataracts Bilateral 05/03/2016:  COLONOSCOPY WITH PROPOFOL N/A     Comment: Procedure: COLONOSCOPY WITH PROPOFOL;                Surgeon: Lollie Sails, MD;  Location: [facility]              ENDOSCOPY;  Service: Endoscopy;  Laterality:               N/A; 10/26/2015: ESOPHAGOGASTRODUODENOSCOPY (EGD) WITH PROPOFOL N/A     Comment: Procedure: ESOPHAGOGASTRODUODENOSCOPY (EGD)               WITH PROPOFOL;  Surgeon: [name], MD;                Location: [facility] ENDOSCOPY;  Service:               Gastroenterology;  Laterality: N/A; No date: EYE SURGERY No date: OOPHORECTOMY     Comment: left   Reproductive/Obstetrics negative OB ROS                            Anesthesia Physical  Anesthesia Plan  ASA: III  Anesthesia Plan: General   Post-op Pain Management:    Induction: Intravenous  PONV Risk Score and Plan: 3 and Propofol infusion  Airway Management Planned: Natural Airway and Nasal Cannula  Additional Equipment:   Intra-op Plan:   Post-operative Plan:   Informed Consent: I have reviewed the patients History and Physical, chart, labs and discussed the procedure including the risks, benefits and alternatives for the proposed anesthesia with the patient or authorized representative who has indicated his/her understanding and acceptance.   Dental  Advisory Given  Plan Discussed with: CRNA  Anesthesia Plan Comments:         Anesthesia Quick Evaluation

## 2017-12-01 NOTE — H&P (Signed)
Outpatient short stay form Pre-procedure 12/01/2017 7:14 AM Jackie Sails MD  Primary Physician: Dr Tracie Harrier  Reason for visit: Colonoscopy  History of present illness: Patient is a 72 year old female presenting today as above.  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  She is presenting today for recheck in this regard.  Patient states she tolerated her prep well.  He takes no aspirin product or blood thinning agent. Patient did have some problems getting her proton pump inhibitor for her reflux symptoms at the beginning of the year however that is been straightened out at this point.  She is taking it appropriately and her symptoms have improved.  No nausea vomiting.  There is some left lower quadrant abdominal discomfort at times.  She has no issues with constipation diarrhea or rectal bleeding.   Current Facility-Administered Medications:  .  0.9 %  sodium chloride infusion, , Intravenous, Continuous, Jackie Sails, MD  Medications Prior to Admission  Medication Sig Dispense Refill Last Dose  . ALPRAZolam (XANAX) 0.25 MG tablet Take 0.25 mg by mouth at bedtime as needed for anxiety.     . fexofenadine (ALLEGRA) 60 MG tablet Take 60 mg by mouth 2 (two) times daily as needed for allergies or rhinitis.     . fluticasone (FLONASE) 50 MCG/ACT nasal spray Place 2 sprays into both nostrils daily.     Marland Kitchen lisinopril-hydrochlorothiazide (PRINZIDE,ZESTORETIC) 10-12.5 MG tablet Take 1 tablet by mouth daily.   12/01/2017 at Unknown time  . albuterol (VENTOLIN HFA) 108 (90 Base) MCG/ACT inhaler Inhale 2 puffs into the lungs every 4 (four) hours as needed for wheezing or shortness of breath.   05/02/2016 at Unknown time  . gabapentin (NEURONTIN) 300 MG capsule Take 300 mg by mouth 3 (three) times daily.   Past Week at Unknown time  . ibuprofen (ADVIL,MOTRIN) 200 MG tablet Take 200 mg by mouth every 6 (six) hours  as needed.   Past Week at Unknown time  . mometasone (NASONEX) 50 MCG/ACT nasal spray Place 2 sprays into the nose daily.   Past Week at Unknown time  . omega-3 acid ethyl esters (LOVAZA) 1 g capsule Take by mouth 2 (two) times daily.   Past Week at Unknown time  . pantoprazole (PROTONIX) 40 MG tablet Take 40 mg by mouth daily.   Past Week at Unknown time  . predniSONE (DELTASONE) 5 MG tablet    Past Week at Unknown time  . traMADol (ULTRAM) 50 MG tablet Take by mouth every 6 (six) hours as needed.   Past Week at Unknown time  . vitamin B-12 (CYANOCOBALAMIN) 1000 MCG tablet Take 1,000 mcg by mouth daily.   Past Week at Unknown time     Allergies  Allergen Reactions  . Levaquin [Levofloxacin In D5w] Other (See Comments)    dizziness  . Meloxicam   . Codeine Anxiety     Past Medical History:  Diagnosis Date  . Adenoma of colon   . Allergic rhinitis   . Anxiety   . Arthritis   . Asthma   . Back pain   . Carpal tunnel syndrome   . Cervicalgia   . Chronic kidney disease    STAGE 3  . DDD (degenerative disc disease), lumbar   . GERD (gastroesophageal reflux disease)   . Headache   . History of hiatal hernia   . Hypertension   . Lipids serum increased   . Lumbago   .  Migraines   . Obesity   . Osteoarthritis   . Stroke ([place])   . TIA (transient ischemic attack)   . Wrist fracture     Review of systems:      Physical Exam    Heart and lungs: Regular rate and rhythm without rub or gallop, lungs are bilaterally clear.    HEENT: Normocephalic atraumatic eyes are anicteric    Other:    Pertinant exam for procedure: Soft nontender nondistended bowel sounds positive normoactive    Planned proceedures: Colonoscopy and indicated procedures. I have discussed the risks benefits and complications of procedures to include not limited to bleeding, infection, perforation and the risk of sedation and the patient wishes to proceed.    Jackie Sails,  MD Gastroenterology 12/01/2017  7:14 AM

## 2017-12-01 NOTE — Op Note (Signed)
[facility] Gastroenterology Patient Name: Jackie Hunter Procedure Date: 12/01/2017 7:35 AM MRN: [id number] Account #: [id number] Date of Birth: 1946/05/18 Admit Type: Outpatient Age: 72 Room: [facility] ENDO ROOM 1 Gender: Female Note Status: Finalized Procedure:            Colonoscopy Indications:          Follow-up of diverticulitis Providers:            Lollie Sails, MD Referring MD:         Tracie Harrier, MD (Referring MD) Medicines:            Monitored Anesthesia Care Complications:        No immediate complications. Procedure:            Pre-Anesthesia Assessment:                       - ASA Grade Assessment: III - A patient with severe                        systemic disease.                       After obtaining informed consent, the colonoscope was                        passed under direct vision. Throughout the procedure,                        the patient's blood pressure, pulse, and oxygen                        saturations were monitored continuously. The                        Colonoscope was introduced through the anus with the                        intention of advancing to the cecum. The scope was                        advanced to the sigmoid colon before the procedure was                        aborted. Medications were given. The colonoscopy was                        extremely difficult due to poor bowel prep with stool                        present. The patient tolerated the procedure well. The                        quality of the bowel preparation was poor. Findings:      Many medium-mouthed diverticula were found in the sigmoid colon.      A moderate amount of semi-solid solid stool was found in the rectum and       in the sigmoid colon, precluding visualization. Impression:           - Preparation of the colon was poor.                       -  Diverticulosis in the sigmoid colon.                       - Stool in the rectum and  in the sigmoid colon.                       - No specimens collected. Recommendation:       - will need to repeat prep and reschedule. Procedure Code(s):    --- Professional ---                       [phone], 53, Colonoscopy, flexible; diagnostic, including                        collection of specimen(s) by brushing or washing, when                        performed (separate procedure) Diagnosis Code(s):    --- Professional ---                       V25.36, Diverticulitis of large intestine without                        perforation or abscess without bleeding                       K57.30, Diverticulosis of large intestine without                        perforation or abscess without bleeding CPT copyright 2017 American [employer]. All rights reserved. The codes documented in this report are preliminary and upon coder review may  be revised to meet current compliance requirements. Lollie Sails, MD 12/01/2017 7:53:39 AM This report has been signed electronically. Number of Addenda: 0 Note Initiated On: 12/01/2017 7:35 AM Total Procedure Duration: 0 hours 6 minutes 21 seconds       [facility]

## 2017-12-01 NOTE — Transfer of Care (Signed)
Immediate Anesthesia Transfer of Care Note  Patient: Jackie Hunter  Procedure(s) Performed: COLONOSCOPY WITH PROPOFOL (N/A )  Patient Location: PACU and Endoscopy Unit  Anesthesia Type:General  Level of Consciousness: drowsy and patient cooperative  Airway & Oxygen Therapy: Patient Spontanous Breathing  Post-op Assessment: Report given to RN, Post -op Vital signs reviewed and stable and Patient moving all extremities  Post vital signs: Reviewed and stable  Last Vitals:  Vitals Value Taken Time  BP 93/55 12/01/2017  7:52 AM  Temp 36.1 C 12/01/2017  7:50 AM  Pulse 83 12/01/2017  7:54 AM  Resp 16 12/01/2017  7:54 AM  SpO2 98 % 12/01/2017  7:54 AM  Vitals shown include unvalidated device data.  Last Pain:  Vitals:   12/01/17 0750  TempSrc: Tympanic  PainSc:          Complications: No apparent anesthesia complications

## 2017-12-04 ENCOUNTER — Encounter: Payer: Self-pay | Admitting: Gastroenterology

## 2018-03-01 ENCOUNTER — Encounter: Payer: Self-pay | Admitting: *Deleted

## 2018-03-02 ENCOUNTER — Ambulatory Visit: Payer: Medicare HMO | Admitting: Anesthesiology

## 2018-03-02 ENCOUNTER — Encounter: Payer: Self-pay | Admitting: *Deleted

## 2018-03-02 ENCOUNTER — Ambulatory Visit
Admission: RE | Admit: 2018-03-02 | Discharge: 2018-03-02 | Disposition: A | Discharge status: Home / Self Care | Payer: Medicare HMO | Source: Ambulatory Visit | Attending: Gastroenterology | Admitting: Gastroenterology

## 2018-03-02 ENCOUNTER — Other Ambulatory Visit: Payer: Self-pay

## 2018-03-02 ENCOUNTER — Encounter
Admission: RE | Disposition: A | Discharge status: Home / Self Care | Payer: Self-pay | Source: Ambulatory Visit | Attending: Gastroenterology

## 2018-03-02 DIAGNOSIS — Z79899 Other long term (current) drug therapy: Secondary | ICD-10-CM | POA: Diagnosis not present

## 2018-03-02 DIAGNOSIS — N183 Chronic kidney disease, stage 3 (moderate): Secondary | ICD-10-CM | POA: Diagnosis not present

## 2018-03-02 DIAGNOSIS — I129 Hypertensive chronic kidney disease with stage 1 through stage 4 chronic kidney disease, or unspecified chronic kidney disease: Secondary | ICD-10-CM | POA: Diagnosis not present

## 2018-03-02 DIAGNOSIS — K648 Other hemorrhoids: Secondary | ICD-10-CM | POA: Diagnosis not present

## 2018-03-02 DIAGNOSIS — Z86018 Personal history of other benign neoplasm: Secondary | ICD-10-CM | POA: Diagnosis not present

## 2018-03-02 DIAGNOSIS — Z8673 Personal history of transient ischemic attack (TIA), and cerebral infarction without residual deficits: Secondary | ICD-10-CM | POA: Insufficient documentation

## 2018-03-02 DIAGNOSIS — K5730 Diverticulosis of large intestine without perforation or abscess without bleeding: Secondary | ICD-10-CM | POA: Insufficient documentation

## 2018-03-02 DIAGNOSIS — Z7952 Long term (current) use of systemic steroids: Secondary | ICD-10-CM | POA: Insufficient documentation

## 2018-03-02 DIAGNOSIS — F329 Major depressive disorder, single episode, unspecified: Secondary | ICD-10-CM | POA: Diagnosis not present

## 2018-03-02 DIAGNOSIS — Z09 Encounter for follow-up examination after completed treatment for conditions other than malignant neoplasm: Secondary | ICD-10-CM | POA: Diagnosis not present

## 2018-03-02 DIAGNOSIS — K219 Gastro-esophageal reflux disease without esophagitis: Secondary | ICD-10-CM | POA: Diagnosis not present

## 2018-03-02 DIAGNOSIS — F419 Anxiety disorder, unspecified: Secondary | ICD-10-CM | POA: Diagnosis not present

## 2018-03-02 DIAGNOSIS — Z7951 Long term (current) use of inhaled steroids: Secondary | ICD-10-CM | POA: Insufficient documentation

## 2018-03-02 DIAGNOSIS — J45909 Unspecified asthma, uncomplicated: Secondary | ICD-10-CM | POA: Insufficient documentation

## 2018-03-02 HISTORY — DX: Gastritis, unspecified, without bleeding: K29.70

## 2018-03-02 HISTORY — DX: Diverticulosis of intestine, part unspecified, without perforation or abscess without bleeding: K57.90

## 2018-03-02 HISTORY — PX: COLONOSCOPY WITH PROPOFOL: SHX5780

## 2018-03-02 HISTORY — DX: Chronic kidney disease, unspecified: N18.9

## 2018-03-02 HISTORY — DX: Cortical age-related cataract, unspecified eye: H25.019

## 2018-03-02 HISTORY — DX: Diaphragmatic hernia without obstruction or gangrene: K44.9

## 2018-03-02 SURGERY — COLONOSCOPY WITH PROPOFOL
Anesthesia: General

## 2018-03-02 MED ORDER — SODIUM CHLORIDE 0.9 % IV SOLN
INTRAVENOUS | Status: DC
  Administered 2018-03-02: 09:00:00 via INTRAVENOUS

## 2018-03-02 MED ORDER — SODIUM CHLORIDE 0.9 % IV SOLN: INTRAVENOUS | Status: DC

## 2018-03-02 MED ORDER — PROPOFOL 500 MG/50ML IV EMUL
INTRAVENOUS | Status: DC | PRN
  Administered 2018-03-02: 150 ug/kg/min via INTRAVENOUS

## 2018-03-02 MED ORDER — PROPOFOL 10 MG/ML IV BOLUS
INTRAVENOUS | Status: DC | PRN
  Administered 2018-03-02: 50 mg via INTRAVENOUS
  Administered 2018-03-02: 30 mg via INTRAVENOUS

## 2018-03-02 NOTE — H&P (Signed)
Outpatient short stay form Pre-procedure 03/02/2018 9:24 AM Lollie Sails MD  Primary Physician: Dr Tracie Harrier  Reason for visit: colonoscopy  History of present illness: Patient is a 72 year old female presenting today as above.  She has a history of an atypical diverticular pocket in the a sending colon.  Presenting today for follow-up on this diverticulitis.  Tolerating her prep well.  She takes no aspirin or blood thinning agent.    Current Facility-Administered Medications:  .  0.9 %  sodium chloride infusion, , Intravenous, Continuous, Lollie Sails, MD .  0.9 %  sodium chloride infusion, , Intravenous, Continuous, Lollie Sails, MD, Last Rate: 20 mL/hr at 03/02/18 0900  Medications Prior to Admission  Medication Sig Dispense Refill Last Dose  . ALPRAZolam (XANAX) 0.25 MG tablet Take 0.25 mg by mouth at bedtime as needed for anxiety.   Past Week at Unknown time  . fexofenadine (ALLEGRA) 60 MG tablet Take 60 mg by mouth 2 (two) times daily as needed for allergies or rhinitis.   Past Month at Unknown time  . fluticasone (FLONASE) 50 MCG/ACT nasal spray Place 2 sprays into both nostrils daily.   Past Month at Unknown time  . gabapentin (NEURONTIN) 300 MG capsule Take 300 mg by mouth 3 (three) times daily.   Past Week at Unknown time  . ibuprofen (ADVIL,MOTRIN) 200 MG tablet Take 200 mg by mouth every 6 (six) hours as needed.   Past Month at Unknown time  . lisinopril-hydrochlorothiazide (PRINZIDE,ZESTORETIC) 10-12.5 MG tablet Take 1 tablet by mouth daily.   03/02/2018 at 0400  . mometasone (NASONEX) 50 MCG/ACT nasal spray Place 2 sprays into the nose daily.   Past Month at Unknown time  . omega-3 acid ethyl esters (LOVAZA) 1 g capsule Take by mouth 2 (two) times daily.   Past Month at Unknown time  . pantoprazole (PROTONIX) 40 MG tablet Take 40 mg by mouth daily.   Past Week at Unknown time  . traMADol (ULTRAM) 50 MG tablet Take by mouth every 6 (six) hours as needed.    Past Month at Unknown time  . vitamin B-12 (CYANOCOBALAMIN) 1000 MCG tablet Take 1,000 mcg by mouth daily.   Past Week at Unknown time  . albuterol (VENTOLIN HFA) 108 (90 Base) MCG/ACT inhaler Inhale 2 puffs into the lungs every 4 (four) hours as needed for wheezing or shortness of breath.   Not Taking at Unknown time  . buPROPion (ZYBAN) 150 MG 12 hr tablet Take 150 mg by mouth daily.   Not Taking at Unknown time  . predniSONE (DELTASONE) 5 MG tablet    Completed Course at Unknown time     Allergies  Allergen Reactions  . Levaquin [Levofloxacin In D5w] Other (See Comments)    dizziness  . Meloxicam   . Codeine Anxiety     Past Medical History:  Diagnosis Date  . Adenoma of colon   . Allergic rhinitis   . Anxiety   . Arthritis   . Asthma   . Back pain   . Carpal tunnel syndrome   . Carpal tunnel syndrome   . Cataract cortical, senile   . Cervicalgia   . Chronic kidney disease    STAGE 3  . CKD (chronic kidney disease)   . DDD (degenerative disc disease), lumbar   . DDD (degenerative disc disease), lumbar   . Diverticulosis   . Gastritis   . GERD (gastroesophageal reflux disease)   . Headache   . Hiatal hernia   .  History of hiatal hernia   . Hypertension   . Lipids serum increased   . Lumbago   . Migraines   . Obesity   . Osteoarthritis   . Stroke ([place])   . TIA (transient ischemic attack)   . Wrist fracture     Review of systems:      Physical Exam    Heart and lungs: Regular rate and rhythm without rub or gallop, lungs are bilaterally clear.    HEENT: Normocephalic atraumatic eyes are anicteric    Other:    Pertinant exam for procedure: Soft nontender nondistended bowel sounds positive normoactive.    Planned proceedures: Colonoscopy and indicated procedures. I have discussed the risks benefits and complications of procedures to include not limited to bleeding, infection, perforation and the risk of sedation and the patient wishes to  proceed.    Lollie Sails, MD Gastroenterology 03/02/2018  9:24 AM

## 2018-03-02 NOTE — Anesthesia Postprocedure Evaluation (Signed)
Anesthesia Post Note  Patient: Jackie Hunter  Procedure(s) Performed: COLONOSCOPY WITH PROPOFOL (N/A )  Patient location during evaluation: PACU Anesthesia Type: General Level of consciousness: awake and alert and oriented Pain management: pain level controlled Vital Signs Assessment: post-procedure vital signs reviewed and stable Respiratory status: spontaneous breathing Cardiovascular status: blood pressure returned to baseline Anesthetic complications: no     Last Vitals:  Vitals:   03/02/18 1018 03/02/18 1028  BP: 112/65   Pulse: (!) 59 (!) 55  Resp: (!) 21 14  Temp:    SpO2: 100% 100%    Last Pain:  Vitals:   03/02/18 1028  TempSrc:   PainSc: 0-No pain                 Jemar Paulsen

## 2018-03-02 NOTE — Anesthesia Procedure Notes (Signed)
Date/Time: 03/02/2018 9:31 AM Performed by: Nelda Marseille, CRNA Pre-anesthesia Checklist: Patient identified, Emergency Drugs available, Suction available, Patient being monitored and Timeout performed Oxygen Delivery Method: Nasal cannula

## 2018-03-02 NOTE — Op Note (Signed)
[facility] Gastroenterology Patient Name: Jackie Hunter Procedure Date: 03/02/2018 9:18 AM MRN: [id number] Account #: [id number] Date of Birth: 07/07/1946 Admit Type: Outpatient Age: 72 Room: [facility] ENDO ROOM 3 Gender: Female Note Status: Finalized Procedure:            Colonoscopy Indications:          Follow-up of diverticulitis Providers:            Lollie Sails, MD Referring MD:         Tracie Harrier, MD (Referring MD) Medicines:            Monitored Anesthesia Care Complications:        No immediate complications. Procedure:            Pre-Anesthesia Assessment:                       - ASA Grade Assessment: III - A patient with severe                        systemic disease.                       After obtaining informed consent, the colonoscope was                        passed under direct vision. Throughout the procedure,                        the patient's blood pressure, pulse, and oxygen                        saturations were monitored continuously. The                        Colonoscope was introduced through the anus and                        advanced to the the terminal ileum. The quality of the                        bowel preparation was good. Findings:      Many small and large-mouthed diverticula were found in the sigmoid       colon, descending colon, transverse colon and ascending colon.      A single large-mouthed diverticulum, previously noted, was found in the       mid ascending colon. This is inspissated with stool, though no evidence       of irritation or inflamation.      Non-bleeding internal hemorrhoids were found during retroflexion. The       hemorrhoids were small.      No additional abnormalities were found on retroflexion.      The digital rectal exam was normal. Impression:           - Diverticulosis in the sigmoid colon, in the                        descending colon, in the transverse colon and in the               ascending colon.                       -  Diverticulosis in the mid ascending colon.                       - Non-bleeding internal hemorrhoids.                       - No specimens collected. Recommendation:       - Discharge patient to home.                       - Use Citrucel one tablespoon PO daily. Procedure Code(s):    --- Professional ---                       [phone], Colonoscopy, flexible; diagnostic, including                        collection of specimen(s) by brushing or washing, when                        performed (separate procedure) Diagnosis Code(s):    --- Professional ---                       K64.8, Other hemorrhoids                       K57.32, Diverticulitis of large intestine without                        perforation or abscess without bleeding                       K57.30, Diverticulosis of large intestine without                        perforation or abscess without bleeding CPT copyright 2017 American Medical Association. All rights reserved. The codes documented in this report are preliminary and upon coder review may  be revised to meet current compliance requirements. Lollie Sails, MD 03/02/2018 10:03:18 AM This report has been signed electronically. Number of Addenda: 0 Note Initiated On: 03/02/2018 9:18 AM Scope Withdrawal Time: 0 hours 7 minutes 4 seconds  Total Procedure Duration: 0 hours 17 minutes 8 seconds       [facility]

## 2018-03-02 NOTE — Anesthesia Preprocedure Evaluation (Signed)
Anesthesia Evaluation  Patient identified by MRN, date of birth, ID band Patient awake    Reviewed: Allergy & Precautions, H&P , NPO status , Patient's Chart, lab work & pertinent test results, reviewed documented beta blocker date and time   History of Anesthesia Complications Negative for: history of anesthetic complications  Airway Mallampati: II   Neck ROM: full    Dental  (+) Poor Dentition, Teeth Intact   Pulmonary neg shortness of breath, asthma , neg COPD, neg recent URI,           Cardiovascular Exercise Tolerance: Good hypertension, (-) angina(-) CAD, (-) Past MI, (-) Cardiac Stents and (-) CABG (-) dysrhythmias (-) Valvular Problems/Murmurs     Neuro/Psych  Headaches, neg Seizures PSYCHIATRIC DISORDERS Anxiety Depression TIA Neuromuscular disease CVA, No Residual Symptoms    GI/Hepatic Neg liver ROS, hiatal hernia, GERD  Medicated,  Endo/Other  negative endocrine ROS  Renal/GU CRFRenal disease  negative genitourinary   Musculoskeletal   Abdominal   Peds  Hematology negative hematology ROS (+)   Anesthesia Other Findings Past Medical History: No date: Adenoma of colon No date: Allergic rhinitis No date: Anxiety No date: Arthritis No date: Asthma No date: Back pain No date: Carpal tunnel syndrome No date: Cervicalgia No date: Chronic kidney disease     Comment: STAGE 3 No date: DDD (degenerative disc disease), lumbar No date: GERD (gastroesophageal reflux disease) No date: Headache No date: History of hiatal hernia No date: Hypertension No date: Lipids serum increased No date: Lumbago No date: Migraines No date: Obesity No date: Osteoarthritis No date: Stroke [facility]) No date: TIA (transient ischemic attack) No date: Wrist fracture Past Surgical History: 2014: BREAST BIOPSY Right     Comment: NEG 1967: BREAST EXCISIONAL BIOPSY Left     Comment: NEG No date: cataracts Bilateral 05/03/2016:  COLONOSCOPY WITH PROPOFOL N/A     Comment: Procedure: COLONOSCOPY WITH PROPOFOL;                Surgeon: Lollie Sails, MD;  Location: [facility]              ENDOSCOPY;  Service: Endoscopy;  Laterality:               N/A; 10/26/2015: ESOPHAGOGASTRODUODENOSCOPY (EGD) WITH PROPOFOL N/A     Comment: Procedure: ESOPHAGOGASTRODUODENOSCOPY (EGD)               WITH PROPOFOL;  Surgeon: [name], MD;                Location: [facility] ENDOSCOPY;  Service:               Gastroenterology;  Laterality: N/A; No date: EYE SURGERY No date: OOPHORECTOMY     Comment: left   Reproductive/Obstetrics negative OB ROS                             Anesthesia Physical  Anesthesia Plan  ASA: III  Anesthesia Plan: General   Post-op Pain Management:    Induction: Intravenous  PONV Risk Score and Plan: 3 and Propofol infusion  Airway Management Planned: Natural Airway and Nasal Cannula  Additional Equipment:   Intra-op Plan:   Post-operative Plan:   Informed Consent: I have reviewed the patients History and Physical, chart, labs and discussed the procedure including the risks, benefits and alternatives for the proposed anesthesia with the patient or authorized representative who has indicated his/her understanding and acceptance.  Dental Advisory Given  Plan Discussed with: CRNA  Anesthesia Plan Comments:         Anesthesia Quick Evaluation

## 2018-03-02 NOTE — Anesthesia Post-op Follow-up Note (Signed)
Anesthesia QCDR form completed.        

## 2018-03-02 NOTE — Transfer of Care (Signed)
Immediate Anesthesia Transfer of Care Note  Patient: Jackie Hunter  Procedure(s) Performed: COLONOSCOPY WITH PROPOFOL (N/A )  Patient Location: PACU  Anesthesia Type:General  Level of Consciousness: sedated  Airway & Oxygen Therapy: Patient Spontanous Breathing and Patient connected to nasal cannula oxygen  Post-op Assessment: Report given to RN and Post -op Vital signs reviewed and stable  Post vital signs: Reviewed and stable  Last Vitals:  Vitals Value Taken Time  BP    Temp    Pulse    Resp    SpO2      Last Pain:  Vitals:   03/02/18 0852  TempSrc: Tympanic  PainSc: 0-No pain         Complications: No apparent anesthesia complications

## 2018-03-05 ENCOUNTER — Encounter: Payer: Self-pay | Admitting: Gastroenterology

## 2018-03-29 ENCOUNTER — Other Ambulatory Visit: Payer: Self-pay

## 2018-03-29 ENCOUNTER — Encounter: Payer: Self-pay | Admitting: Speech Pathology

## 2018-03-29 ENCOUNTER — Ambulatory Visit: Payer: Medicare HMO | Attending: Unknown Physician Specialty | Admitting: Speech Pathology

## 2018-03-29 DIAGNOSIS — R490 Dysphonia: Secondary | ICD-10-CM | POA: Diagnosis not present

## 2018-03-29 NOTE — Therapy (Signed)
[facility] MAIN [facility] SERVICES [address] [place], [place], [address] Phone: [phone]   Fax:  [phone]  Speech Language Pathology Evaluation  Patient Details  Name: Jackie Hunter MRN: [id number] Date of Birth: 1945-11-10 Referring Provider: Dr. Jenne Campus   Encounter Date: 03/29/2018  End of Session - 03/29/18 1653    Visit Number  1    Number of Visits  17    Date for SLP Re-Evaluation  05/30/18    SLP Start Time  0900    SLP Stop Time   1000    SLP Time Calculation (min)  60 min    Activity Tolerance  Patient tolerated treatment well       Past Medical History:  Diagnosis Date  . Adenoma of colon   . Allergic rhinitis   . Anxiety   . Arthritis   . Asthma   . Back pain   . Carpal tunnel syndrome   . Carpal tunnel syndrome   . Cataract cortical, senile   . Cervicalgia   . Chronic kidney disease    STAGE 3  . CKD (chronic kidney disease)   . DDD (degenerative disc disease), lumbar   . DDD (degenerative disc disease), lumbar   . Diverticulosis   . Gastritis   . GERD (gastroesophageal reflux disease)   . Headache   . Hiatal hernia   . History of hiatal hernia   . Hypertension   . Lipids serum increased   . Lumbago   . Migraines   . Obesity   . Osteoarthritis   . Stroke (HCC)   . TIA (transient ischemic attack)   . Wrist fracture     Past Surgical History:  Procedure Laterality Date  . BREAST BIOPSY Right 2014   NEG  . BREAST BIOPSY Left 11/24/2016   [place] core pending  . BREAST EXCISIONAL BIOPSY Left 1967   NEG  . cataracts Bilateral   . COLONOSCOPY WITH PROPOFOL N/A 05/03/2016   Procedure: COLONOSCOPY WITH PROPOFOL;  Surgeon: Christena Deem, MD;  Location: [facility] ENDOSCOPY;  Service: Endoscopy;  Laterality: N/A;  . COLONOSCOPY WITH PROPOFOL N/A 11/01/2016   Procedure: COLONOSCOPY WITH PROPOFOL;  Surgeon: Christena Deem, MD;  Location: [facility] ENDOSCOPY;  Service: Endoscopy;  Laterality: N/A;  . COLONOSCOPY  WITH PROPOFOL N/A 11/02/2016   Procedure: COLONOSCOPY WITH PROPOFOL;  Surgeon: Christena Deem, MD;  Location: [facility] ENDOSCOPY;  Service: Endoscopy;  Laterality: N/A;  . COLONOSCOPY WITH PROPOFOL N/A 12/01/2017   Procedure: COLONOSCOPY WITH PROPOFOL;  Surgeon: Christena Deem, MD;  Location: [facility] ENDOSCOPY;  Service: Endoscopy;  Laterality: N/A;  . COLONOSCOPY WITH PROPOFOL N/A 03/02/2018   Procedure: COLONOSCOPY WITH PROPOFOL;  Surgeon: Christena Deem, MD;  Location: [facility] ENDOSCOPY;  Service: Endoscopy;  Laterality: N/A;  . ESOPHAGOGASTRODUODENOSCOPY (EGD) WITH PROPOFOL N/A 10/26/2015   Procedure: ESOPHAGOGASTRODUODENOSCOPY (EGD) WITH PROPOFOL;  Surgeon: Wallace Cullens, MD;  Location: [facility] ENDOSCOPY;  Service: Gastroenterology;  Laterality: N/A;  . EYE SURGERY    . OOPHORECTOMY     left  . OOPHORECTOMY Left     There were no vitals filed for this visit.      SLP Evaluation [facility] - 03/29/18 0001      SLP Visit Information   SLP Received On  03/29/18    Referring Provider  Dr. Jenne Campus    Onset Date  03/20/2018    Medical Diagnosis  Hoarseness      Subjective   Subjective   "I'm  not very concerned about my voice"    Patient/Family Stated Goal  Improved vocal quality      General Information   HPI  72 year old woman, with hoarseness, referred by Dr. Jenne Campus for voice therapy.  Per report, abnormal laryngeal findings include: using false cords to speak.       Prior Functional Status   Cognitive/Linguistic Baseline  Within functional limits      Oral Motor/Sensory Function   Overall Oral Motor/Sensory Function  Appears within functional limits for tasks assessed      Motor Speech   Overall Motor Speech  Impaired    Respiration  Impaired    Level of Impairment  Sentence    Phonation  Breathy;Hoarse;Low vocal intensity    Resonance  Within functional limits    Articulation  Within functional limitis    Intelligibility  Intelligible    Phonation  Impaired    Vocal Abuses   Habitual Hyperphonia;Habitual Cough/Throat Clear;Vocal Fold Dehydration    Tension Present  Jaw;Neck;Shoulder    Pitch  Low      Standardized Assessments   Standardized Assessments   Other Assessment   Perceptual Voice Evaluation       Perceptual Voice Evaluation Voice checklist: . Health risks: GERD, allergies  . Characteristic voice use: patient states she works 2 days a week in [employer] and has occasional lengthy phone conversations  . Environmental risks: no significant environmental risks . Misuse: glottal fry . Abuse: some coughing/throat clearing . Vocal characteristics: breathy, hoarse, limited voice range, poor vocal projection, excessive pharyngeal resonance Patient quality of life survey: VHI-10: 12 (A score of 10 or higher indicate voice handicap) Maximum phonation time for sustained "ah": 10 seconds Average fundamental frequency during sustained "ah": 210 Hz (1.3 STD below average for gender) Habitual pitch: 194 Hz (1.8 STD below average for gender) Highest dynamic pitch when altering pitch from a low note to a high note: 401 Hz Lowest dynamic pitch when altering from a high note to a low note: 106 Hz Highest dynamic pitch in conversational speech: 688 Hz Lowest dynamic pitch in conversational speech: 96 Hz Average time patient was able to sustain /s/: 8.3 seconds Average time patient was able to sustain /z/: 5.3 seconds s/z ratio : 1.6 Visi-Pitch: Multi-Dimensional Voice Program (MDVP)  MDVPT extracts objective quantitative values (Relative Average Perturbation, Shimmer, Voice Turbulence Index, and Noise to Harmonic Ratio) on sustained phonation, which are displayed graphically and numerically in comparison to a built-in normative database.  The patient exhibited values outside the norm for Relative Average Perturbation and Shimmer.  Average fundamental frequency was 1.3 STD below average for age and gender.  Patient was minimally stimulable today. Education: Patient  instructed in extrinsic laryngeal muscle stretches and breath support exercises   SLP Education - 03/29/18 1652    Education Details  Results and recommendations/plan of care    Person(s) Educated  Patient    Methods  Explanation    Comprehension  Verbalized understanding         SLP Long Term Goals - 03/29/18 1655      SLP LONG TERM GOAL #1   Title  The patient will be independent for extrinsic laryngeal muscle stretches, abdominal breathing, and breath support exercises.    Time  8    Period  Weeks    Status  New    Target Date  05/30/18      SLP LONG TERM GOAL #2   Title  The patient will minimize vocal  tension via resonant voice therapy (or comparable technique) with min SLP cues with 80% accuracy.    Time  8    Period  Weeks    Status  New    Target Date  05/30/18      SLP LONG TERM GOAL #3   Title  The patient will maintain relaxed phonation / oral resonance for paragraph length recitation with 80% accuracy.    Time  8    Period  Weeks    Status  New    Target Date  05/30/18       Plan - 03/29/18 1653    Clinical Impression Statement  This 72 year old woman under the care of Dr. Jenne Campus, using false vocal cords for speech, is presenting with moderate dysphonia.  The patient demonstrates hoarse vocal quality, reduced breath control for speech, excessive pharyngeal resonance, strained/tense phonation, limited pitch range, vocal fatigue, and laryngeal tension. She will benefit from voice therapy for education, to improve breath support, improve tone focus, promote easy flow phonation, and learn techniques to increase loudness and pitch range without strain.  The patient will [place] out of the [place] until [place], we will plan to begin voice therapy at that time.  She has an appointment 05/30/2018.    Speech Therapy Frequency  2x / week    Duration  4 weeks    Treatment/Interventions  SLP instruction and feedback;Patient/family education;Other (comment)   Voice therapy    Potential to Achieve Goals  Good    Potential Considerations  Ability to learn/carryover information;Pain level;Family/community support;Co-morbidities;Previous level of function;Cooperation/participation level;Severity of impairments;Medical prognosis    SLP Home Exercise Plan  extrinsic laryngeal muscle stretches and breath support exercises, return 05/30/2018 for voice therapy    Consulted and Agree with Plan of Care  Patient       Patient will benefit from skilled therapeutic intervention in order to improve the following deficits and impairments:   Dysphonia - Plan: SLP plan of care cert/re-cert    Problem List Patient Active Problem List   Diagnosis Date Noted  . Migraines 02/18/2016  . CKD (chronic kidney disease) stage 3, GFR 30-59 ml/min (HCC) 08/13/2015  . Moderate episode of recurrent major depressive disorder (HCC) 08/13/2015  . DDD (degenerative disc disease), lumbar 04/02/2015  . Lumbar radiculitis 04/02/2015  . Lumbar stenosis with neurogenic claudication 04/02/2015  . Depressive disorder 11/05/2014  . Essential hypertension 11/05/2014  . Gastroesophageal reflux disease without esophagitis 11/05/2014  . Generalized osteoarthritis of multiple sites 11/05/2014  . Subcutaneous nodule 11/05/2014  . Anxiety associated with depression 06/25/2014  . Asthma without status asthmaticus 06/25/2014  . Benign essential hypertension 06/25/2014  . Other and unspecified hyperlipidemia 06/25/2014  . Frequency of micturition 11/29/2013  . Urinary urgency 11/29/2013  . Increased frequency of urination 11/29/2013  . Recurrent urinary tract infection 02/08/2013  . Urinary tract infection 02/08/2013   Dollene Primrose, MS/CCC- SLP  Leandrew Koyanagi 03/29/2018, 5:00 PM  [place] [facility] MAIN [facility] SERVICES [address] [place], [place], [address] Phone: [phone]   Fax:  [phone]  Name: Jackie Hunter MRN: [id number] Date of Birth:  01-04-1946

## 2018-04-03 ENCOUNTER — Ambulatory Visit: Payer: Medicare HMO | Admitting: Speech Pathology

## 2018-04-04 ENCOUNTER — Other Ambulatory Visit: Payer: Self-pay | Admitting: Gastroenterology

## 2018-04-04 DIAGNOSIS — K219 Gastro-esophageal reflux disease without esophagitis: Secondary | ICD-10-CM

## 2018-04-05 ENCOUNTER — Ambulatory Visit: Payer: Medicare HMO | Admitting: Speech Pathology

## 2018-04-06 ENCOUNTER — Ambulatory Visit
Admission: RE | Admit: 2018-04-06 | Discharge: 2018-04-06 | Disposition: A | Discharge status: Home / Self Care | Payer: Medicare HMO | Source: Ambulatory Visit | Attending: Gastroenterology | Admitting: Gastroenterology

## 2018-04-06 DIAGNOSIS — K219 Gastro-esophageal reflux disease without esophagitis: Secondary | ICD-10-CM | POA: Diagnosis not present

## 2018-04-06 DIAGNOSIS — N281 Cyst of kidney, acquired: Secondary | ICD-10-CM | POA: Insufficient documentation

## 2018-04-06 DIAGNOSIS — K760 Fatty (change of) liver, not elsewhere classified: Secondary | ICD-10-CM | POA: Insufficient documentation

## 2018-04-10 ENCOUNTER — Ambulatory Visit: Payer: Self-pay | Admitting: Speech Pathology

## 2018-04-12 ENCOUNTER — Ambulatory Visit: Payer: Self-pay | Admitting: Speech Pathology

## 2018-04-16 ENCOUNTER — Ambulatory Visit: Payer: Self-pay | Admitting: Speech Pathology

## 2018-04-19 ENCOUNTER — Ambulatory Visit: Payer: Self-pay | Admitting: Speech Pathology

## 2018-04-24 ENCOUNTER — Ambulatory Visit: Payer: Self-pay | Admitting: Speech Pathology

## 2018-04-27 ENCOUNTER — Ambulatory Visit: Payer: Self-pay | Admitting: Speech Pathology

## 2018-05-03 ENCOUNTER — Other Ambulatory Visit: Payer: Self-pay | Admitting: Gastroenterology

## 2018-05-03 DIAGNOSIS — R1011 Right upper quadrant pain: Secondary | ICD-10-CM

## 2018-05-30 ENCOUNTER — Ambulatory Visit: Payer: Medicare HMO | Attending: Unknown Physician Specialty | Admitting: Speech Pathology

## 2018-05-31 ENCOUNTER — Ambulatory Visit
Admission: RE | Admit: 2018-05-31 | Discharge: 2018-05-31 | Disposition: A | Discharge status: Home / Self Care | Payer: Medicare HMO | Source: Ambulatory Visit | Attending: Gastroenterology | Admitting: Gastroenterology

## 2018-05-31 DIAGNOSIS — R1011 Right upper quadrant pain: Secondary | ICD-10-CM | POA: Diagnosis not present

## 2018-05-31 MED ORDER — TECHNETIUM TC 99M MEBROFENIN IV KIT
5.0000 | PACK | Freq: Once | INTRAVENOUS | Status: AC | PRN
  Administered 2018-05-31: 5.46 via INTRAVENOUS

## 2018-11-09 IMAGING — US US ABDOMEN COMPLETE
1 series · 14 of 25 positions shown · non-contrast
Comparison: CT 08/08/2016

CLINICAL DATA: Reflux, right upper quadrant pain, epigastric pain

EXAM:
ABDOMEN ULTRASOUND COMPLETE

[Series 1: us abdomen complete · 14 of 123 slices shown]
[im 1/123]
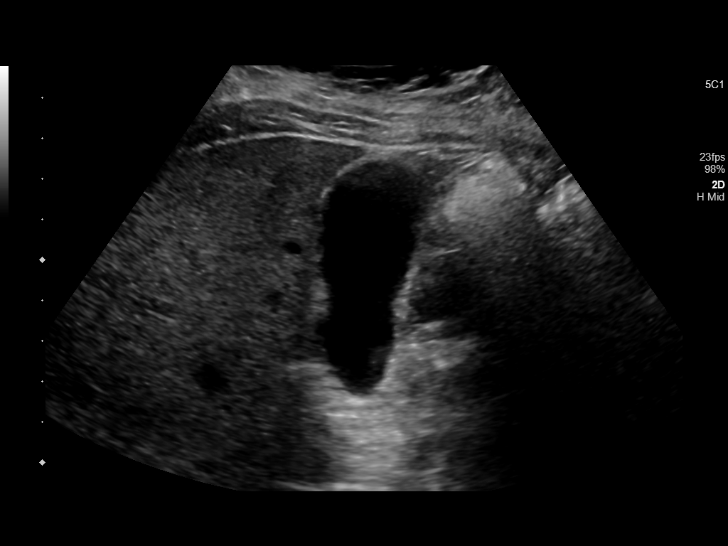
[im 11/123]
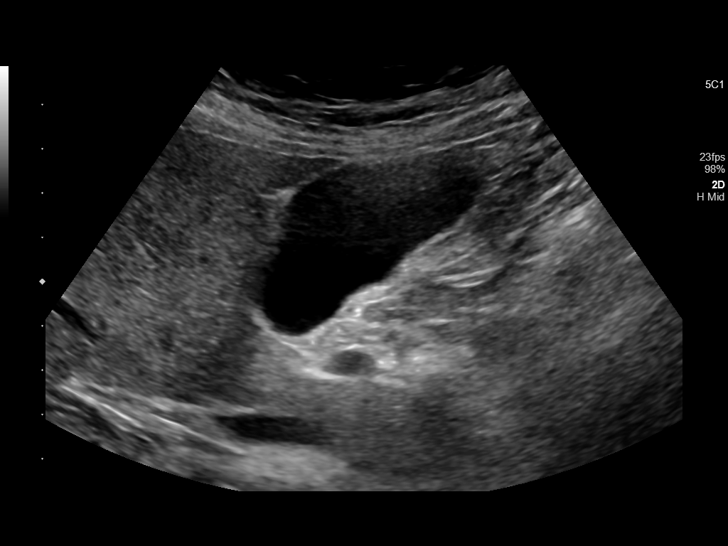
[im 21/123]
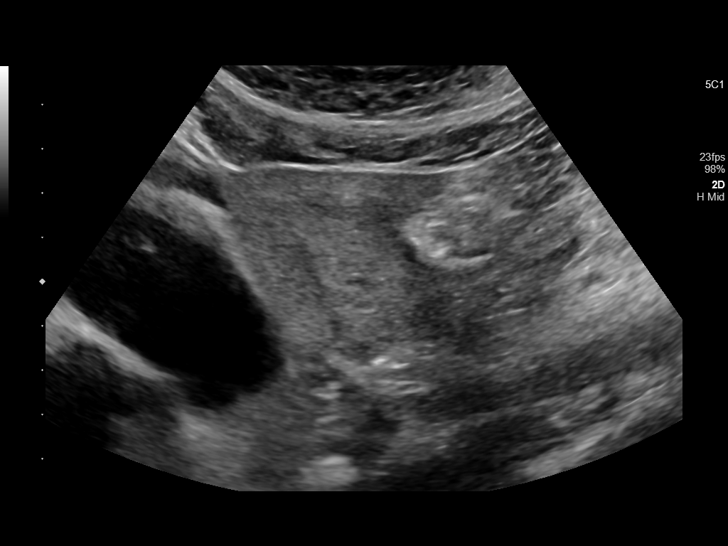
[im 31/123]
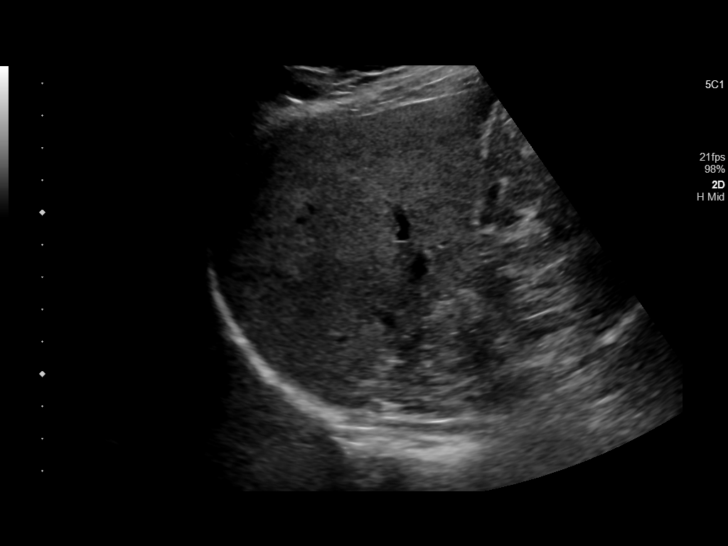
[im 41/123]
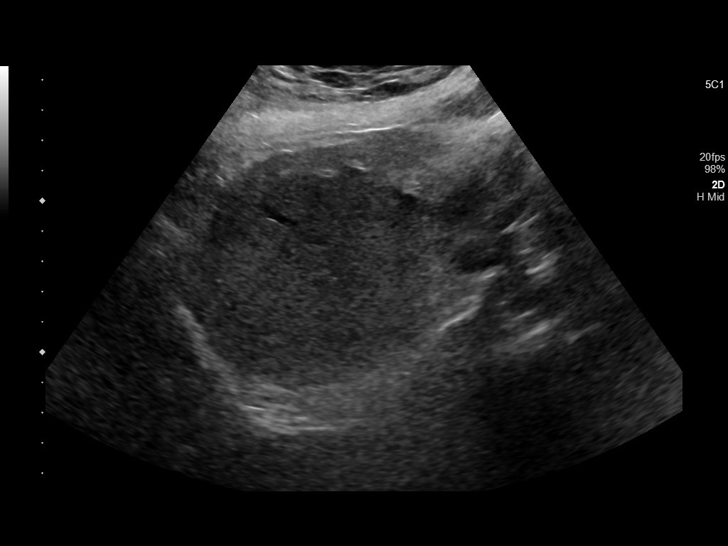
[im 46/123]
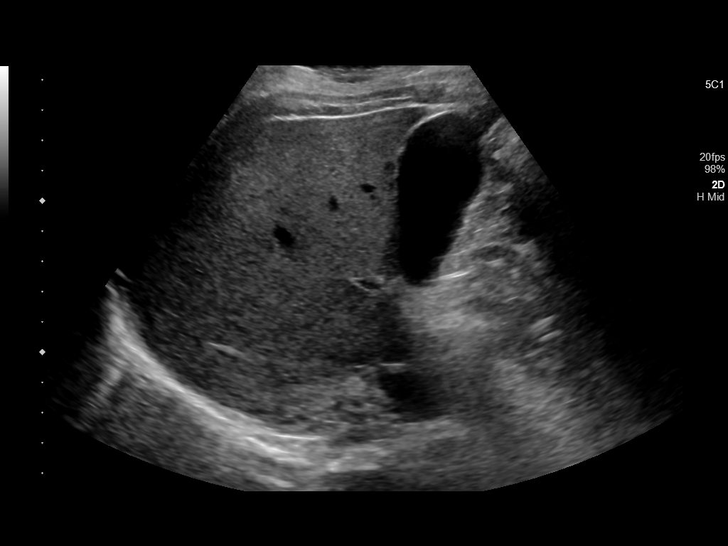
[im 56/123]
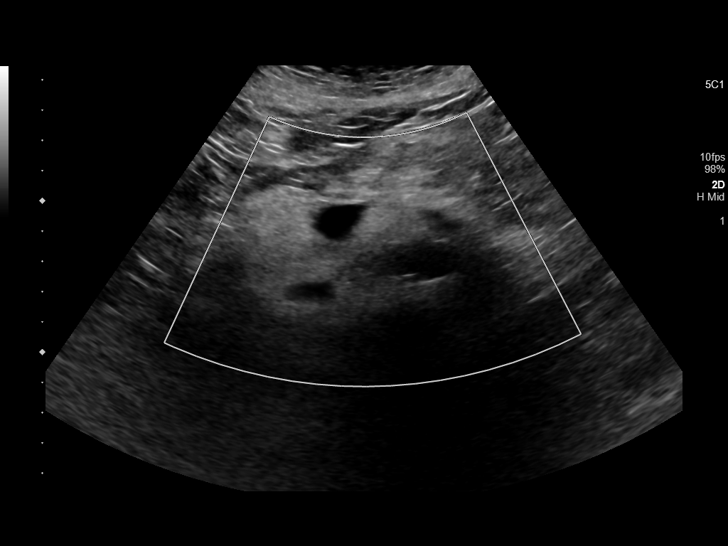
[im 67/123]
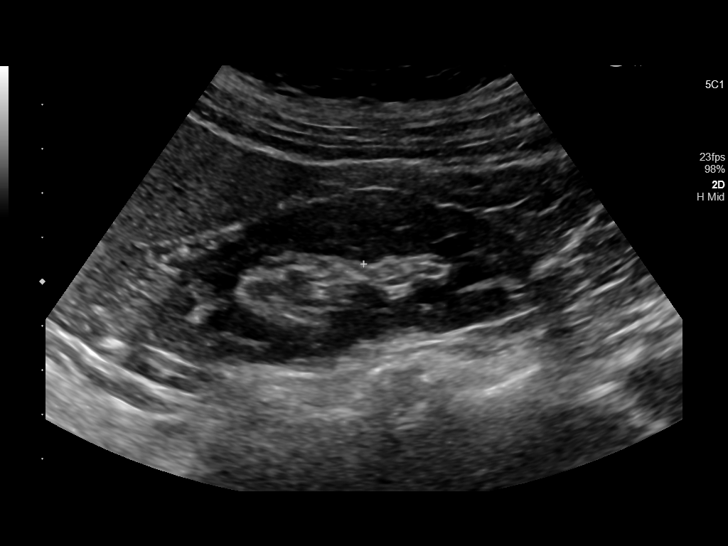
[im 77/123]
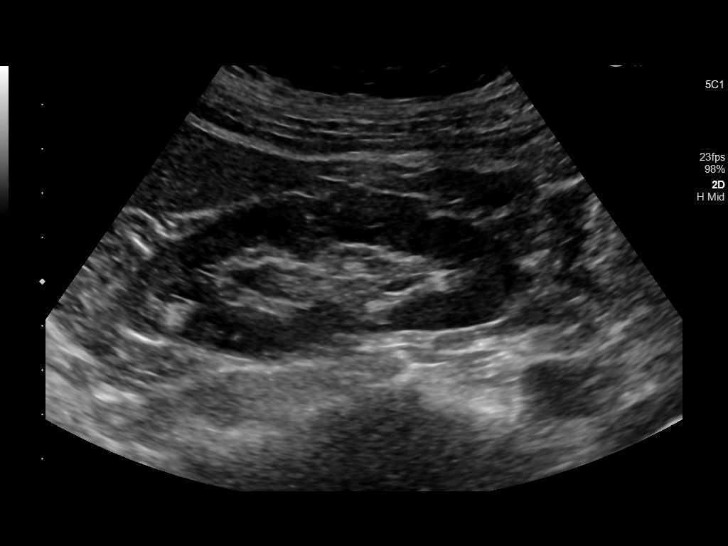
[im 82/123]
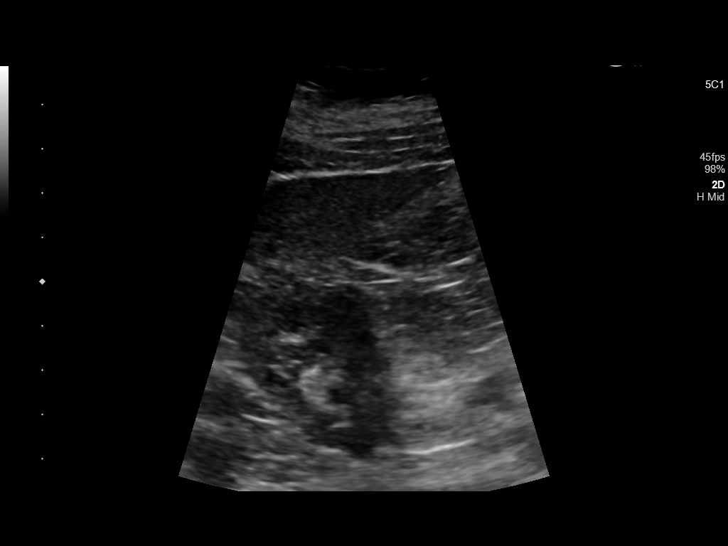
[im 92/123]
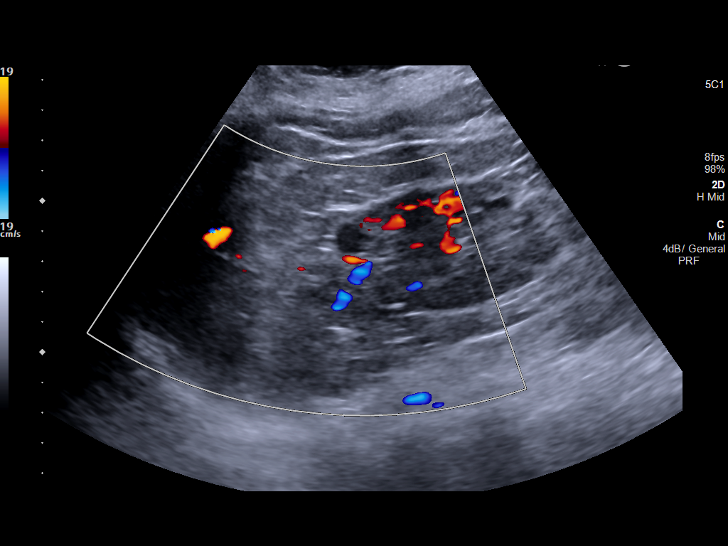
[im 102/123]
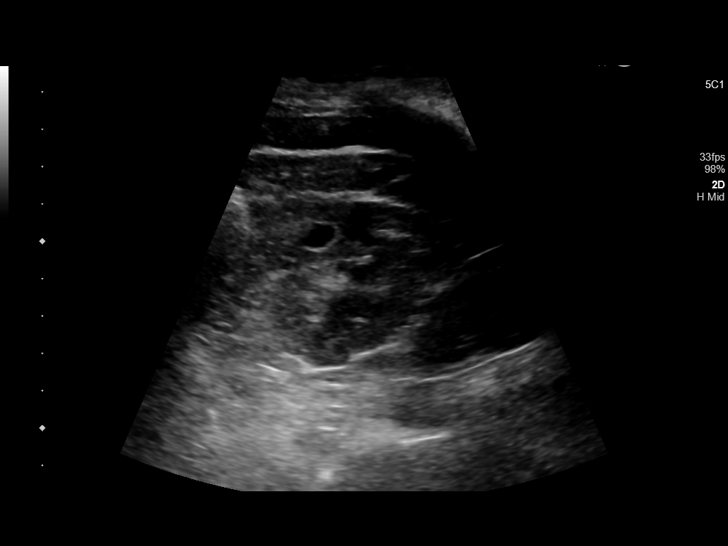
[im 112/123]
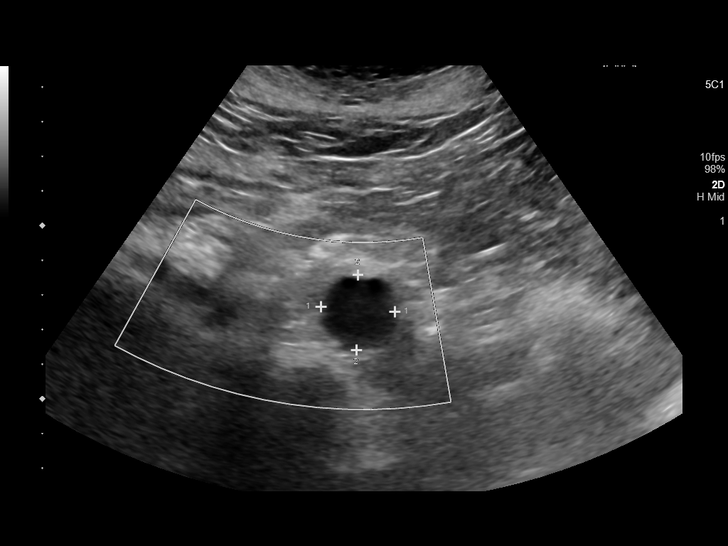
[im 123/123]
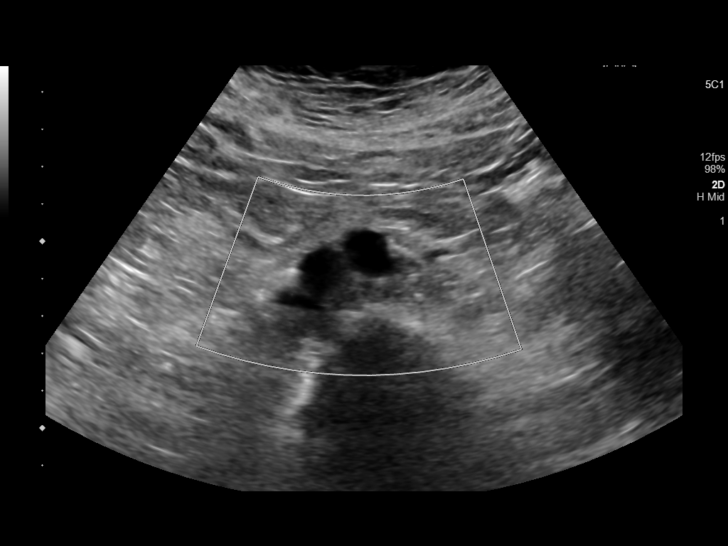

[14 of 25 positions shown; findings below may reference images not displayed]

FINDINGS: Gallbladder: No gallstones or wall thickening visualized. No
sonographic Murphy sign noted by sonographer.

Common bile duct: Diameter: Normal caliber, 2 mm

Liver: Increased echotexture compatible with fatty infiltration. No
focal abnormality or biliary ductal dilatation. Portal vein is
patent on color Doppler imaging with normal direction of blood flow
towards the liver.

IVC: No abnormality visualized.

Pancreas: Visualized portion unremarkable.

Spleen: Size and appearance within normal limits.

Right Kidney: Length: 9.2 cm. Small cysts, the largest 13 mm.
Echogenicity within normal limits. No mass or hydronephrosis
visualized.

Left Kidney: Length: 10.5 cm. Small cysts, the largest 13 mm.
Echogenicity within normal limits. No mass or hydronephrosis
visualized.

Abdominal aorta: No aneurysm visualized.

Other findings: None.
IMPRESSION: Fatty infiltration of the liver.

Small bilateral renal cysts.

No acute findings.

## 2019-01-01 ENCOUNTER — Other Ambulatory Visit: Payer: Self-pay | Admitting: Obstetrics and Gynecology

## 2019-01-01 DIAGNOSIS — N6489 Other specified disorders of breast: Secondary | ICD-10-CM

## 2019-01-08 ENCOUNTER — Other Ambulatory Visit: Payer: Self-pay

## 2019-01-08 ENCOUNTER — Ambulatory Visit
Admission: RE | Admit: 2019-01-08 | Discharge: 2019-01-08 | Disposition: A | Discharge status: Home / Self Care | Payer: Medicare HMO | Source: Ambulatory Visit | Attending: Obstetrics and Gynecology | Admitting: Obstetrics and Gynecology

## 2019-01-08 DIAGNOSIS — N6489 Other specified disorders of breast: Secondary | ICD-10-CM

## 2019-01-28 ENCOUNTER — Ambulatory Visit (INDEPENDENT_AMBULATORY_CARE_PROVIDER_SITE_OTHER): Payer: Medicare HMO | Admitting: Urology

## 2019-01-28 ENCOUNTER — Other Ambulatory Visit: Payer: Self-pay

## 2019-01-28 ENCOUNTER — Encounter: Payer: Self-pay | Admitting: Urology

## 2019-01-28 VITALS — BP 133/85 | HR 71 | Ht 63.0 in | Wt 180.0 lb

## 2019-01-28 DIAGNOSIS — R3129 Other microscopic hematuria: Secondary | ICD-10-CM

## 2019-01-28 DIAGNOSIS — N200 Calculus of kidney: Secondary | ICD-10-CM

## 2019-01-28 LAB — MICROSCOPIC EXAMINATION: Bacteria, UA: NONE SEEN

## 2019-01-28 LAB — URINALYSIS, COMPLETE
Bilirubin, UA: NEGATIVE
Glucose, UA: NEGATIVE
Ketones, UA: NEGATIVE
Leukocytes,UA: NEGATIVE
Nitrite, UA: NEGATIVE
Protein,UA: NEGATIVE
Specific Gravity, UA: 1.02 (ref 1.005–1.030)
Urobilinogen, Ur: 0.2 mg/dL (ref 0.2–1.0)
pH, UA: 5.5 (ref 5.0–7.5)

## 2019-01-28 NOTE — Progress Notes (Signed)
   01/28/2019 10:13 AM   Jackie Hunter 04-29-1946 [id number]  Reason for visit: Follow up microscopic hematuria  HPI: I saw Jackie Hunter in urology clinic today for follow-up of microscopic hematuria.  She is a healthy 73 year old female with a very long history of low-grade persistent microscopic hematuria with 4-10 RBCs per high-power field since at least 2015.  She has never had any gross hematuria.  She underwent microscopic hematuria work-up with Dr. Pilar Jarvis with CT urogram and cystoscopy in August 2017 which was completely negative.  She has no risk factors and is a never smoker and denies any carcinogenic exposures.  There are no aggravating or alleviating factors.  Severity is mild.  Duration is greater than 5 years.  ROS: Please see flowsheet from today's date for complete review of systems.  Physical Exam: BP 133/85 (BP Location: Left Arm, Patient Position: Sitting)   Pulse 71   Ht 5\' 3"  (1.6 m)   Wt 180 lb (81.6 kg)   BMI 31.89 kg/m    Constitutional:  Alert and oriented, No acute distress. Respiratory: Normal respiratory effort, no increased work of breathing. GI: Abdomen is soft, nontender, nondistended, no abdominal masses GU: No CVA tenderness Skin: No rashes, bruises or suspicious lesions. Neurologic: Grossly intact, no focal deficits, moving all 4 extremities. Psychiatric: Normal mood and affect  Laboratory Data: Reviewed, persistent microscopic hematuria with 4-10 RBCs per high-power field since at least 2015.  Pertinent Imaging: CT urogram from 2017 reviewed, no hydronephrosis, renal masses, or filling defects  Assessment & Plan:   In summary, the patient is a healthy 73 year old female with no risk factors for urothelial cell carcinoma with persistent low-grade microscopic hematuria since at least 2015, and negative hematuria work-up in 2017 with Dr. Pilar Jarvis.  We discussed at length the AUA guidelines regarding hematuria work-up.  We also discussed that she  does not warrant further repeat work-up with CTU and cystoscopy with her long history of low-grade microscopic hematuria, and recent negative work-up in August 2017.  We discussed the extremely low, but not 0, risk of missing a urologic malignancy by not repeating a work-up.  She is amenable to following up as needed, or if she were to develop gross hematuria.  Follow-up as needed, would only repeat CT urogram and cystoscopy if develops new gross hematuria  A total of 15 minutes were spent face-to-face with the patient, greater than 50% was spent in patient education, counseling, and coordination of care regarding microscopic hematuria and guidelines.   Billey Co, [facility] [address], [place], [place] [address] [phone]

## 2019-02-08 ENCOUNTER — Other Ambulatory Visit: Payer: Self-pay

## 2019-02-08 ENCOUNTER — Other Ambulatory Visit
Admission: RE | Admit: 2019-02-08 | Discharge: 2019-02-08 | Disposition: A | Discharge status: Home / Self Care | Payer: Medicare HMO | Source: Ambulatory Visit | Attending: Gastroenterology | Admitting: Gastroenterology

## 2019-02-08 DIAGNOSIS — Z1159 Encounter for screening for other viral diseases: Secondary | ICD-10-CM | POA: Diagnosis present

## 2019-02-09 LAB — SARS CORONAVIRUS 2 (TAT 6-24 HRS): SARS Coronavirus 2: NEGATIVE

## 2019-02-12 ENCOUNTER — Ambulatory Visit: Payer: Medicare HMO | Admitting: Anesthesiology

## 2019-02-12 ENCOUNTER — Encounter
Admission: RE | Disposition: A | Discharge status: Home / Self Care | Payer: Self-pay | Source: Home / Self Care | Attending: Gastroenterology

## 2019-02-12 ENCOUNTER — Ambulatory Visit
Admission: RE | Admit: 2019-02-12 | Discharge: 2019-02-12 | Disposition: A | Discharge status: Home / Self Care | Payer: Medicare HMO | Attending: Gastroenterology | Admitting: Gastroenterology

## 2019-02-12 ENCOUNTER — Encounter: Payer: Self-pay | Admitting: Anesthesiology

## 2019-02-12 DIAGNOSIS — Z8673 Personal history of transient ischemic attack (TIA), and cerebral infarction without residual deficits: Secondary | ICD-10-CM | POA: Diagnosis not present

## 2019-02-12 DIAGNOSIS — F419 Anxiety disorder, unspecified: Secondary | ICD-10-CM | POA: Insufficient documentation

## 2019-02-12 DIAGNOSIS — M1990 Unspecified osteoarthritis, unspecified site: Secondary | ICD-10-CM | POA: Insufficient documentation

## 2019-02-12 DIAGNOSIS — F329 Major depressive disorder, single episode, unspecified: Secondary | ICD-10-CM | POA: Insufficient documentation

## 2019-02-12 DIAGNOSIS — Z7952 Long term (current) use of systemic steroids: Secondary | ICD-10-CM | POA: Insufficient documentation

## 2019-02-12 DIAGNOSIS — Z791 Long term (current) use of non-steroidal anti-inflammatories (NSAID): Secondary | ICD-10-CM | POA: Diagnosis not present

## 2019-02-12 DIAGNOSIS — Z6831 Body mass index (BMI) 31.0-31.9, adult: Secondary | ICD-10-CM | POA: Insufficient documentation

## 2019-02-12 DIAGNOSIS — Z79899 Other long term (current) drug therapy: Secondary | ICD-10-CM | POA: Insufficient documentation

## 2019-02-12 DIAGNOSIS — K2970 Gastritis, unspecified, without bleeding: Secondary | ICD-10-CM | POA: Insufficient documentation

## 2019-02-12 DIAGNOSIS — R6881 Early satiety: Secondary | ICD-10-CM | POA: Diagnosis not present

## 2019-02-12 DIAGNOSIS — J45909 Unspecified asthma, uncomplicated: Secondary | ICD-10-CM | POA: Diagnosis not present

## 2019-02-12 DIAGNOSIS — I129 Hypertensive chronic kidney disease with stage 1 through stage 4 chronic kidney disease, or unspecified chronic kidney disease: Secondary | ICD-10-CM | POA: Diagnosis not present

## 2019-02-12 DIAGNOSIS — N183 Chronic kidney disease, stage 3 (moderate): Secondary | ICD-10-CM | POA: Insufficient documentation

## 2019-02-12 DIAGNOSIS — K210 Gastro-esophageal reflux disease with esophagitis: Secondary | ICD-10-CM | POA: Diagnosis not present

## 2019-02-12 DIAGNOSIS — E669 Obesity, unspecified: Secondary | ICD-10-CM | POA: Insufficient documentation

## 2019-02-12 DIAGNOSIS — K2980 Duodenitis without bleeding: Secondary | ICD-10-CM | POA: Diagnosis not present

## 2019-02-12 DIAGNOSIS — R1013 Epigastric pain: Secondary | ICD-10-CM | POA: Diagnosis not present

## 2019-02-12 HISTORY — PX: ESOPHAGOGASTRODUODENOSCOPY (EGD) WITH PROPOFOL: SHX5813

## 2019-02-12 SURGERY — ESOPHAGOGASTRODUODENOSCOPY (EGD) WITH PROPOFOL
Anesthesia: General

## 2019-02-12 MED ORDER — LIDOCAINE HCL (CARDIAC) PF 100 MG/5ML IV SOSY
PREFILLED_SYRINGE | INTRAVENOUS | Status: DC | PRN
  Administered 2019-02-12: 50 mg via INTRAVENOUS

## 2019-02-12 MED ORDER — FENTANYL CITRATE (PF) 100 MCG/2ML IJ SOLN
INTRAMUSCULAR | Status: DC | PRN
  Administered 2019-02-12: 50 ug via INTRAVENOUS

## 2019-02-12 MED ORDER — MIDAZOLAM HCL 2 MG/2ML IJ SOLN
INTRAMUSCULAR | Status: DC | PRN
  Administered 2019-02-12: 2 mg via INTRAVENOUS

## 2019-02-12 MED ORDER — PROPOFOL 500 MG/50ML IV EMUL
INTRAVENOUS | Status: DC | PRN
  Administered 2019-02-12: 100 ug/kg/min via INTRAVENOUS

## 2019-02-12 MED ORDER — BUTAMBEN-TETRACAINE-BENZOCAINE 2-2-14 % EX AERO
INHALATION_SPRAY | CUTANEOUS | Status: AC
  Filled 2019-02-12: qty 5

## 2019-02-12 MED ORDER — SODIUM CHLORIDE 0.9 % IV SOLN
INTRAVENOUS | Status: DC
  Administered 2019-02-12: 1000 mL via INTRAVENOUS

## 2019-02-12 MED ORDER — MIDAZOLAM HCL 2 MG/2ML IJ SOLN
INTRAMUSCULAR | Status: AC
  Filled 2019-02-12: qty 2

## 2019-02-12 MED ORDER — FENTANYL CITRATE (PF) 100 MCG/2ML IJ SOLN
INTRAMUSCULAR | Status: AC
  Filled 2019-02-12: qty 2

## 2019-02-12 MED ORDER — PROPOFOL 500 MG/50ML IV EMUL
INTRAVENOUS | Status: AC
  Filled 2019-02-12: qty 50

## 2019-02-12 MED ORDER — LIDOCAINE HCL (PF) 2 % IJ SOLN
INTRAMUSCULAR | Status: AC
  Filled 2019-02-12: qty 10

## 2019-02-12 NOTE — H&P (Signed)
Outpatient short stay form Pre-procedure 02/12/2019 10:24 AM Jackie Sails MD  Primary Physician: Dr Tracie Harrier  Reason for visit: EGD  History of present illness: Patient is a 73 year old female presenting today for an EGD in regards to recent onset of epigastric pain early satiety and increased GERD symptoms.  Some of this pain actually radiates around under her right rib cage and into her back.  She still has her gallbladder.  She did have an abdominal ultrasound about 10 months ago that was normal.  She had a HIDA scan with CCK stimulation about 7 months ago was also normal.  She does occasionally take NSAIDs.    Current Facility-Administered Medications:  .  0.9 %  sodium chloride infusion, , Intravenous, Continuous, Jackie Sails, MD, Last Rate: 20 mL/hr at 02/12/19 0954, 1,000 mL at 02/12/19 0954  Medications Prior to Admission  Medication Sig Dispense Refill Last Dose  . albuterol (VENTOLIN HFA) 108 (90 Base) MCG/ACT inhaler Inhale 2 puffs into the lungs every 4 (four) hours as needed for wheezing or shortness of breath.   Past Week at Unknown time  . ALPRAZolam (XANAX) 0.25 MG tablet Take 0.25 mg by mouth at bedtime as needed for anxiety.   Past Week at Unknown time  . buPROPion (ZYBAN) 150 MG 12 hr tablet Take 150 mg by mouth daily.   Past Week at Unknown time  . esomeprazole (NEXIUM) 40 MG capsule Take by mouth.   02/11/2019 at Unknown time  . fexofenadine (ALLEGRA) 60 MG tablet Take 60 mg by mouth 2 (two) times daily as needed for allergies or rhinitis.   Past Week at Unknown time  . fluticasone (FLONASE) 50 MCG/ACT nasal spray Place 2 sprays into both nostrils daily.   Past Week at Unknown time  . gabapentin (NEURONTIN) 300 MG capsule Take 300 mg by mouth 3 (three) times daily.   Past Week at Unknown time  . ibuprofen (ADVIL,MOTRIN) 200 MG tablet Take 200 mg by mouth every 6 (six) hours as needed.   Past Week at Unknown time  . lisinopril-hydrochlorothiazide  (PRINZIDE,ZESTORETIC) 10-12.5 MG tablet Take 1 tablet by mouth daily.   02/12/2019 at 0500  . meloxicam (MOBIC) 15 MG tablet meloxicam 15 mg tablet  TAKE 1 TABLET(S) EVERY DAY BY ORAL ROUTE WITH MEALS.   Past Week at Unknown time  . metroNIDAZOLE (FLAGYL) 250 MG tablet metronidazole 250 mg tablet   Past Week at Unknown time  . mometasone (NASONEX) 50 MCG/ACT nasal spray Place 2 sprays into the nose daily.   Past Week at Unknown time  . montelukast (SINGULAIR) 10 MG tablet Take by mouth.   Past Week at Unknown time  . Omega-3 1000 MG CAPS Take by mouth.   Past Week at Unknown time  . omega-3 acid ethyl esters (LOVAZA) 1 g capsule Take by mouth 2 (two) times daily.   Past Week at Unknown time  . pantoprazole (PROTONIX) 40 MG tablet Take 40 mg by mouth daily.   Past Week at Unknown time  . solifenacin (VESICARE) 5 MG tablet Take by mouth.   Past Week at Unknown time  . traMADol (ULTRAM) 50 MG tablet Take by mouth every 6 (six) hours as needed.   Past Week at Unknown time  . vitamin B-12 (CYANOCOBALAMIN) 1000 MCG tablet Take 1,000 mcg by mouth daily.   Past Week at Unknown time  . zolpidem (AMBIEN) 5 MG tablet zolpidem 5 mg tablet   Past Week at Unknown time  .  buPROPion (WELLBUTRIN XL) 300 MG 24 hr tablet Take by mouth.     . escitalopram (LEXAPRO) 10 MG tablet escitalopram 10 mg tablet     . predniSONE (DELTASONE) 5 MG tablet      . terconazole (TERAZOL 3) 80 MG vaginal suppository Place vaginally.        Allergies  Allergen Reactions  . Levaquin [Levofloxacin In D5w] Other (See Comments)    dizziness  . Meloxicam   . Codeine Anxiety     Past Medical History:  Diagnosis Date  . Adenoma of colon   . Allergic rhinitis   . Anxiety   . Arthritis   . Asthma   . Back pain   . Carpal tunnel syndrome   . Carpal tunnel syndrome   . Carpal tunnel syndrome   . Cataract cortical, senile   . Cervicalgia   . Chronic kidney disease    STAGE 3  . CKD (chronic kidney disease)   . DDD  (degenerative disc disease), lumbar   . DDD (degenerative disc disease), lumbar   . Diverticulosis   . Gastritis   . GERD (gastroesophageal reflux disease)   . Headache   . Hiatal hernia   . History of hiatal hernia   . Hypertension   . Lipids serum increased   . Lumbago   . Migraines   . Obesity   . Osteoarthritis   . Stroke ([place])   . TIA (transient ischemic attack)   . Wrist fracture     Review of systems:      Physical Exam    Heart and lungs: Regular rate and rhythm without rub or gallop lungs are bilaterally clear    HEENT: Normocephalic atraumatic eyes are anicteric    Other:    Pertinant exam for procedure: Soft nontender nondistended bowel sounds positive normoactive    Planned proceedures: EGD and indicated procedures. I have discussed the risks benefits and complications of procedures to include not limited to bleeding, infection, perforation and the risk of sedation and the patient wishes to proceed.    Jackie Sails, MD Gastroenterology 02/12/2019  10:24 AM

## 2019-02-12 NOTE — Anesthesia Postprocedure Evaluation (Signed)
Anesthesia Post Note  Patient: Jackie Hunter  Procedure(s) Performed: ESOPHAGOGASTRODUODENOSCOPY (EGD) WITH PROPOFOL (N/A )  Patient location during evaluation: Endoscopy Anesthesia Type: General Level of consciousness: awake and alert Pain management: pain level controlled Vital Signs Assessment: post-procedure vital signs reviewed and stable Respiratory status: spontaneous breathing and respiratory function stable Cardiovascular status: stable Anesthetic complications: no     Last Vitals:  Vitals:   02/12/19 0941 02/12/19 1052  BP: (!) 147/68 (!) 93/52  Pulse: 66 73  Resp: 20 15  Temp: (!) 35.9 C 36.5 C  SpO2: 99% 98%    Last Pain:  Vitals:   02/12/19 1052  TempSrc: Tympanic  PainSc: Asleep                 Louretta Tantillo K

## 2019-02-12 NOTE — Transfer of Care (Signed)
Immediate Anesthesia Transfer of Care Note  Patient: Jackie Hunter  Procedure(s) Performed: ESOPHAGOGASTRODUODENOSCOPY (EGD) WITH PROPOFOL (N/A )  Patient Location: PACU  Anesthesia Type:General  Level of Consciousness: awake and sedated  Airway & Oxygen Therapy: Patient Spontanous Breathing and Patient connected to nasal cannula oxygen  Post-op Assessment: Report given to RN and Post -op Vital signs reviewed and stable  Post vital signs: Reviewed and stable  Last Vitals:  Vitals Value Taken Time  BP    Temp    Pulse    Resp    SpO2      Last Pain:  Vitals:   02/12/19 0941  TempSrc: Tympanic  PainSc: 0-No pain         Complications: No apparent anesthesia complications

## 2019-02-12 NOTE — Op Note (Signed)
[facility] Gastroenterology Patient Name: Jackie Hunter Procedure Date: 02/12/2019 10:29 AM MRN: [id number] Account #: [id number] Date of Birth: [birth year] Admit Type: Outpatient Age: 73 Room: [facility] ENDO ROOM 3 Gender: Female Note Status: Finalized Procedure:            Upper GI endoscopy Indications:          Epigastric abdominal pain Providers:            Lollie Sails, MD Referring MD:         Tracie Harrier, MD (Referring MD) Medicines:            Monitored Anesthesia Care Complications:        No immediate complications. Procedure:            Pre-Anesthesia Assessment:                       - ASA Grade Assessment: III - A patient with severe                        systemic disease.                       After obtaining informed consent, the endoscope was                        passed under direct vision. Throughout the procedure,                        the patient's blood pressure, pulse, and oxygen                        saturations were monitored continuously. The Endoscope                        was introduced through the mouth, and advanced to the                        third part of duodenum. The upper GI endoscopy was                        accomplished without difficulty. The patient tolerated                        the procedure well. Findings:      The Z-line was variable. Biopsies were taken with a cold forceps for       histology.      The exam of the esophagus was otherwise normal.      Diffuse minimal inflammation characterized by congestion (edema) was       found in the gastric body. Biopsies were taken with a cold forceps for       histology. Biopsies also taken from the gastric antrum, placed into a       separate jar.      The cardia and gastric fundus were normal on retroflexion.      Diffuse mild mucosal variance characterized by smoothness was found in       the entire duodenum. Biopsies were taken with a cold forceps for   histology. Impression:           - Z-line variable. Biopsied.                       -  Gastritis. Biopsied.                       - Mucosal variant in the duodenum. Biopsied. Recommendation:       - Discharge patient to home.                       - Continue present medications.                       - Return to GI clinic in 2 weeks. Procedure Code(s):    --- Professional ---                       [phone], Esophagogastroduodenoscopy, flexible, transoral;                        with biopsy, single or multiple Diagnosis Code(s):    --- Professional ---                       K22.8, Other specified diseases of esophagus                       K29.70, Gastritis, unspecified, without bleeding                       K31.89, Other diseases of stomach and duodenum                       R10.13, Epigastric pain CPT copyright 2019 American Medical Association. All rights reserved. The codes documented in this report are preliminary and upon coder review may  be revised to meet current compliance requirements. Lollie Sails, MD 02/12/2019 10:51:08 AM This report has been signed electronically. Number of Addenda: 0 Note Initiated On: 02/12/2019 10:29 AM      [facility]

## 2019-02-12 NOTE — Anesthesia Preprocedure Evaluation (Signed)
Anesthesia Evaluation  Patient identified by MRN, date of birth, ID band Patient awake    Reviewed: Allergy & Precautions, NPO status , Patient's Chart, lab work & pertinent test results  History of Anesthesia Complications Negative for: history of anesthetic complications  Airway Mallampati: III       Dental   Pulmonary asthma , neg sleep apnea, neg COPD,           Cardiovascular hypertension, Pt. on medications (-) Past MI and (-) CHF (-) dysrhythmias (-) Valvular Problems/Murmurs     Neuro/Psych neg Seizures Anxiety Depression TIA (arm weakness, no residual)No Residual Symptoms    GI/Hepatic Neg liver ROS, hiatal hernia, GERD  Medicated,  Endo/Other  neg diabetes  Renal/GU Renal InsufficiencyRenal disease     Musculoskeletal   Abdominal   Peds  Hematology   Anesthesia Other Findings   Reproductive/Obstetrics                            Anesthesia Physical Anesthesia Plan  ASA: III  Anesthesia Plan: General   Post-op Pain Management:    Induction: Intravenous  PONV Risk Score and Plan: 3 and Propofol infusion, TIVA and Treatment may vary due to age or medical condition  Airway Management Planned: Nasal Cannula  Additional Equipment:   Intra-op Plan:   Post-operative Plan:   Informed Consent: I have reviewed the patients History and Physical, chart, labs and discussed the procedure including the risks, benefits and alternatives for the proposed anesthesia with the patient or authorized representative who has indicated his/her understanding and acceptance.       Plan Discussed with:   Anesthesia Plan Comments:         Anesthesia Quick Evaluation

## 2019-02-12 NOTE — Anesthesia Post-op Follow-up Note (Signed)
Anesthesia QCDR form completed.        

## 2019-02-12 NOTE — Anesthesia Procedure Notes (Signed)
Performed by: Cook[place]Martin, [place] Pre-anesthesia Checklist: Patient identified, Emergency Drugs available, Suction available, Patient being monitored and Timeout performed Patient Re-evaluated:Patient Re-evaluated prior to induction Oxygen Delivery Method: Nasal cannula Preoxygenation: Pre-oxygenation with 100% oxygen Induction Type: IV induction Airway Equipment and Method: Bite block Placement Confirmation: CO2 detector and positive ETCO2       

## 2019-02-13 ENCOUNTER — Encounter: Payer: Self-pay | Admitting: Gastroenterology

## 2019-02-13 LAB — SURGICAL PATHOLOGY

## 2019-02-27 ENCOUNTER — Other Ambulatory Visit: Payer: Self-pay | Admitting: Gastroenterology

## 2019-02-27 DIAGNOSIS — R6881 Early satiety: Secondary | ICD-10-CM

## 2019-02-27 DIAGNOSIS — R1013 Epigastric pain: Secondary | ICD-10-CM

## 2019-03-11 ENCOUNTER — Other Ambulatory Visit: Payer: Self-pay

## 2019-03-11 ENCOUNTER — Encounter
Admission: RE | Admit: 2019-03-11 | Discharge: 2019-03-11 | Disposition: A | Discharge status: Home / Self Care | Payer: Medicare HMO | Source: Ambulatory Visit | Attending: Gastroenterology | Admitting: Gastroenterology

## 2019-03-11 DIAGNOSIS — R6881 Early satiety: Secondary | ICD-10-CM | POA: Diagnosis present

## 2019-03-11 DIAGNOSIS — R1013 Epigastric pain: Secondary | ICD-10-CM | POA: Insufficient documentation

## 2019-03-11 MED ORDER — TECHNETIUM TC 99M SULFUR COLLOID
2.0000 | Freq: Once | INTRAVENOUS | Status: AC | PRN
  Administered 2019-03-11: 08:00:00 3.17 via ORAL

## 2019-04-05 ENCOUNTER — Other Ambulatory Visit: Payer: Self-pay | Admitting: Internal Medicine

## 2019-04-05 DIAGNOSIS — R2689 Other abnormalities of gait and mobility: Secondary | ICD-10-CM

## 2019-04-05 DIAGNOSIS — H538 Other visual disturbances: Secondary | ICD-10-CM

## 2019-04-05 DIAGNOSIS — R519 Headache, unspecified: Secondary | ICD-10-CM

## 2019-04-22 ENCOUNTER — Ambulatory Visit
Admission: RE | Admit: 2019-04-22 | Discharge: 2019-04-22 | Disposition: A | Discharge status: Home / Self Care | Payer: Medicare HMO | Source: Ambulatory Visit | Attending: Internal Medicine | Admitting: Internal Medicine

## 2019-04-22 ENCOUNTER — Other Ambulatory Visit: Payer: Medicare HMO

## 2019-04-22 ENCOUNTER — Ambulatory Visit: Payer: Medicare HMO

## 2019-04-22 ENCOUNTER — Other Ambulatory Visit: Payer: Self-pay

## 2019-04-22 DIAGNOSIS — R519 Headache, unspecified: Secondary | ICD-10-CM | POA: Diagnosis not present

## 2019-04-22 DIAGNOSIS — R2689 Other abnormalities of gait and mobility: Secondary | ICD-10-CM | POA: Insufficient documentation

## 2019-04-22 DIAGNOSIS — H538 Other visual disturbances: Secondary | ICD-10-CM | POA: Insufficient documentation

## 2019-08-03 DIAGNOSIS — K2970 Gastritis, unspecified, without bleeding: Secondary | ICD-10-CM | POA: Insufficient documentation

## 2019-08-03 DIAGNOSIS — K760 Fatty (change of) liver, not elsewhere classified: Secondary | ICD-10-CM | POA: Insufficient documentation

## 2019-11-05 ENCOUNTER — Ambulatory Visit: Payer: Medicare HMO | Admitting: Dermatology

## 2020-02-14 ENCOUNTER — Other Ambulatory Visit: Payer: Self-pay | Admitting: Internal Medicine

## 2020-02-14 DIAGNOSIS — Z1231 Encounter for screening mammogram for malignant neoplasm of breast: Secondary | ICD-10-CM

## 2020-03-03 ENCOUNTER — Ambulatory Visit
Admission: RE | Admit: 2020-03-03 | Discharge: 2020-03-03 | Disposition: A | Discharge status: Home / Self Care | Payer: Medicare HMO | Source: Ambulatory Visit | Attending: Internal Medicine | Admitting: Internal Medicine

## 2020-03-03 ENCOUNTER — Other Ambulatory Visit: Payer: Self-pay

## 2020-03-03 DIAGNOSIS — Z1231 Encounter for screening mammogram for malignant neoplasm of breast: Secondary | ICD-10-CM | POA: Diagnosis present

## 2020-04-13 ENCOUNTER — Other Ambulatory Visit: Payer: Self-pay

## 2020-04-13 ENCOUNTER — Ambulatory Visit (INDEPENDENT_AMBULATORY_CARE_PROVIDER_SITE_OTHER): Payer: Medicare HMO | Admitting: Dermatology

## 2020-04-13 DIAGNOSIS — L910 Hypertrophic scar: Secondary | ICD-10-CM

## 2020-04-13 DIAGNOSIS — L811 Chloasma: Secondary | ICD-10-CM

## 2020-04-13 DIAGNOSIS — D229 Melanocytic nevi, unspecified: Secondary | ICD-10-CM

## 2020-04-13 DIAGNOSIS — L609 Nail disorder, unspecified: Secondary | ICD-10-CM

## 2020-04-13 NOTE — Progress Notes (Signed)
Follow-Up Visit   Subjective  Jackie Hunter is a 74 y.o. female who presents for the following: keloids (L forearm, L breast, umbilicus, IL injections in past, ), melasma (face, Skin medicinal lightening cream with 0.025% tretinoin), and check toenail (L great toenail, color change and painful).  New dark patches on face- spent last couple months in [place].  Uses Elta MD clear lotion but not daily.   The following portions of the chart were reviewed this encounter and updated as appropriate:      Review of Systems:  No other skin or systemic complaints except as noted in HPI or Assessment and Plan.  Objective  Well appearing patient in no apparent distress; mood and affect are within normal limits.  A focused examination was performed including face, arm, trunk, L foot. Relevant physical exam findings are noted in the Assessment and Plan.  Objective  L lat cheek, R lat cheek, upper lip: Reticulated hyperpigmented patches L lat cheek, R lat cheek, upper lip, Medial cheeks are improved when compared to baseline photos. (pt was only treating that area)  Images      Objective  L breast, umbilicus: focal firm pink brown papules and plaques, L breast, umbilicus  Objective  R medial cheek: 2.24mm medium grey brown macule  Objective  L great toenail: Yellow brown discoloration ~80% of nail plate with thickening and subungual debris  Images     Assessment & Plan  Melasma L lat cheek, R lat cheek, upper lip  Improved on malar cheeks compared to photos from 09/09/19 in [facility]  Cont Skin Medicinals Hydroquinone 8%, Tretinoin 0.025%, Kojic acid 1%, Niacinamide 4%, Fluocinolone 0.025% cream, a pea sized amount nightly to dark spots on face for up to 2 months. This cannot be used more than 3 months due to risk of skin atrophy (thinning) and exogenous ochronosis (permanent dark spots).   Cont Elta clear sunscreen increase to daily use.  Keloid L breast, umbilicus  IL  steroid injection today.  Intralesional injection - L breast, umbilicus Location: L breast, umbilicus  Informed Consent: Discussed risks (infection, pain, bleeding, bruising, thinning of the skin, loss of skin pigment, lack of resolution, and recurrence of lesion) and benefits of the procedure, as well as the alternatives. Informed consent was obtained. Preparation: The area was prepared a standard fashion.  Anesthesia:none  Procedure Details: An intralesional injection was performed with Kenalog 40 mg/cc. 0.7 cc in total were injected.  Total number of injections: >7  Plan: The patient was instructed on post-op care. Recommend OTC analgesia as needed for pain.   Nevus R medial cheek  Vs SK  Benign appearing, observe  Nail problem L great toenail  Probable Onychomycosis  Discussed oral treatment (fluconazole weekly x 7-9 months vs terbinafine daily x 3 mos)  Pt hx of fatty liver, and has appointment today to get labs to check liver, will request baseline LFT labs from Dr. Ginette Pitman.  Pending lab results will decide treatment option.  Terbinafine Counseling  Terbinafine is an anti-fungal medicine that can be applied to the skin (over the counter) or taken by mouth (prescription) to treat fungal infections. The pill version is often used to treat fungal infections of the nails or scalp. While most people do not have any side effects from taking terbinafine pills, some possible side effects of the medicine can include taste changes, headache, loss of smell, vision changes, nausea, vomiting, or diarrhea.   Rare side effects can include irritation of the liver, allergic  reaction, or decrease in blood counts (which may show up as not feeling well or developing an infection). If you are concerned about any of these side effects, please stop the medicine and call your doctor, or in the case of an emergency such as feeling very unwell, seek immediate medical care.    Return in about 6  weeks (around 05/25/2020) for f/u toenail.   I, Othelia Pulling, RMA, am acting as scribe for Brendolyn Patty, MD . Documentation: I have reviewed the above documentation for accuracy and completeness, and I agree with the above.  Brendolyn Patty MD

## 2020-05-25 ENCOUNTER — Ambulatory Visit (INDEPENDENT_AMBULATORY_CARE_PROVIDER_SITE_OTHER): Payer: Medicare HMO | Admitting: Dermatology

## 2020-05-25 ENCOUNTER — Other Ambulatory Visit: Payer: Self-pay

## 2020-05-25 DIAGNOSIS — L609 Nail disorder, unspecified: Secondary | ICD-10-CM

## 2020-05-25 NOTE — Progress Notes (Signed)
   Follow-Up Visit   Subjective  Jackie Hunter is a 74 y.o. female who presents for the following: Onychomycosis (left great toenail). She has not started any oral medication for her toenail. She went to see Dr. Cleda Mccreedy for foot pain. She had an x-ray done of her foot and toe and has inflammation, for which she was treated with prednisone. Her left great toenail was cut back at that appointment and ingrown toenail treated. The following portions of the chart were reviewed this encounter and updated as appropriate:      Review of Systems:  No other skin or systemic complaints except as noted in HPI or Assessment and Plan.  Objective  Well appearing patient in no apparent distress; mood and affect are within normal limits.  A focused examination was performed including left foot, toenails. Relevant physical exam findings are noted in the Assessment and Plan.  Objective  Left Great Toenail: Nail thickening with brownish discoloration, subungual hyperkeratosis.  Images     Assessment & Plan  Nail problem Left Great Toenail  Probable Onychomycosis  04/17/20 labs reviewed and LFTs normal. Discussed oral terbinafine treatment. Patient not interested in taking at this time. She wants to wait and see how her nail does growing out after her recent procedure for ingrown toenail.  May consider in the future.  Jublia solution - apply to toenail qhs, sample given. Not covered by insurance so patient wants to try sample first. Lot #[id number] Exp 04/2020  Terbinafine Counseling  Terbinafine is an anti-fungal medicine that can be applied to the skin (over the counter) or taken by mouth (prescription) to treat fungal infections. The pill version is often used to treat fungal infections of the nails or scalp. While most people do not have any side effects from taking terbinafine pills, some possible side effects of the medicine can include taste changes, headache, loss of smell, vision changes,  nausea, vomiting, or diarrhea.   Rare side effects can include irritation of the liver, allergic reaction, or decrease in blood counts (which may show up as not feeling well or developing an infection). If you are concerned about any of these side effects, please stop the medicine and call your doctor, or in the case of an emergency such as feeling very unwell, seek immediate medical care.        Return in about 3 months (around 08/25/2020) for toenail.   IJamesetta Orleans, CMA, am acting as scribe for Brendolyn Patty, MD .  Documentation: I have reviewed the above documentation for accuracy and completeness, and I agree with the above.  Brendolyn Patty MD

## 2020-05-27 ENCOUNTER — Other Ambulatory Visit: Payer: Self-pay

## 2020-05-27 ENCOUNTER — Ambulatory Visit (INDEPENDENT_AMBULATORY_CARE_PROVIDER_SITE_OTHER): Payer: Medicare HMO | Admitting: Urology

## 2020-05-27 ENCOUNTER — Encounter: Payer: Self-pay | Admitting: Urology

## 2020-05-27 VITALS — BP 133/77 | HR 93 | Ht 63.0 in | Wt 180.2 lb

## 2020-05-27 DIAGNOSIS — R102 Pelvic and perineal pain unspecified side: Secondary | ICD-10-CM | POA: Insufficient documentation

## 2020-05-27 DIAGNOSIS — Z87440 Personal history of urinary (tract) infections: Secondary | ICD-10-CM

## 2020-05-27 LAB — BLADDER SCAN AMB NON-IMAGING

## 2020-05-27 NOTE — Patient Instructions (Addendum)
Agree with pursuing MRI and possibly starting Gabapentin for nerve pain follow up with physical therapy and PCP

## 2020-05-27 NOTE — Progress Notes (Signed)
   05/27/2020 2:29 PM   [name] 30-Aug-1945 [id number]  Reason for visit: Hip pain/leg pain/pelvic pain  HPI: Ms. Jackie Hunter was referred over to urology for the above issues.  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  She has chronic back problems and back pain that are managed with injections, and she has developed some left-sided hip pain that radiates down the left leg, and occasionally into the left labia.  She denies any urinary symptoms of hematuria or burning.  She does have some baseline urinary urgency and frequency, however she drinks 9+ bottles of water per day.  Urinalysis today is benign with 0-5 WBCs, 3-10 RBCs, few bacteria, nitrite negative, no leukocytes.  She has had no UTIs or positive cultures over the last year.  Reassurance was provided that I suspect her left-sided hip and leg pain is related to her pinched nerve in her back, and I agree with pursuing MRI with PCP.  We discussed gabapentin sometimes can be an effective pain medication for patients with neuropathic pain.  Follow-up with urology as needed.  Billey Co, [facility] [address], [place], [place] [address] [phone]

## 2020-05-28 LAB — URINALYSIS, COMPLETE
Bilirubin, UA: NEGATIVE
Glucose, UA: NEGATIVE
Ketones, UA: NEGATIVE
Leukocytes,UA: NEGATIVE
Nitrite, UA: NEGATIVE
Protein,UA: NEGATIVE
Specific Gravity, UA: 1.02 (ref 1.005–1.030)
Urobilinogen, Ur: 0.2 mg/dL (ref 0.2–1.0)
pH, UA: 6 (ref 5.0–7.5)

## 2020-05-28 LAB — MICROSCOPIC EXAMINATION

## 2020-05-29 ENCOUNTER — Other Ambulatory Visit (HOSPITAL_COMMUNITY): Payer: Self-pay | Admitting: Physical Medicine and Rehabilitation

## 2020-05-29 ENCOUNTER — Other Ambulatory Visit: Payer: Self-pay | Admitting: Physical Medicine and Rehabilitation

## 2020-05-29 DIAGNOSIS — M5441 Lumbago with sciatica, right side: Secondary | ICD-10-CM

## 2020-06-12 ENCOUNTER — Ambulatory Visit (HOSPITAL_COMMUNITY): Payer: Medicare HMO

## 2020-06-12 ENCOUNTER — Other Ambulatory Visit: Payer: Self-pay

## 2020-06-12 ENCOUNTER — Ambulatory Visit
Admission: RE | Admit: 2020-06-12 | Discharge: 2020-06-12 | Disposition: A | Discharge status: Home / Self Care | Payer: Medicare HMO | Source: Ambulatory Visit | Attending: Physical Medicine and Rehabilitation | Admitting: Physical Medicine and Rehabilitation

## 2020-06-12 DIAGNOSIS — M5442 Lumbago with sciatica, left side: Secondary | ICD-10-CM | POA: Diagnosis not present

## 2020-06-12 DIAGNOSIS — M5441 Lumbago with sciatica, right side: Secondary | ICD-10-CM | POA: Diagnosis present

## 2020-08-31 ENCOUNTER — Ambulatory Visit: Payer: Medicare HMO | Admitting: Dermatology

## 2020-09-01 ENCOUNTER — Emergency Department
Admission: EM | Admit: 2020-09-01 | Discharge: 2020-09-01 | Disposition: A | Discharge status: Home / Self Care | Payer: 59 | Attending: Emergency Medicine | Admitting: Emergency Medicine

## 2020-09-01 ENCOUNTER — Other Ambulatory Visit: Payer: Self-pay

## 2020-09-01 ENCOUNTER — Emergency Department: Payer: 59

## 2020-09-01 DIAGNOSIS — J45909 Unspecified asthma, uncomplicated: Secondary | ICD-10-CM | POA: Diagnosis not present

## 2020-09-01 DIAGNOSIS — Z79899 Other long term (current) drug therapy: Secondary | ICD-10-CM | POA: Diagnosis not present

## 2020-09-01 DIAGNOSIS — R0789 Other chest pain: Secondary | ICD-10-CM | POA: Diagnosis not present

## 2020-09-01 DIAGNOSIS — K2970 Gastritis, unspecified, without bleeding: Secondary | ICD-10-CM | POA: Diagnosis not present

## 2020-09-01 DIAGNOSIS — N644 Mastodynia: Secondary | ICD-10-CM | POA: Insufficient documentation

## 2020-09-01 DIAGNOSIS — N183 Chronic kidney disease, stage 3 unspecified: Secondary | ICD-10-CM | POA: Insufficient documentation

## 2020-09-01 DIAGNOSIS — R1013 Epigastric pain: Secondary | ICD-10-CM | POA: Diagnosis present

## 2020-09-01 DIAGNOSIS — I129 Hypertensive chronic kidney disease with stage 1 through stage 4 chronic kidney disease, or unspecified chronic kidney disease: Secondary | ICD-10-CM | POA: Diagnosis not present

## 2020-09-01 LAB — URINALYSIS, COMPLETE (UACMP) WITH MICROSCOPIC
Bilirubin Urine: NEGATIVE
Glucose, UA: NEGATIVE mg/dL
Ketones, ur: NEGATIVE mg/dL
Leukocytes,Ua: NEGATIVE
Nitrite: NEGATIVE
Protein, ur: NEGATIVE mg/dL
Specific Gravity, Urine: 1.011 (ref 1.005–1.030)
pH: 6 (ref 5.0–8.0)

## 2020-09-01 LAB — BASIC METABOLIC PANEL
Anion gap: 12 (ref 5–15)
BUN: 20 mg/dL (ref 8–23)
CO2: 23 mmol/L (ref 22–32)
Calcium: 9 mg/dL (ref 8.9–10.3)
Chloride: 101 mmol/L (ref 98–111)
Creatinine, Ser: 1.32 mg/dL — ABNORMAL HIGH (ref 0.44–1.00)
GFR, Estimated: 42 mL/min — ABNORMAL LOW (ref >60)
Glucose, Bld: 133 mg/dL — ABNORMAL HIGH (ref 70–99)
Potassium: 3.5 mmol/L (ref 3.5–5.1)
Sodium: 136 mmol/L (ref 135–145)

## 2020-09-01 LAB — CBC WITH DIFFERENTIAL/PLATELET
Abs Immature Granulocytes: 0.03 10*3/uL (ref 0.00–0.07)
Basophils Absolute: 0 10*3/uL (ref 0.0–0.1)
Basophils Relative: 1 %
Eosinophils Absolute: 0.2 10*3/uL (ref 0.0–0.5)
Eosinophils Relative: 3 %
HCT: 42.4 % (ref 36.0–46.0)
Hemoglobin: 14.1 g/dL (ref 12.0–15.0)
Immature Granulocytes: 0 %
Lymphocytes Relative: 38 %
Lymphs Abs: 2.8 10*3/uL (ref 0.7–4.0)
MCH: 30.3 pg (ref 26.0–34.0)
MCHC: 33.3 g/dL (ref 30.0–36.0)
MCV: 91.2 fL (ref 80.0–100.0)
Monocytes Absolute: 0.5 10*3/uL (ref 0.1–1.0)
Monocytes Relative: 6 %
Neutro Abs: 3.9 10*3/uL (ref 1.7–7.7)
Neutrophils Relative %: 52 %
Platelets: 405 10*3/uL — ABNORMAL HIGH (ref 150–400)
RBC: 4.65 MIL/uL (ref 3.87–5.11)
RDW: 14.4 % (ref 11.5–15.5)
WBC: 7.5 10*3/uL (ref 4.0–10.5)
nRBC: 0 % (ref 0.0–0.2)

## 2020-09-01 LAB — HEPATIC FUNCTION PANEL
ALT: 18 U/L (ref 0–44)
AST: 22 U/L (ref 15–41)
Albumin: 4 g/dL (ref 3.5–5.0)
Alkaline Phosphatase: 52 U/L (ref 38–126)
Bilirubin, Direct: 0.1 mg/dL (ref 0.0–0.2)
Indirect Bilirubin: 0.8 mg/dL (ref 0.3–0.9)
Total Bilirubin: 0.9 mg/dL (ref 0.3–1.2)
Total Protein: 7.3 g/dL (ref 6.5–8.1)

## 2020-09-01 LAB — TROPONIN I (HIGH SENSITIVITY): Troponin I (High Sensitivity): 3 ng/L (ref <18)

## 2020-09-01 LAB — FIBRIN DERIVATIVES D-DIMER (ARMC ONLY): Fibrin derivatives D-dimer (ARMC): 618.25 ng/mL (FEU) — ABNORMAL HIGH (ref 0.00–499.00)

## 2020-09-01 LAB — LIPASE, BLOOD: Lipase: 45 U/L (ref 11–51)

## 2020-09-01 MED ORDER — SODIUM CHLORIDE 0.9 % IV BOLUS
500.0000 mL | Freq: Once | INTRAVENOUS | Status: AC
  Administered 2020-09-01: 500 mL via INTRAVENOUS

## 2020-09-01 MED ORDER — METOCLOPRAMIDE HCL 5 MG/ML IJ SOLN
10.0000 mg | Freq: Once | INTRAMUSCULAR | Status: AC
  Administered 2020-09-01: 10 mg via INTRAVENOUS
  Filled 2020-09-01: qty 2

## 2020-09-01 MED ORDER — METOCLOPRAMIDE HCL 10 MG PO TABS
10.0000 mg | ORAL_TABLET | Freq: Three times a day (TID) | ORAL | 0 refills | Status: DC | PRN
Start: 2020-09-01 — End: 2020-12-31

## 2020-09-01 MED ORDER — IOHEXOL 350 MG/ML SOLN
60.0000 mL | Freq: Once | INTRAVENOUS | Status: AC | PRN
  Administered 2020-09-01: 60 mL via INTRAVENOUS

## 2020-09-01 MED ORDER — SUCRALFATE 1 G PO TABS: 1.0000 g | ORAL_TABLET | Freq: Four times a day (QID) | ORAL | 0 refills | Status: DC | PRN

## 2020-09-01 MED ORDER — FAMOTIDINE IN NACL 20-0.9 MG/50ML-% IV SOLN
20.0000 mg | Freq: Once | INTRAVENOUS | Status: AC
  Administered 2020-09-01: 20 mg via INTRAVENOUS
  Filled 2020-09-01: qty 50

## 2020-09-01 MED ORDER — PANTOPRAZOLE SODIUM 40 MG PO TBEC: 40.0000 mg | DELAYED_RELEASE_TABLET | Freq: Every day | ORAL | 1 refills | Status: DC

## 2020-09-01 NOTE — ED Triage Notes (Signed)
Pt brought in via EMS for epigastric pain, worse after eating that started Thursday evening.  Pt reports pain that radiates under her left breast, and states she feels like she needs to have her esophagus stretched again, as it feels "tight" with frequent burping and heartburn.  Pt also has hiatal hernia.    Pt denies SOB, denies n/v.   Currently denies any  pain.

## 2020-09-01 NOTE — Discharge Instructions (Signed)
Take the Protonix (pantoprazole) daily as prescribed.  You may take the sucralfate up to 4 times daily on top of this if needed for pain.  You may also take the Reglan (metoclopramide) as needed for nausea.  Follow-up with your primary care doctor and GI doctor.  Return to the ER for new, worsening, or persistent severe pain, vomiting, fever, weakness, or any other new or worsening symptoms that concern you.

## 2020-09-01 NOTE — ED Provider Notes (Signed)
[facility] [facility] Emergency Department Provider Note ____________________________________________   Event Date/Time   First MD Initiated Contact with Patient 09/01/20 0845     (approximate)  I have reviewed the triage vital signs and the nursing notes.   HISTORY  Chief Complaint Abdominal Pain    HPI Jackie Hunter is a 75 y.o. female with PMH as noted below including GERD, gastritis, and CKD who presents with epigastric and chest pain over the last 5 days, persistent course, radiating somewhat into her left axilla and breast but not to the back, and associated with nausea but no vomiting.  The pain is worse after she tries to eat.  She has no diarrhea or lower abdominal pain.  She denies any urinary symptoms.  She denies any prior history of similar pain although she does have a history of gastritis and takes Nexium and Carafate.  Past Medical History:  Diagnosis Date  . Adenoma of colon   . Allergic rhinitis   . Anxiety   . Arthritis   . Asthma   . Back pain   . Carpal tunnel syndrome   . Carpal tunnel syndrome   . Carpal tunnel syndrome   . Cataract cortical, senile   . Cervicalgia   . Chronic kidney disease    STAGE 3  . CKD (chronic kidney disease)   . DDD (degenerative disc disease), lumbar   . DDD (degenerative disc disease), lumbar   . Diverticulosis   . Gastritis   . GERD (gastroesophageal reflux disease)   . Headache   . Hiatal hernia   . History of hiatal hernia   . Hypertension   . Lipids serum increased   . Lumbago   . Migraines   . Obesity   . Osteoarthritis   . Stroke ([place])   . TIA (transient ischemic attack)   . Wrist fracture     Patient Active Problem List   Diagnosis Date Noted  . Pelvic pain in female 05/27/2020  . Fatty infiltration of liver 08/03/2019  . Gastritis without bleeding 08/03/2019  . Diverticulosis of large intestine without hemorrhage 08/25/2016  . Hand pain 04/18/2016  . Migraines 02/18/2016  . CKD  (chronic kidney disease) stage 3, GFR 30-59 ml/min (HCC) 08/13/2015  . Moderate episode of recurrent major depressive disorder ([place]) 08/13/2015  . DDD (degenerative disc disease), lumbar 04/02/2015  . Lumbar radiculitis 04/02/2015  . Lumbar stenosis with neurogenic claudication 04/02/2015  . Depressive disorder 11/05/2014  . Essential hypertension 11/05/2014  . Gastroesophageal reflux disease without esophagitis 11/05/2014  . Generalized osteoarthritis of multiple sites 11/05/2014  . Subcutaneous nodule 11/05/2014  . Anxiety associated with depression 06/25/2014  . Asthma without status asthmaticus 06/25/2014  . Benign essential hypertension 06/25/2014  . Other and unspecified hyperlipidemia 06/25/2014  . Frequency of micturition 11/29/2013  . Urinary urgency 11/29/2013  . Increased frequency of urination 11/29/2013  . Recurrent urinary tract infection 02/08/2013  . Urinary tract infection 02/08/2013    Past Surgical History:  Procedure Laterality Date  . BREAST BIOPSY Right 2014   NEG  . BREAST BIOPSY Left 11/24/2016   [place] core, radial scar   . BREAST EXCISIONAL BIOPSY Left 1967   NEG  . cataracts Bilateral   . COLONOSCOPY WITH PROPOFOL N/A 05/03/2016   Procedure: COLONOSCOPY WITH PROPOFOL;  Surgeon: Lollie Sails, MD;  Location: [facility] ENDOSCOPY;  Service: Endoscopy;  Laterality: N/A;  . COLONOSCOPY WITH PROPOFOL N/A 11/01/2016   Procedure: COLONOSCOPY WITH PROPOFOL;  Surgeon: Billie Ruddy  Gustavo Lah, MD;  Location: [facility] ENDOSCOPY;  Service: Endoscopy;  Laterality: N/A;  . COLONOSCOPY WITH PROPOFOL N/A 11/02/2016   Procedure: COLONOSCOPY WITH PROPOFOL;  Surgeon: Lollie Sails, MD;  Location: [facility] ENDOSCOPY;  Service: Endoscopy;  Laterality: N/A;  . COLONOSCOPY WITH PROPOFOL N/A 12/01/2017   Procedure: COLONOSCOPY WITH PROPOFOL;  Surgeon: Lollie Sails, MD;  Location: [facility] ENDOSCOPY;  Service: Endoscopy;  Laterality: N/A;  . COLONOSCOPY WITH PROPOFOL N/A 03/02/2018    Procedure: COLONOSCOPY WITH PROPOFOL;  Surgeon: Lollie Sails, MD;  Location: [facility] ENDOSCOPY;  Service: Endoscopy;  Laterality: N/A;  . ESOPHAGOGASTRODUODENOSCOPY (EGD) WITH PROPOFOL N/A 10/26/2015   Procedure: ESOPHAGOGASTRODUODENOSCOPY (EGD) WITH PROPOFOL;  Surgeon: [name], MD;  Location: [facility] ENDOSCOPY;  Service: Gastroenterology;  Laterality: N/A;  . ESOPHAGOGASTRODUODENOSCOPY (EGD) WITH PROPOFOL N/A 02/12/2019   Procedure: ESOPHAGOGASTRODUODENOSCOPY (EGD) WITH PROPOFOL;  Surgeon: Lollie Sails, MD;  Location: [facility] ENDOSCOPY;  Service: Endoscopy;  Laterality: N/A;  . EYE SURGERY    . OOPHORECTOMY     left  . OOPHORECTOMY Left     Prior to Admission medications   Medication Sig Start Date End Date Taking? Authorizing Provider  metoCLOPramide (REGLAN) 10 MG tablet Take 1 tablet (10 mg total) by mouth every 8 (eight) hours as needed for up to 10 days for nausea or vomiting. 09/01/20 09/11/20 Yes Arta Silence, MD  pantoprazole (PROTONIX) 40 MG tablet Take 1 tablet (40 mg total) by mouth daily. 09/01/20 10/31/20 Yes Arta Silence, MD  sucralfate (CARAFATE) 1 g tablet Take 1 tablet (1 g total) by mouth 4 (four) times daily as needed for up to 15 days. 09/01/20 09/16/20 Yes Arta Silence, MD  albuterol (VENTOLIN HFA) 108 (90 Base) MCG/ACT inhaler Inhale 2 puffs into the lungs every 4 (four) hours as needed for wheezing or shortness of breath.    [provider]  ALPRAZolam Duanne Moron) 0.25 MG tablet Take 0.25 mg by mouth at bedtime as needed for anxiety.    [provider]  Azelastine HCl 137 MCG/SPRAY SOLN Place 1 spray into both nostrils 2 (two) times daily. 02/14/20   [provider]  buPROPion (ZYBAN) 150 MG 12 hr tablet Take 150 mg by mouth daily.    [provider]  escitalopram (LEXAPRO) 10 MG tablet escitalopram 10 mg tablet    [provider]  esomeprazole (NEXIUM) 40 MG capsule Take by mouth.    [provider]   fexofenadine (ALLEGRA) 60 MG tablet Take 60 mg by mouth 2 (two) times daily as needed for allergies or rhinitis.    [provider]  fluticasone (FLONASE) 50 MCG/ACT nasal spray Place 2 sprays into both nostrils daily.    [provider]  gabapentin (NEURONTIN) 300 MG capsule Take 300 mg by mouth 3 (three) times daily.    [provider]  ibuprofen (ADVIL,MOTRIN) 200 MG tablet Take 200 mg by mouth every 6 (six) hours as needed.    [provider]  lisinopril-hydrochlorothiazide (PRINZIDE,ZESTORETIC) 10-12.5 MG tablet Take 1 tablet by mouth daily.    [provider]  meloxicam (MOBIC) 15 MG tablet meloxicam 15 mg tablet  TAKE 1 TABLET(S) EVERY DAY BY ORAL ROUTE WITH MEALS.    [provider]  metroNIDAZOLE (FLAGYL) 250 MG tablet metronidazole 250 mg tablet    [provider]  mometasone (NASONEX) 50 MCG/ACT nasal spray Place 2 sprays into the nose daily.    [provider]  montelukast (SINGULAIR) 10 MG tablet Take by mouth. 03/06/18 03/06/19  [provider]  Omega-3 1000 MG CAPS Take by mouth.    [provider]  omega-3 acid ethyl esters (LOVAZA) 1 g capsule Take by mouth 2 (two) times daily.    [provider]  solifenacin (VESICARE) 5 MG tablet Take by mouth. 04/29/13   [provider]  terconazole (TERAZOL 3) 80 MG vaginal suppository Place vaginally. 01/01/19   [provider]  traMADol (ULTRAM) 50 MG tablet Take by mouth every 6 (six) hours as needed.    [provider]  vitamin B-12 (CYANOCOBALAMIN) 1000 MCG tablet Take 1,000 mcg by mouth daily.    [provider]  zolpidem (AMBIEN) 5 MG tablet zolpidem 5 mg tablet    [provider]    Allergies Levaquin [levofloxacin in d5w], Meloxicam, Codeine, and Sucralfate  Family History  Problem Relation Age of Onset  . Kidney cancer Neg Hx   . Prostate cancer Neg Hx   . Breast cancer Neg Hx      Social History Social History   Tobacco Use  . Smoking status: Never Smoker  . Smokeless tobacco: Never Used  Vaping Use  . Vaping Use: Never used  Substance Use Topics  . Alcohol use: Yes    Comment: rarely  . Drug use: Never    Review of Systems  Constitutional: No fever. Eyes: No redness. ENT: No sore throat. Cardiovascular: Positive for chest pain. Respiratory: Denies shortness of breath. Gastrointestinal: Positive for nausea. Genitourinary: Negative for dysuria.  Musculoskeletal: Negative for back pain. Skin: Negative for rash. Neurological: Negative for headache.   ____________________________________________   PHYSICAL EXAM:  VITAL SIGNS: ED Triage Vitals [09/01/20 0838]  Enc Vitals Group     BP 118/71     Pulse Rate 77     Resp 18     Temp 97.9 F (36.6 C)     Temp Source Oral     SpO2 97 %     Weight 180 lb (81.6 kg)     Height 5\' 3"  (1.6 m)     Head Circumference      Peak Flow      Pain Score 0     Pain Loc      Pain Edu?      Excl. in [place]?     Constitutional: Alert and oriented.  Slightly uncomfortable but not ill-appearing; no acute distress. Eyes: Conjunctivae are normal.  No scleral icterus. Head: Atraumatic. Nose: No congestion/rhinnorhea. Mouth/Throat: Mucous membranes are somewhat dry.   Neck: Normal range of motion.  Cardiovascular: Normal rate, regular rhythm. Grossly normal heart sounds.  Good peripheral circulation. Respiratory: Normal respiratory effort.  No retractions. Lungs CTAB. Gastrointestinal: Soft with mild epigastric tenderness.  No distention.  Genitourinary: No flank tenderness. Musculoskeletal: Extremities warm and well perfused.  Neurologic:  Normal speech and language. No gross focal neurologic deficits are appreciated.  Skin:  Skin is warm and dry. No rash noted. Psychiatric: Mood and affect are normal. Speech and behavior are normal.  ____________________________________________   LABS (all labs ordered  are listed, but only abnormal results are displayed)  Labs Reviewed  BASIC METABOLIC PANEL - Abnormal; Notable for the following components:      Result Value   Glucose, Bld 133 (*)    Creatinine, Ser 1.32 (*)    GFR, Estimated 42 (*)    All other components within normal limits  CBC WITH DIFFERENTIAL/PLATELET - Abnormal; Notable for the following components:   Platelets 405 (*)    All other  components within normal limits  URINALYSIS, COMPLETE (UACMP) WITH MICROSCOPIC - Abnormal; Notable for the following components:   Color, Urine YELLOW (*)    APPearance HAZY (*)    Hgb urine dipstick SMALL (*)    Bacteria, UA RARE (*)    All other components within normal limits  FIBRIN DERIVATIVES D-DIMER ([facility] ONLY) - Abnormal; Notable for the following components:   Fibrin derivatives D-dimer [facility]) 618.25 (*)    All other components within normal limits  HEPATIC FUNCTION PANEL  LIPASE, BLOOD  TROPONIN I (HIGH SENSITIVITY)   ____________________________________________  EKG  ED ECG REPORT I, Arta Silence, the attending physician, personally viewed and interpreted this ECG.  Date: 09/01/2020 EKG Time: 0832 Rate: 68 Rhythm: normal sinus rhythm QRS Axis: normal Intervals: normal ST/T Wave abnormalities: normal Narrative Interpretation: no evidence of acute ischemia  ____________________________________________  RADIOLOGY  CT angio chest: No PE or other acute abdomen CT abdomen: Diverticulosis without acute diverticulitis.  No acute abnormality. ____________________________________________   PROCEDURES  Procedure(s) performed: No  Procedures  Critical Care performed: No ____________________________________________   INITIAL IMPRESSION / ASSESSMENT AND PLAN / ED COURSE  Pertinent labs & imaging results that were available during my care of the patient were reviewed by me and considered in my medical decision making (see chart for details).  75 year old female  with PMH as noted above presents with epigastric pain radiating up towards the left breast and chest wall over the last several days, associated with nausea and worse after eating.  I reviewed the past medical records in [place].  The patient has no prior ED visits or admissions here.  She had a routine outpatient PMD office visit on 2/8 and had no acute abdominal chest pain at that time.  She last had an EGD in July 2020 which showed gastritis but no other acute findings.  On exam the patient is overall relatively well-appearing.  Her vital signs are normal.  She has mild epigastric tenderness.  The remainder of the exam is unremarkable.  Differential includes gastritis, GERD, gastroparesis, pancreatitis or other hepatobiliary cause.  I have a lower suspicion for acute cardiac cause although this could be an atypical presentation of ACS or less likely a PE.  We will obtain a lab work-up including hepatobiliary labs, D-dimer and troponin, give fluids, Pepcid, and Reglan, and reassess.  The patient may require imaging based on her symptoms or the results of the lab work-up.  ----------------------------------------- 3:08 PM on 09/01/2020 -----------------------------------------  Lab work-up is largely reassuring.  Hepatobiliary labs are within normal limits.  The patient has no leukocytosis.  Troponin is negative.  There is no indication for repeat given the duration of the symptoms.  My clinical suspicion for ACS is extremely low.  The patient's EKG is normal.  D-dimer slightly elevated.  I proceeded with a CT angio of the chest as well as a CT abdomen.  There is no evidence of PE, and the CTs otherwise do not reveal any acute findings.  On reassessment, the patient reports improved symptoms after the Pepcid and Reglan.  She appears comfortable.  I counseled her on the results of the work-up.  At this time, she is stable for discharge home.  She was on Protonix previously but states that she has been  off of it for some time.  She has follow-up with her GI doctor next month.  I will restart her on Protonix and give her sucralfate as well as Reglan for symptomatic treatment.  She agrees  with this plan.  Return precautions given, and she expresses understanding.  ____________________________________________   FINAL CLINICAL IMPRESSION(S) / ED DIAGNOSES  Final diagnoses:  Gastritis without bleeding, unspecified chronicity, unspecified gastritis type  Epigastric pain      NEW MEDICATIONS STARTED DURING THIS VISIT:  New Prescriptions   METOCLOPRAMIDE (REGLAN) 10 MG TABLET    Take 1 tablet (10 mg total) by mouth every 8 (eight) hours as needed for up to 10 days for nausea or vomiting.   PANTOPRAZOLE (PROTONIX) 40 MG TABLET    Take 1 tablet (40 mg total) by mouth daily.   SUCRALFATE (CARAFATE) 1 G TABLET    Take 1 tablet (1 g total) by mouth 4 (four) times daily as needed for up to 15 days.     Note:  This document was prepared using Dragon voice recognition software and may include unintentional dictation errors.    Arta Silence, MD 09/01/20 1510

## 2020-09-01 NOTE — ED Notes (Signed)
Patient transported to [phone]

## 2020-12-26 ENCOUNTER — Inpatient Hospital Stay
Admission: EM | Admit: 2020-12-26 | Discharge: 2020-12-31 | DRG: 378 | Disposition: A | Discharge status: Home Health | Payer: 59 | Attending: Internal Medicine | Admitting: Internal Medicine

## 2020-12-26 ENCOUNTER — Emergency Department: Payer: 59

## 2020-12-26 ENCOUNTER — Encounter: Payer: Self-pay | Admitting: Emergency Medicine

## 2020-12-26 ENCOUNTER — Other Ambulatory Visit: Payer: Self-pay

## 2020-12-26 DIAGNOSIS — R42 Dizziness and giddiness: Secondary | ICD-10-CM

## 2020-12-26 DIAGNOSIS — I129 Hypertensive chronic kidney disease with stage 1 through stage 4 chronic kidney disease, or unspecified chronic kidney disease: Secondary | ICD-10-CM | POA: Diagnosis present

## 2020-12-26 DIAGNOSIS — E871 Hypo-osmolality and hyponatremia: Secondary | ICD-10-CM | POA: Diagnosis present

## 2020-12-26 DIAGNOSIS — Z79899 Other long term (current) drug therapy: Secondary | ICD-10-CM

## 2020-12-26 DIAGNOSIS — E1142 Type 2 diabetes mellitus with diabetic polyneuropathy: Secondary | ICD-10-CM | POA: Diagnosis present

## 2020-12-26 DIAGNOSIS — E119 Type 2 diabetes mellitus without complications: Secondary | ICD-10-CM

## 2020-12-26 DIAGNOSIS — Z20822 Contact with and (suspected) exposure to covid-19: Secondary | ICD-10-CM | POA: Diagnosis present

## 2020-12-26 DIAGNOSIS — E538 Deficiency of other specified B group vitamins: Secondary | ICD-10-CM

## 2020-12-26 DIAGNOSIS — N182 Chronic kidney disease, stage 2 (mild): Secondary | ICD-10-CM

## 2020-12-26 DIAGNOSIS — M1990 Unspecified osteoarthritis, unspecified site: Secondary | ICD-10-CM | POA: Diagnosis present

## 2020-12-26 DIAGNOSIS — R531 Weakness: Secondary | ICD-10-CM

## 2020-12-26 DIAGNOSIS — Z881 Allergy status to other antibiotic agents status: Secondary | ICD-10-CM

## 2020-12-26 DIAGNOSIS — D62 Acute posthemorrhagic anemia: Secondary | ICD-10-CM

## 2020-12-26 DIAGNOSIS — K760 Fatty (change of) liver, not elsewhere classified: Secondary | ICD-10-CM | POA: Diagnosis present

## 2020-12-26 DIAGNOSIS — Z791 Long term (current) use of non-steroidal anti-inflammatories (NSAID): Secondary | ICD-10-CM

## 2020-12-26 DIAGNOSIS — K625 Hemorrhage of anus and rectum: Secondary | ICD-10-CM | POA: Diagnosis present

## 2020-12-26 DIAGNOSIS — I773 Arterial fibromuscular dysplasia: Secondary | ICD-10-CM | POA: Diagnosis present

## 2020-12-26 DIAGNOSIS — E860 Dehydration: Secondary | ICD-10-CM | POA: Diagnosis present

## 2020-12-26 DIAGNOSIS — K5731 Diverticulosis of large intestine without perforation or abscess with bleeding: Secondary | ICD-10-CM | POA: Diagnosis not present

## 2020-12-26 DIAGNOSIS — K219 Gastro-esophageal reflux disease without esophagitis: Secondary | ICD-10-CM | POA: Diagnosis present

## 2020-12-26 DIAGNOSIS — F32A Depression, unspecified: Secondary | ICD-10-CM | POA: Diagnosis present

## 2020-12-26 DIAGNOSIS — R7301 Impaired fasting glucose: Secondary | ICD-10-CM

## 2020-12-26 DIAGNOSIS — Z8673 Personal history of transient ischemic attack (TIA), and cerebral infarction without residual deficits: Secondary | ICD-10-CM

## 2020-12-26 DIAGNOSIS — K5790 Diverticulosis of intestine, part unspecified, without perforation or abscess without bleeding: Secondary | ICD-10-CM

## 2020-12-26 DIAGNOSIS — E1165 Type 2 diabetes mellitus with hyperglycemia: Secondary | ICD-10-CM | POA: Diagnosis present

## 2020-12-26 DIAGNOSIS — E1122 Type 2 diabetes mellitus with diabetic chronic kidney disease: Secondary | ICD-10-CM | POA: Diagnosis present

## 2020-12-26 DIAGNOSIS — Z888 Allergy status to other drugs, medicaments and biological substances status: Secondary | ICD-10-CM

## 2020-12-26 DIAGNOSIS — K922 Gastrointestinal hemorrhage, unspecified: Secondary | ICD-10-CM | POA: Diagnosis present

## 2020-12-26 DIAGNOSIS — K2971 Gastritis, unspecified, with bleeding: Secondary | ICD-10-CM

## 2020-12-26 DIAGNOSIS — F419 Anxiety disorder, unspecified: Secondary | ICD-10-CM

## 2020-12-26 DIAGNOSIS — F410 Panic disorder [episodic paroxysmal anxiety] without agoraphobia: Secondary | ICD-10-CM | POA: Diagnosis present

## 2020-12-26 DIAGNOSIS — Z885 Allergy status to narcotic agent status: Secondary | ICD-10-CM

## 2020-12-26 DIAGNOSIS — E861 Hypovolemia: Secondary | ICD-10-CM | POA: Diagnosis present

## 2020-12-26 LAB — CBC
HCT: 38 % (ref 36.0–46.0)
Hemoglobin: 13 g/dL (ref 12.0–15.0)
MCH: 30.3 pg (ref 26.0–34.0)
MCHC: 34.2 g/dL (ref 30.0–36.0)
MCV: 88.6 fL (ref 80.0–100.0)
Platelets: 381 10*3/uL (ref 150–400)
RBC: 4.29 MIL/uL (ref 3.87–5.11)
RDW: 14 % (ref 11.5–15.5)
WBC: 7.3 10*3/uL (ref 4.0–10.5)
nRBC: 0 % (ref 0.0–0.2)

## 2020-12-26 LAB — COMPREHENSIVE METABOLIC PANEL
ALT: 18 U/L (ref 0–44)
AST: 27 U/L (ref 15–41)
Albumin: 4.1 g/dL (ref 3.5–5.0)
Alkaline Phosphatase: 56 U/L (ref 38–126)
Anion gap: 12 (ref 5–15)
BUN: 28 mg/dL — ABNORMAL HIGH (ref 8–23)
CO2: 22 mmol/L (ref 22–32)
Calcium: 9 mg/dL (ref 8.9–10.3)
Chloride: 99 mmol/L (ref 98–111)
Creatinine, Ser: 1.18 mg/dL — ABNORMAL HIGH (ref 0.44–1.00)
GFR, Estimated: 48 mL/min — ABNORMAL LOW (ref >60)
Glucose, Bld: 160 mg/dL — ABNORMAL HIGH (ref 70–99)
Potassium: 3.5 mmol/L (ref 3.5–5.1)
Sodium: 133 mmol/L — ABNORMAL LOW (ref 135–145)
Total Bilirubin: 0.8 mg/dL (ref 0.3–1.2)
Total Protein: 7.7 g/dL (ref 6.5–8.1)

## 2020-12-26 LAB — HEMOGLOBIN AND HEMATOCRIT, BLOOD
HCT: 38.5 % (ref 36.0–46.0)
HCT: 35.9 % — ABNORMAL LOW (ref 36.0–46.0)
Hemoglobin: 12.9 g/dL (ref 12.0–15.0)
Hemoglobin: 12.2 g/dL (ref 12.0–15.0)

## 2020-12-26 LAB — RESP PANEL BY RT-PCR (FLU A&B, COVID) ARPGX2
Influenza A by PCR: NEGATIVE
Influenza B by PCR: NEGATIVE
SARS Coronavirus 2 by RT PCR: NEGATIVE

## 2020-12-26 LAB — TROPONIN I (HIGH SENSITIVITY): Troponin I (High Sensitivity): 16 ng/L (ref <18)

## 2020-12-26 LAB — GLUCOSE, CAPILLARY
Glucose-Capillary: 94 mg/dL (ref 70–99)
Glucose-Capillary: 173 mg/dL — ABNORMAL HIGH (ref 70–99)

## 2020-12-26 MED ORDER — INSULIN ASPART 100 UNIT/ML IJ SOLN
0.0000 [IU] | Freq: Three times a day (TID) | INTRAMUSCULAR | Status: DC
  Administered 2020-12-27 – 2020-12-29 (×2): 1 [IU] via SUBCUTANEOUS
  Administered 2020-12-31: 3 [IU] via SUBCUTANEOUS
  Filled 2020-12-26 (×3): qty 1

## 2020-12-26 MED ORDER — ESCITALOPRAM OXALATE 10 MG PO TABS
10.0000 mg | ORAL_TABLET | Freq: Every day | ORAL | Status: DC
  Administered 2020-12-27 – 2020-12-31 (×4): 10 mg via ORAL
  Filled 2020-12-26 (×5): qty 1

## 2020-12-26 MED ORDER — INSULIN ASPART 100 UNIT/ML IJ SOLN: 0.0000 [IU] | Freq: Every day | INTRAMUSCULAR | Status: DC

## 2020-12-26 MED ORDER — BUPROPION HCL ER (XL) 150 MG PO TB24
150.0000 mg | ORAL_TABLET | Freq: Every day | ORAL | Status: DC
  Administered 2020-12-27 – 2020-12-31 (×4): 150 mg via ORAL
  Filled 2020-12-26 (×4): qty 1

## 2020-12-26 MED ORDER — VITAMIN B-12 1000 MCG PO TABS
1000.0000 ug | ORAL_TABLET | Freq: Every day | ORAL | Status: DC
  Administered 2020-12-27 – 2020-12-31 (×4): 1000 ug via ORAL
  Filled 2020-12-26 (×4): qty 1

## 2020-12-26 MED ORDER — PANTOPRAZOLE SODIUM 40 MG PO TBEC: 40.0000 mg | DELAYED_RELEASE_TABLET | Freq: Every day | ORAL | Status: DC

## 2020-12-26 MED ORDER — ALPRAZOLAM 0.25 MG PO TABS
0.2500 mg | ORAL_TABLET | Freq: Every evening | ORAL | Status: DC | PRN
  Administered 2020-12-31: 02:00:00 0.25 mg via ORAL
  Filled 2020-12-26 (×2): qty 1

## 2020-12-26 MED ORDER — GABAPENTIN 300 MG PO CAPS
300.0000 mg | ORAL_CAPSULE | Freq: Three times a day (TID) | ORAL | Status: DC
  Administered 2020-12-26 – 2020-12-31 (×12): 300 mg via ORAL
  Filled 2020-12-26 (×12): qty 1

## 2020-12-26 MED ORDER — PANTOPRAZOLE SODIUM 40 MG IV SOLR
40.0000 mg | Freq: Once | INTRAVENOUS | Status: AC
  Administered 2020-12-26: 40 mg via INTRAVENOUS
  Filled 2020-12-26: qty 40

## 2020-12-26 MED ORDER — ACETAMINOPHEN 325 MG PO TABS
650.0000 mg | ORAL_TABLET | Freq: Four times a day (QID) | ORAL | Status: DC | PRN
  Administered 2020-12-27 – 2020-12-31 (×3): 650 mg via ORAL
  Filled 2020-12-26 (×3): qty 2

## 2020-12-26 MED ORDER — ONDANSETRON HCL 4 MG/2ML IJ SOLN: 4.0000 mg | Freq: Four times a day (QID) | INTRAMUSCULAR | Status: DC | PRN

## 2020-12-26 MED ORDER — IOHEXOL 350 MG/ML SOLN
75.0000 mL | Freq: Once | INTRAVENOUS | Status: AC | PRN
  Administered 2020-12-26: 75 mL via INTRAVENOUS

## 2020-12-26 MED ORDER — MELATONIN 5 MG PO TABS
2.5000 mg | ORAL_TABLET | Freq: Every evening | ORAL | Status: DC | PRN
  Administered 2020-12-27 – 2020-12-29 (×2): 2.5 mg via ORAL
  Filled 2020-12-26 (×3): qty 0.5

## 2020-12-26 MED ORDER — SODIUM CHLORIDE 0.9 % IV SOLN: INTRAVENOUS | Status: AC

## 2020-12-26 NOTE — ED Provider Notes (Addendum)
[facility] Emergency Department Provider Note  ____________________________________________   Event Date/Time   First MD Initiated Contact with Patient 12/26/20 1212     (approximate)  I have reviewed the triage vital signs and the nursing notes.   HISTORY  Chief Complaint Dizziness and Rectal Bleeding    HPI Jackie Hunter is a 75 y.o. female with CKD, hiatal hernia, gastritis who comes in for rectal bleeding and dizziness.  Patient concerned about bright red blood in the stool for the past 2 days.  Patient does report having to strain to pass the stool.  Patient states that is been with every bowel movement, intermittent, nothing makes it better, nothing makes it worse.  She does report intermittent use of Advil but does not use it daily.  Denies any other blood thinners.  Denies ever having this previously.  Denies ever needing a blood transfusion.  She does report some associated shortness of breath and dizziness that started yesterday as well.  She states that she feels like she is going to fall when she walks.  She states that she lives alone does not have any family that live by her.  On review of records patient did have a CT scan showing diverticulosis.          Past Medical History:  Diagnosis Date   Adenoma of colon    Allergic rhinitis    Anxiety    Arthritis    Asthma    Back pain    Carpal tunnel syndrome    Carpal tunnel syndrome    Carpal tunnel syndrome    Cataract cortical, senile    Cervicalgia    Chronic kidney disease    STAGE 3   CKD (chronic kidney disease)    DDD (degenerative disc disease), lumbar    DDD (degenerative disc disease), lumbar    Diverticulosis    Gastritis    GERD (gastroesophageal reflux disease)    Headache    Hiatal hernia    History of hiatal hernia    Hypertension    Lipids serum increased    Lumbago    Migraines    Obesity    Osteoarthritis    Stroke [facility])    TIA (transient ischemic  attack)    Wrist fracture     Patient Active Problem List   Diagnosis Date Noted   Pelvic pain in female 05/27/2020   Fatty infiltration of liver 08/03/2019   Gastritis without bleeding 08/03/2019   Diverticulosis of large intestine without hemorrhage 08/25/2016   Hand pain 04/18/2016   Migraines 02/18/2016   CKD (chronic kidney disease) stage 3, GFR 30-59 ml/min (HCC) 08/13/2015   Moderate episode of recurrent major depressive disorder ([place]) 08/13/2015   DDD (degenerative disc disease), lumbar 04/02/2015   Lumbar radiculitis 04/02/2015   Lumbar stenosis with neurogenic claudication 04/02/2015   Depressive disorder 11/05/2014   Essential hypertension 11/05/2014   Gastroesophageal reflux disease without esophagitis 11/05/2014   Generalized osteoarthritis of multiple sites 11/05/2014   Subcutaneous nodule 11/05/2014   Anxiety associated with depression 06/25/2014   Asthma without status asthmaticus 06/25/2014   Benign essential hypertension 06/25/2014   Other and unspecified hyperlipidemia 06/25/2014   Frequency of micturition 11/29/2013   Urinary urgency 11/29/2013   Increased frequency of urination 11/29/2013   Recurrent urinary tract infection 02/08/2013   Urinary tract infection 02/08/2013    Past Surgical History:  Procedure Laterality Date   BREAST BIOPSY Right 2014   NEG   BREAST  BIOPSY Left 11/24/2016   [place] core, radial scar    BREAST EXCISIONAL BIOPSY Left 1967   NEG   cataracts Bilateral    COLONOSCOPY WITH PROPOFOL N/A 05/03/2016   Procedure: COLONOSCOPY WITH PROPOFOL;  Surgeon: Lollie Sails, MD;  Location: [facility] ENDOSCOPY;  Service: Endoscopy;  Laterality: N/A;   COLONOSCOPY WITH PROPOFOL N/A 11/01/2016   Procedure: COLONOSCOPY WITH PROPOFOL;  Surgeon: Lollie Sails, MD;  Location: [facility] ENDOSCOPY;  Service: Endoscopy;  Laterality: N/A;   COLONOSCOPY WITH PROPOFOL N/A 11/02/2016   Procedure: COLONOSCOPY WITH PROPOFOL;  Surgeon: Lollie Sails, MD;   Location: [facility] ENDOSCOPY;  Service: Endoscopy;  Laterality: N/A;   COLONOSCOPY WITH PROPOFOL N/A 12/01/2017   Procedure: COLONOSCOPY WITH PROPOFOL;  Surgeon: Lollie Sails, MD;  Location: [facility] ENDOSCOPY;  Service: Endoscopy;  Laterality: N/A;   COLONOSCOPY WITH PROPOFOL N/A 03/02/2018   Procedure: COLONOSCOPY WITH PROPOFOL;  Surgeon: Lollie Sails, MD;  Location: [facility] ENDOSCOPY;  Service: Endoscopy;  Laterality: N/A;   ESOPHAGOGASTRODUODENOSCOPY (EGD) WITH PROPOFOL N/A 10/26/2015   Procedure: ESOPHAGOGASTRODUODENOSCOPY (EGD) WITH PROPOFOL;  Surgeon: [name], MD;  Location: [facility] ENDOSCOPY;  Service: Gastroenterology;  Laterality: N/A;   ESOPHAGOGASTRODUODENOSCOPY (EGD) WITH PROPOFOL N/A 02/12/2019   Procedure: ESOPHAGOGASTRODUODENOSCOPY (EGD) WITH PROPOFOL;  Surgeon: Lollie Sails, MD;  Location: [facility] ENDOSCOPY;  Service: Endoscopy;  Laterality: N/A;   EYE SURGERY     OOPHORECTOMY     left   OOPHORECTOMY Left     Prior to Admission medications   Medication Sig Start Date End Date Taking? Authorizing Provider  albuterol (VENTOLIN HFA) 108 (90 Base) MCG/ACT inhaler Inhale 2 puffs into the lungs every 4 (four) hours as needed for wheezing or shortness of breath.    [provider]  ALPRAZolam Duanne Moron) 0.25 MG tablet Take 0.25 mg by mouth at bedtime as needed for anxiety.    [provider]  Azelastine HCl 137 MCG/SPRAY SOLN Place 1 spray into both nostrils 2 (two) times daily. 02/14/20   [provider]  buPROPion (ZYBAN) 150 MG 12 hr tablet Take 150 mg by mouth daily.    [provider]  escitalopram (LEXAPRO) 10 MG tablet escitalopram 10 mg tablet    [provider]  esomeprazole (NEXIUM) 40 MG capsule Take by mouth.    [provider]  fexofenadine (ALLEGRA) 60 MG tablet Take 60 mg by mouth 2 (two) times daily as needed for allergies or rhinitis.    [provider]  fluticasone (FLONASE) 50 MCG/ACT nasal spray Place 2  sprays into both nostrils daily.    [provider]  gabapentin (NEURONTIN) 300 MG capsule Take 300 mg by mouth 3 (three) times daily.    [provider]  ibuprofen (ADVIL,MOTRIN) 200 MG tablet Take 200 mg by mouth every 6 (six) hours as needed.    [provider]  lisinopril-hydrochlorothiazide (PRINZIDE,ZESTORETIC) 10-12.5 MG tablet Take 1 tablet by mouth daily.    [provider]  meloxicam (MOBIC) 15 MG tablet meloxicam 15 mg tablet  TAKE 1 TABLET(S) EVERY DAY BY ORAL ROUTE WITH MEALS.    [provider]  metoCLOPramide (REGLAN) 10 MG tablet Take 1 tablet (10 mg total) by mouth every 8 (eight) hours as needed for up to 10 days for nausea or vomiting. 09/01/20 09/11/20  Arta Silence, MD  metroNIDAZOLE (FLAGYL) 250 MG tablet metronidazole 250 mg tablet    [provider]  mometasone (NASONEX) 50 MCG/ACT nasal spray Place 2 sprays into the nose  daily.    [provider]  montelukast (SINGULAIR) 10 MG tablet Take by mouth. 03/06/18 03/06/19  [provider]  Omega-3 1000 MG CAPS Take by mouth.    [provider]  omega-3 acid ethyl esters (LOVAZA) 1 g capsule Take by mouth 2 (two) times daily.    [provider]  pantoprazole (PROTONIX) 40 MG tablet Take 1 tablet (40 mg total) by mouth daily. 09/01/20 10/31/20  Arta Silence, MD  solifenacin (VESICARE) 5 MG tablet Take by mouth. 04/29/13   [provider]  sucralfate (CARAFATE) 1 g tablet Take 1 tablet (1 g total) by mouth 4 (four) times daily as needed for up to 15 days. 09/01/20 09/16/20  Arta Silence, MD  terconazole (TERAZOL 3) 80 MG vaginal suppository Place vaginally. 01/01/19   [provider]  traMADol (ULTRAM) 50 MG tablet Take by mouth every 6 (six) hours as needed.    [provider]  vitamin B-12 (CYANOCOBALAMIN) 1000 MCG tablet Take 1,000 mcg by mouth daily.    [provider]  zolpidem (AMBIEN) 5 MG  tablet zolpidem 5 mg tablet    [provider]    Allergies Levaquin [levofloxacin in d5w], Meloxicam, Codeine, and Sucralfate  Family History  Problem Relation Age of Onset   Kidney cancer Neg Hx    Prostate cancer Neg Hx    Breast cancer Neg Hx     Social History Social History   Tobacco Use   Smoking status: Never   Smokeless tobacco: Never  Vaping Use   Vaping Use: Never used  Substance Use Topics   Alcohol use: Yes    Comment: rarely   Drug use: Never      Review of Systems Constitutional: No fever/chills, dizzy Eyes: No visual changes. ENT: No sore throat. Cardiovascular: Denies chest pain. Respiratory: Shortness of breath Gastrointestinal: No abdominal pain.  No nausea, no vomiting.  No diarrhea.  No constipation.  Rectal bleeding Genitourinary: Negative for dysuria. Musculoskeletal: Negative for back pain. Skin: Negative for rash. Neurological: Negative for headaches, focal weakness or numbness. All other ROS negative ____________________________________________   PHYSICAL EXAM:  VITAL SIGNS: ED Triage Vitals  Enc Vitals Group     BP 12/26/20 0944 109/65     Pulse Rate 12/26/20 0943 95     Resp 12/26/20 0943 16     Temp 12/26/20 0943 98.1 F (36.7 C)     Temp Source 12/26/20 0943 Oral     SpO2 12/26/20 0943 98 %     Weight 12/26/20 0944 185 lb (83.9 kg)     Height 12/26/20 0944 5\' 3"  (1.6 m)     Head Circumference --      Peak Flow --      Pain Score 12/26/20 0944 0     Pain Loc --      Pain Edu? --      Excl. in [place]? --     Constitutional: Alert and oriented. Well appearing and in no acute distress. Eyes: Conjunctivae are normal. EOMI. Head: Atraumatic. Nose: No congestion/rhinnorhea. Mouth/Throat: Mucous membranes are moist.   Neck: No stridor. Trachea Midline. FROM Cardiovascular: Normal rate, regular rhythm. Grossly normal heart sounds.  Good peripheral circulation. Respiratory: Normal respiratory effort.  No retractions.  Lungs CTAB. Gastrointestinal: Soft and nontender. No distention. No abdominal bruits.  Musculoskeletal: No lower extremity tenderness nor edema.  No joint effusions. Neurologic:  Normal speech and language. No gross focal neurologic deficits are appreciated.  Skin:  Skin is  warm, dry and intact. No rash noted. Psychiatric: Mood and affect are normal. Speech and behavior are normal. GU: Positive gross blood  ____________________________________________   LABS (all labs ordered are listed, but only abnormal results are displayed)  Labs Reviewed  COMPREHENSIVE METABOLIC PANEL - Abnormal; Notable for the following components:      Result Value   Sodium 133 (*)    Glucose, Bld 160 (*)    BUN 28 (*)    Creatinine, Ser 1.18 (*)    GFR, Estimated 48 (*)    All other components within normal limits  CBC  POC OCCULT BLOOD, ED  TYPE AND SCREEN   ____________________________________________   ED ECG REPORT I, Vanessa Foster, the attending physician, personally viewed and interpreted this ECG.  Sinus tachycardia rate of 101, no ST elevation, no T wave versions, normal intervals ____________________________________________  RADIOLOGY Robert Bellow, personally viewed and evaluated these images (plain radiographs) as part of my medical decision making, as well as reviewing the written report by the radiologist.  ED MD interpretation: No pneumonia  Official radiology report(s): DG Chest 2 View  Result Date: 12/26/2020 CLINICAL DATA:  Shortness of breath EXAM: CHEST - 2 VIEW COMPARISON:  February 12, 2017, September 01, 2020 FINDINGS: The cardiomediastinal silhouette is unchanged in contour.Atherosclerotic calcifications. No pleural effusion. No pneumothorax. No acute pleuroparenchymal abnormality. Visualized abdomen is unremarkable. Multilevel degenerative changes of the thoracic spine. IMPRESSION: No acute cardiopulmonary abnormality. Electronically Signed   By: Valentino Saxon MD   On:  12/26/2020 13:53   CT Angio Abd/Pel W and/or Wo Contrast  Result Date: 12/26/2020 CLINICAL DATA:  75 year old female with rectal bleeding EXAM: CTA ABDOMEN AND PELVIS WITHOUT AND WITH CONTRAST TECHNIQUE: Multidetector CT imaging of the abdomen and pelvis was performed using the standard protocol during bolus administration of intravenous contrast. Multiplanar reconstructed images and MIPs were obtained and reviewed to evaluate the vascular anatomy. CONTRAST:  20mL OMNIPAQUE IOHEXOL 350 MG/ML SOLN COMPARISON:  CT abdomen/pelvis 09/01/2020 FINDINGS: VASCULAR Aorta: Normal caliber aorta without aneurysm, dissection, vasculitis or significant stenosis. Scattered atherosclerotic vascular plaque. Celiac: Patent without evidence of aneurysm, dissection or significant stenosis gas occlusion. Variant anatomy. The celiac axis proper gives rise to the splenic and common hepatic artery. The right inferior phrenic and left gastric artery have a separate origin from the aorta. The lateral segmental branch of the left hepatic artery is replaced to the left gastric artery. SMA: Patent without evidence of aneurysm, dissection, vasculitis or significant stenosis. Renals: Solitary renal arteries bilaterally. The origins are widely patent. Beading in the mid aspect of the right renal artery most consistent with changes of fibromuscular dysplasia. No definite changes within the left renal artery. IMA: Patent without evidence of aneurysm, dissection, vasculitis or significant stenosis. Inflow: Patent without evidence of aneurysm, dissection, vasculitis or significant stenosis. Proximal Outflow: Bilateral common femoral and visualized portions of the superficial and profunda femoral arteries are patent without evidence of aneurysm, dissection, vasculitis or significant stenosis. Veins: No focal venous abnormality. Review of the MIP images confirms the above findings. NON-VASCULAR Lower chest: No acute abnormality. Hepatobiliary:  Geographic hypoattenuation in the left hemi-liver adjacent to the fissure for the falciform ligament is nonspecific but most suggestive of benign focal fatty infiltration. Additionally, the hepatic parenchyma overall is low in attenuation with relative sparing adjacent to the gallbladder fossa consistent with a lesser degree of diffuse focal fatty infiltration. Small benign cyst versus hamartoma in the posterior aspect of the right hepatic dome. Otherwise,  no discrete lesion. Gallbladder is unremarkable. No intra or extrahepatic biliary ductal dilatation. Pancreas: Unremarkable. No pancreatic ductal dilatation or surrounding inflammatory changes. Spleen: Normal in size without focal abnormality. Adrenals/Urinary Tract: Normal adrenal glands. No hydronephrosis, nephrolithiasis or enhancing renal aspirated innumerable bilateral low-attenuation renal lesions almost all of which are too small for accurate characterization. Statistically, these are highly likely benign cysts. The ureters and bladder are unremarkable. Stomach/Bowel: Advanced pan colonic diverticulosis. No evidence of active inflammation to suggest diverticulitis. No evidence of contrast extravasation to suggest active arterial bleeding. Normal appendix in the right lower quadrant. No evidence of bowel obstruction. Unremarkable stomach and small bowel. Lymphatic: No suspicious lymphadenopathy. Reproductive: Uterus and bilateral adnexa are unremarkable. Other: No evidence of ascites. Small fat containing umbilical hernia. Musculoskeletal: No acute osseous abnormality. Multilevel degenerative disc disease. IMPRESSION: 1. Negative for evidence of active GI bleed at this time. 2. Advanced pan colonic diverticulosis. No evidence of active diverticulitis. 3. Hepatic steatosis. 4. Right mid renal artery fibromuscular dysplasia. No evidence of dissection or aneurysm. 5.  Aortic Atherosclerosis (ICD10-170.0) 6. Numerous small low-attenuation lesions scattered  throughout both kidneys which are too small for accurate characterization. Statistically, these are highly likely benign cysts. 7. Small fat containing umbilical hernia. Electronically Signed   By: Jacqulynn Cadet M.D.   On: 12/26/2020 13:52    ____________________________________________   PROCEDURES  Procedure(s) performed (including Critical Care):  .1-3 Lead EKG Interpretation  Date/Time: 12/26/2020 12:52 PM Performed by: Vanessa [place], MD Authorized by: Vanessa [place], MD     Interpretation: normal     ECG rate:  80s   ECG rate assessment: normal     Rhythm: sinus rhythm     Ectopy: none     Conduction: normal     ____________________________________________   INITIAL IMPRESSION / ASSESSMENT AND PLAN / ED COURSE  Jackie Hunter was evaluated in Emergency Department on 12/26/2020 for the symptoms described in the history of present illness. She was evaluated in the context of the global COVID-19 pandemic, which necessitated consideration that the patient might be at risk for infection with the SARS-CoV-2 virus that causes COVID-19. Institutional protocols and algorithms that pertain to the evaluation of patients at risk for COVID-19 are in a state of rapid change based on information released by regulatory bodies including the CDC and federal and state organizations. These policies and algorithms were followed during the patient's care in the ED.    Patient is a 75 year old who comes in with dizziness and rectal bleeding.  Could be secondary to hemorrhoids, fissure, diverticulosis.  Does report some intermittent NSAID use but seems less likely ulcer.  On exam patient did have gross blood.  Hemoglobin is stable at this time.  CT scan without any evidence of active bleeding.  Patient's [place] score for safe discharge was 89%.  Discussed with patient she states that she lives alone she states that she feels dizzy and felt more comfortable coming into the hospital and being  monitored  Suspect this is secondary to diverticulosis however       ____________________________________________   FINAL CLINICAL IMPRESSION(S) / ED DIAGNOSES   Final diagnoses:  Rectal bleeding  Dizziness      MEDICATIONS GIVEN DURING THIS VISIT:  Medications  pantoprazole (PROTONIX) injection 40 mg (has no administration in time range)  iohexol (OMNIPAQUE) 350 MG/ML injection 75 mL (75 mLs Intravenous Contrast Given 12/26/20 1319)     ED Discharge Orders     None  Note:  This document was prepared using Dragon voice recognition software and may include unintentional dictation errors.    Vanessa [place], MD 12/26/20 1416    Vanessa [place], MD 12/26/20 [phone]

## 2020-12-26 NOTE — H&P (Addendum)
History and Physical  [name] MEB:[id number] DOB: [birth year] DOA: 12/26/2020  Referring physician: Dr. Jari Pigg, [place]. PCP: Tracie Harrier, MD  Outpatient Specialists: GI. Patient coming from: Home.    Chief Complaint: Bright red blood per rectum x2 days and dizziness.  HPI: [facility] is a 75 y.o. female with medical history significant for chronic constipation, diverticulitis in 2019, post diverticulitis follow-up colonoscopy done on 03/02/2018 by Dr. Gustavo Lah revealed extensive diverticulosis involving sigmoid colon, descending colon, transverse colon, ascending colon, mid ascending colon, and nonbleeding internal hemorrhoids, gastritis diagnosed on 02/12/2019 post EGD by Dr. Gustavo Lah, GERD, chronic anxiety/depression, polyneuropathy, essential hypertension, who presented to [facility] with complaints of 3 days of bright red blood per rectum associated with dizziness, global weakness and poor oral intake.  No abdominal pain.  No constitutional symptoms.  Denies prior history of GI bleed.  She presented to the [facility] for further evaluation.  While in the [facility] she had a rectal exam that showed gross blood per EDP.  CTA abdomen/pelvis with and without contrast showed no evidence of active bleeding however revealed extensive diverticulosis.  Hemoglobin stable at 13.  [facility], hospitalist team, asked to admit for observation.  [facility] Course:  Temperature 98.1.  BP 111/28, pulse 76, respiration rate 14, O2 saturation 95% on room air.  Lab studies remarkable for serum sodium 133, potassium 3.5, glucose 1 BUN 28, creatinine 1.18, anion gap 12, serum bicarb 22.  GFR 48.  Troponin -16.  WBC 7.3, hemoglobin 13.0, MCV 88, platelet 381.  Chest x-ray nonacute.  Review of Systems: Review of systems as noted in the HPI. All other systems reviewed and are negative.   Past Medical History:  Diagnosis Date   Adenoma of colon    Allergic rhinitis    Anxiety    Arthritis    Asthma    Back pain    Carpal tunnel  syndrome    Carpal tunnel syndrome    Carpal tunnel syndrome    Cataract cortical, senile    Cervicalgia    Chronic kidney disease    STAGE 3   CKD (chronic kidney disease)    DDD (degenerative disc disease), lumbar    DDD (degenerative disc disease), lumbar    Diverticulosis    Gastritis    GERD (gastroesophageal reflux disease)    Headache    Hiatal hernia    History of hiatal hernia    Hypertension    Lipids serum increased    Lumbago    Migraines    Obesity    Osteoarthritis    Stroke [facility])    TIA (transient ischemic attack)    Wrist fracture    Past Surgical History:  Procedure Laterality Date   BREAST BIOPSY Right 2014   NEG   BREAST BIOPSY Left 11/24/2016   [place] core, radial scar    BREAST EXCISIONAL BIOPSY Left 1967   NEG   cataracts Bilateral    COLONOSCOPY WITH PROPOFOL N/A 05/03/2016   Procedure: COLONOSCOPY WITH PROPOFOL;  Surgeon: Lollie Sails, MD;  Location: [facility] ENDOSCOPY;  Service: Endoscopy;  Laterality: N/A;   COLONOSCOPY WITH PROPOFOL N/A 11/01/2016   Procedure: COLONOSCOPY WITH PROPOFOL;  Surgeon: Lollie Sails, MD;  Location: [facility] ENDOSCOPY;  Service: Endoscopy;  Laterality: N/A;   COLONOSCOPY WITH PROPOFOL N/A 11/02/2016   Procedure: COLONOSCOPY WITH PROPOFOL;  Surgeon: Lollie Sails, MD;  Location: [facility] ENDOSCOPY;  Service: Endoscopy;  Laterality: N/A;   COLONOSCOPY WITH PROPOFOL N/A 12/01/2017   Procedure: COLONOSCOPY  WITH PROPOFOL;  Surgeon: Lollie Sails, MD;  Location: [facility] ENDOSCOPY;  Service: Endoscopy;  Laterality: N/A;   COLONOSCOPY WITH PROPOFOL N/A 03/02/2018   Procedure: COLONOSCOPY WITH PROPOFOL;  Surgeon: Lollie Sails, MD;  Location: [facility] ENDOSCOPY;  Service: Endoscopy;  Laterality: N/A;   ESOPHAGOGASTRODUODENOSCOPY (EGD) WITH PROPOFOL N/A 10/26/2015   Procedure: ESOPHAGOGASTRODUODENOSCOPY (EGD) WITH PROPOFOL;  Surgeon: [name], MD;  Location: [facility] ENDOSCOPY;  Service: Gastroenterology;  Laterality: N/A;    ESOPHAGOGASTRODUODENOSCOPY (EGD) WITH PROPOFOL N/A 02/12/2019   Procedure: ESOPHAGOGASTRODUODENOSCOPY (EGD) WITH PROPOFOL;  Surgeon: Lollie Sails, MD;  Location: [facility] ENDOSCOPY;  Service: Endoscopy;  Laterality: N/A;   EYE SURGERY     OOPHORECTOMY     left   OOPHORECTOMY Left     Social History:  reports that she has never smoked. She has never used smokeless tobacco. She reports current alcohol use. She reports that she does not use drugs.   Allergies  Allergen Reactions   Levaquin [Levofloxacin In D5w] Other (See Comments)    dizziness   Meloxicam    Codeine Anxiety   Sucralfate Nausea Only    Family History  Problem Relation Age of Onset   Kidney cancer Neg Hx    Prostate cancer Neg Hx    Breast cancer Neg Hx       Prior to Admission medications   Medication Sig Start Date End Date Taking? Authorizing Provider  albuterol (VENTOLIN HFA) 108 (90 Base) MCG/ACT inhaler Inhale 2 puffs into the lungs every 4 (four) hours as needed for wheezing or shortness of breath.    [provider]  ALPRAZolam Duanne Moron) 0.25 MG tablet Take 0.25 mg by mouth at bedtime as needed for anxiety.    [provider]  Azelastine HCl 137 MCG/SPRAY SOLN Place 1 spray into both nostrils 2 (two) times daily. 02/14/20   [provider]  buPROPion (ZYBAN) 150 MG 12 hr tablet Take 150 mg by mouth daily.    [provider]  escitalopram (LEXAPRO) 10 MG tablet escitalopram 10 mg tablet    [provider]  esomeprazole (NEXIUM) 40 MG capsule Take by mouth.    [provider]  fexofenadine (ALLEGRA) 60 MG tablet Take 60 mg by mouth 2 (two) times daily as needed for allergies or rhinitis.    [provider]  fluticasone (FLONASE) 50 MCG/ACT nasal spray Place 2 sprays into both nostrils daily.    [provider]  gabapentin (NEURONTIN) 300 MG capsule Take 300 mg by mouth 3 (three) times daily.    [provider]  ibuprofen  (ADVIL,MOTRIN) 200 MG tablet Take 200 mg by mouth every 6 (six) hours as needed.    [provider]  lisinopril-hydrochlorothiazide (PRINZIDE,ZESTORETIC) 10-12.5 MG tablet Take 1 tablet by mouth daily.    [provider]  meloxicam (MOBIC) 15 MG tablet meloxicam 15 mg tablet  TAKE 1 TABLET(S) EVERY DAY BY ORAL ROUTE WITH MEALS.    [provider]  metoCLOPramide (REGLAN) 10 MG tablet Take 1 tablet (10 mg total) by mouth every 8 (eight) hours as needed for up to 10 days for nausea or vomiting. 09/01/20 09/11/20  Arta Silence, MD  metroNIDAZOLE (FLAGYL) 250 MG tablet metronidazole 250 mg tablet    [provider]  mometasone (NASONEX) 50 MCG/ACT nasal spray Place 2 sprays into the nose daily.    [provider]  montelukast (SINGULAIR) 10 MG tablet Take by mouth. 03/06/18 03/06/19  [provider]  Omega-3 1000 MG  CAPS Take by mouth.    [provider]  omega-3 acid ethyl esters (LOVAZA) 1 g capsule Take by mouth 2 (two) times daily.    [provider]  pantoprazole (PROTONIX) 40 MG tablet Take 1 tablet (40 mg total) by mouth daily. 09/01/20 10/31/20  Arta Silence, MD  solifenacin (VESICARE) 5 MG tablet Take by mouth. 04/29/13   [provider]  sucralfate (CARAFATE) 1 g tablet Take 1 tablet (1 g total) by mouth 4 (four) times daily as needed for up to 15 days. 09/01/20 09/16/20  Arta Silence, MD  terconazole (TERAZOL 3) 80 MG vaginal suppository Place vaginally. 01/01/19   [provider]  traMADol (ULTRAM) 50 MG tablet Take by mouth every 6 (six) hours as needed.    [provider]  vitamin B-12 (CYANOCOBALAMIN) 1000 MCG tablet Take 1,000 mcg by mouth daily.    [provider]  zolpidem (AMBIEN) 5 MG tablet zolpidem 5 mg tablet    [provider]    Physical Exam: BP (!) 111/48   Pulse 70   Temp 98.1 F (36.7 C) (Oral)   Resp 14   Ht 5\' 3"  (1.6 m)   Wt 83.9 kg    SpO2 99%   BMI 32.77 kg/m   General: 75 y.o. year-old female well developed well nourished in no acute distress.  Alert and oriented x3. Cardiovascular: Regular rate and rhythm with no rubs or gallops.  No thyromegaly or JVD noted.  No lower extremity edema. 2/4 pulses in all 4 extremities. Respiratory: Clear to auscultation with no wheezes or rales. Good inspiratory effort. Abdomen: Soft nontender nondistended with normal bowel sounds x4 quadrants. Muskuloskeletal: No cyanosis, clubbing or edema noted bilaterally Neuro: CN II-XII intact, strength, sensation, reflexes Skin: No ulcerative lesions noted or rashes Psychiatry: Judgement and insight appear normal. Mood is appropriate for condition and setting          Labs on Admission:  Basic Metabolic Panel: Recent Labs  Lab 12/26/20 0952  NA 133*  K 3.5  CL 99  CO2 22  GLUCOSE 160*  BUN 28*  CREATININE 1.18*  CALCIUM 9.0   Liver Function Tests: Recent Labs  Lab 12/26/20 0952  AST 27  ALT 18  ALKPHOS 56  BILITOT 0.8  PROT 7.7  ALBUMIN 4.1   No results for input(s): LIPASE, AMYLASE in the last 168 hours. No results for input(s): AMMONIA in the last 168 hours. CBC: Recent Labs  Lab 12/26/20 0952  WBC 7.3  HGB 13.0  HCT 38.0  MCV 88.6  PLT 381   Cardiac Enzymes: No results for input(s): CKTOTAL, CKMB, CKMBINDEX, TROPONINI in the last 168 hours.  BNP (last 3 results) No results for input(s): BNP in the last 8760 hours.  ProBNP (last 3 results) No results for input(s): PROBNP in the last 8760 hours.  CBG: No results for input(s): GLUCAP in the last 168 hours.  Radiological Exams on Admission: DG Chest 2 View  Result Date: 12/26/2020 CLINICAL DATA:  Shortness of breath EXAM: CHEST - 2 VIEW COMPARISON:  February 12, 2017, September 01, 2020 FINDINGS: The cardiomediastinal silhouette is unchanged in contour.Atherosclerotic calcifications. No pleural effusion. No pneumothorax. No acute pleuroparenchymal abnormality.  Visualized abdomen is unremarkable. Multilevel degenerative changes of the thoracic spine. IMPRESSION: No acute cardiopulmonary abnormality. Electronically Signed   By: Valentino Saxon MD   On: 12/26/2020 13:53   CT Angio Abd/Pel W and/or Wo Contrast  Result Date: 12/26/2020 CLINICAL DATA:  75 year old female with  rectal bleeding EXAM: CTA ABDOMEN AND PELVIS WITHOUT AND WITH CONTRAST TECHNIQUE: Multidetector CT imaging of the abdomen and pelvis was performed using the standard protocol during bolus administration of intravenous contrast. Multiplanar reconstructed images and MIPs were obtained and reviewed to evaluate the vascular anatomy. CONTRAST:  47mL OMNIPAQUE IOHEXOL 350 MG/ML SOLN COMPARISON:  CT abdomen/pelvis 09/01/2020 FINDINGS: VASCULAR Aorta: Normal caliber aorta without aneurysm, dissection, vasculitis or significant stenosis. Scattered atherosclerotic vascular plaque. Celiac: Patent without evidence of aneurysm, dissection or significant stenosis gas occlusion. Variant anatomy. The celiac axis proper gives rise to the splenic and common hepatic artery. The right inferior phrenic and left gastric artery have a separate origin from the aorta. The lateral segmental branch of the left hepatic artery is replaced to the left gastric artery. SMA: Patent without evidence of aneurysm, dissection, vasculitis or significant stenosis. Renals: Solitary renal arteries bilaterally. The origins are widely patent. Beading in the mid aspect of the right renal artery most consistent with changes of fibromuscular dysplasia. No definite changes within the left renal artery. IMA: Patent without evidence of aneurysm, dissection, vasculitis or significant stenosis. Inflow: Patent without evidence of aneurysm, dissection, vasculitis or significant stenosis. Proximal Outflow: Bilateral common femoral and visualized portions of the superficial and profunda femoral arteries are patent without evidence of aneurysm,  dissection, vasculitis or significant stenosis. Veins: No focal venous abnormality. Review of the MIP images confirms the above findings. NON-VASCULAR Lower chest: No acute abnormality. Hepatobiliary: Geographic hypoattenuation in the left hemi-liver adjacent to the fissure for the falciform ligament is nonspecific but most suggestive of benign focal fatty infiltration. Additionally, the hepatic parenchyma overall is low in attenuation with relative sparing adjacent to the gallbladder fossa consistent with a lesser degree of diffuse focal fatty infiltration. Small benign cyst versus hamartoma in the posterior aspect of the right hepatic dome. Otherwise, no discrete lesion. Gallbladder is unremarkable. No intra or extrahepatic biliary ductal dilatation. Pancreas: Unremarkable. No pancreatic ductal dilatation or surrounding inflammatory changes. Spleen: Normal in size without focal abnormality. Adrenals/Urinary Tract: Normal adrenal glands. No hydronephrosis, nephrolithiasis or enhancing renal aspirated innumerable bilateral low-attenuation renal lesions almost all of which are too small for accurate characterization. Statistically, these are highly likely benign cysts. The ureters and bladder are unremarkable. Stomach/Bowel: Advanced pan colonic diverticulosis. No evidence of active inflammation to suggest diverticulitis. No evidence of contrast extravasation to suggest active arterial bleeding. Normal appendix in the right lower quadrant. No evidence of bowel obstruction. Unremarkable stomach and small bowel. Lymphatic: No suspicious lymphadenopathy. Reproductive: Uterus and bilateral adnexa are unremarkable. Other: No evidence of ascites. Small fat containing umbilical hernia. Musculoskeletal: No acute osseous abnormality. Multilevel degenerative disc disease. IMPRESSION: 1. Negative for evidence of active GI bleed at this time. 2. Advanced pan colonic diverticulosis. No evidence of active diverticulitis. 3.  Hepatic steatosis. 4. Right mid renal artery fibromuscular dysplasia. No evidence of dissection or aneurysm. 5.  Aortic Atherosclerosis (ICD10-170.0) 6. Numerous small low-attenuation lesions scattered throughout both kidneys which are too small for accurate characterization. Statistically, these are highly likely benign cysts. 7. Small fat containing umbilical hernia. Electronically Signed   By: Jacqulynn Cadet M.D.   On: 12/26/2020 13:52    EKG: I independently viewed the EKG done and my findings are as followed: Sinus tachycardia rate of 101, nonspecific ST-T changes.  QTc 474.  Assessment/Plan Present on Admission:  GI bleed  Active Problems:   GI bleed  Acute lower GI bleed, suspected diverticular bleed. Presented with bright red blood per rectum  x3 days. Gross blood on rectal exam in the [facility] per EDP. History of extensive diverticulosis seen on colonoscopy in 2019 CTA abdomen and pelvis with and without contrast revealed extensive diverticulosis with no active bleeding. Hemoglobin stable at 13 Follow serial H&H every 6 hours. Start gentle IV fluid hydration, maintain MAP greater than 65. Start clear liquid diet, n.p.o. after midnight tomorrow GI consult.  Dizziness, global weakness, suspect secondary to acute blood loss Obtain orthostatic vital signs. Fall precautions. PT OT assessment once hemodynamically stable, starting on 12/27/2020. IV fluid hydration  Hyperglycemia, not on oral hypoglycemics at home. Presented with serum glucose of 160 Obtain hemoglobin A1c Start insulin sliding scale. Start gentle IV fluid hydration  Hypovolemic hyponatremia Presented with serum sodium of 133 Start gentle IV fluid hydration normal saline at 50 cc/h x 1 day.  History of gastritis/GERD Resume home regimen.  CKD 3B Appears to be at her baseline creatinine 1.1 with GFR 48 Avoid nephrotoxic agents, dehydration and hypotension. Monitor urine output Repeat renal panel in the  morning.  Polyneuropathy Resume home regimen.  Chronic anxiety and depression Resume home regimen.   DVT prophylaxis: SCDs.  Code Status: Full code.  Family Communication: None at bedside.  Updated her daughter Janett Billow via phone.  Please provide daily updates to daughter Janett Billow.  Disposition Plan: Admit to MedSurg unit with remote telemetry.  Consults called: GI.  Admission status: Observation status.   Status is: Observation    Dispo:  Patient From: Home  Planned Disposition: Home possibly on 12/27/2020 or when GI signs off.  Medically stable for discharge: No      Kayleen Memos MD [facility] Pager [phone]  If 7PM-7AM, please contact night-coverage [email or web address] Password [facility]  12/26/2020, 2:44 PM

## 2020-12-26 NOTE — Plan of Care (Signed)
New admission

## 2020-12-26 NOTE — ED Notes (Signed)
Patient ready for transport.

## 2020-12-26 NOTE — ED Triage Notes (Signed)
Pt comes into the ED via EMS from home with c/o bright red blood in her stool for the past 2 days, states she has been constipated and has been straining to pass her stool. Pt is in NAD.

## 2020-12-26 NOTE — ED Triage Notes (Signed)
Pt states that she has been having having rectal bleeding for 2 days and that yesterday she started having dizziness and shortness of breath when she is ambulating.

## 2020-12-27 DIAGNOSIS — R531 Weakness: Secondary | ICD-10-CM

## 2020-12-27 DIAGNOSIS — K625 Hemorrhage of anus and rectum: Secondary | ICD-10-CM | POA: Diagnosis present

## 2020-12-27 DIAGNOSIS — F32A Depression, unspecified: Secondary | ICD-10-CM | POA: Diagnosis present

## 2020-12-27 DIAGNOSIS — F410 Panic disorder [episodic paroxysmal anxiety] without agoraphobia: Secondary | ICD-10-CM | POA: Diagnosis present

## 2020-12-27 DIAGNOSIS — Z881 Allergy status to other antibiotic agents status: Secondary | ICD-10-CM | POA: Diagnosis not present

## 2020-12-27 DIAGNOSIS — E861 Hypovolemia: Secondary | ICD-10-CM | POA: Diagnosis present

## 2020-12-27 DIAGNOSIS — E1142 Type 2 diabetes mellitus with diabetic polyneuropathy: Secondary | ICD-10-CM | POA: Diagnosis present

## 2020-12-27 DIAGNOSIS — Z791 Long term (current) use of non-steroidal anti-inflammatories (NSAID): Secondary | ICD-10-CM | POA: Diagnosis not present

## 2020-12-27 DIAGNOSIS — K922 Gastrointestinal hemorrhage, unspecified: Secondary | ICD-10-CM | POA: Diagnosis not present

## 2020-12-27 DIAGNOSIS — Z885 Allergy status to narcotic agent status: Secondary | ICD-10-CM | POA: Diagnosis not present

## 2020-12-27 DIAGNOSIS — Z20822 Contact with and (suspected) exposure to covid-19: Secondary | ICD-10-CM | POA: Diagnosis present

## 2020-12-27 DIAGNOSIS — Z79899 Other long term (current) drug therapy: Secondary | ICD-10-CM | POA: Diagnosis not present

## 2020-12-27 DIAGNOSIS — N182 Chronic kidney disease, stage 2 (mild): Secondary | ICD-10-CM | POA: Diagnosis present

## 2020-12-27 DIAGNOSIS — F419 Anxiety disorder, unspecified: Secondary | ICD-10-CM

## 2020-12-27 DIAGNOSIS — E1122 Type 2 diabetes mellitus with diabetic chronic kidney disease: Secondary | ICD-10-CM | POA: Diagnosis present

## 2020-12-27 DIAGNOSIS — K219 Gastro-esophageal reflux disease without esophagitis: Secondary | ICD-10-CM | POA: Diagnosis present

## 2020-12-27 DIAGNOSIS — I773 Arterial fibromuscular dysplasia: Secondary | ICD-10-CM | POA: Diagnosis present

## 2020-12-27 DIAGNOSIS — D62 Acute posthemorrhagic anemia: Secondary | ICD-10-CM | POA: Diagnosis present

## 2020-12-27 DIAGNOSIS — E871 Hypo-osmolality and hyponatremia: Secondary | ICD-10-CM | POA: Diagnosis present

## 2020-12-27 DIAGNOSIS — E860 Dehydration: Secondary | ICD-10-CM | POA: Diagnosis present

## 2020-12-27 DIAGNOSIS — E538 Deficiency of other specified B group vitamins: Secondary | ICD-10-CM | POA: Diagnosis present

## 2020-12-27 DIAGNOSIS — R7301 Impaired fasting glucose: Secondary | ICD-10-CM

## 2020-12-27 DIAGNOSIS — Z8673 Personal history of transient ischemic attack (TIA), and cerebral infarction without residual deficits: Secondary | ICD-10-CM | POA: Diagnosis not present

## 2020-12-27 DIAGNOSIS — I129 Hypertensive chronic kidney disease with stage 1 through stage 4 chronic kidney disease, or unspecified chronic kidney disease: Secondary | ICD-10-CM | POA: Diagnosis present

## 2020-12-27 DIAGNOSIS — R42 Dizziness and giddiness: Secondary | ICD-10-CM | POA: Diagnosis present

## 2020-12-27 DIAGNOSIS — K5790 Diverticulosis of intestine, part unspecified, without perforation or abscess without bleeding: Secondary | ICD-10-CM | POA: Diagnosis not present

## 2020-12-27 DIAGNOSIS — E1165 Type 2 diabetes mellitus with hyperglycemia: Secondary | ICD-10-CM | POA: Diagnosis present

## 2020-12-27 DIAGNOSIS — I10 Essential (primary) hypertension: Secondary | ICD-10-CM

## 2020-12-27 DIAGNOSIS — M1990 Unspecified osteoarthritis, unspecified site: Secondary | ICD-10-CM | POA: Diagnosis present

## 2020-12-27 DIAGNOSIS — K5731 Diverticulosis of large intestine without perforation or abscess with bleeding: Secondary | ICD-10-CM | POA: Diagnosis present

## 2020-12-27 DIAGNOSIS — K760 Fatty (change of) liver, not elsewhere classified: Secondary | ICD-10-CM | POA: Diagnosis present

## 2020-12-27 DIAGNOSIS — N1831 Chronic kidney disease, stage 3a: Secondary | ICD-10-CM

## 2020-12-27 DIAGNOSIS — Z888 Allergy status to other drugs, medicaments and biological substances status: Secondary | ICD-10-CM | POA: Diagnosis not present

## 2020-12-27 LAB — GLUCOSE, CAPILLARY
Glucose-Capillary: 111 mg/dL — ABNORMAL HIGH (ref 70–99)
Glucose-Capillary: 127 mg/dL — ABNORMAL HIGH (ref 70–99)
Glucose-Capillary: 120 mg/dL — ABNORMAL HIGH (ref 70–99)
Glucose-Capillary: 113 mg/dL — ABNORMAL HIGH (ref 70–99)

## 2020-12-27 LAB — COMPREHENSIVE METABOLIC PANEL
ALT: 16 U/L (ref 0–44)
AST: 24 U/L (ref 15–41)
Albumin: 3.5 g/dL (ref 3.5–5.0)
Alkaline Phosphatase: 47 U/L (ref 38–126)
Anion gap: 6 (ref 5–15)
BUN: 23 mg/dL (ref 8–23)
CO2: 27 mmol/L (ref 22–32)
Calcium: 8.6 mg/dL — ABNORMAL LOW (ref 8.9–10.3)
Chloride: 101 mmol/L (ref 98–111)
Creatinine, Ser: 1.01 mg/dL — ABNORMAL HIGH (ref 0.44–1.00)
GFR, Estimated: 57 mL/min — ABNORMAL LOW (ref >60)
Glucose, Bld: 140 mg/dL — ABNORMAL HIGH (ref 70–99)
Potassium: 4 mmol/L (ref 3.5–5.1)
Sodium: 134 mmol/L — ABNORMAL LOW (ref 135–145)
Total Bilirubin: 0.7 mg/dL (ref 0.3–1.2)
Total Protein: 6.3 g/dL — ABNORMAL LOW (ref 6.5–8.1)

## 2020-12-27 LAB — HEMOGLOBIN AND HEMATOCRIT, BLOOD
HCT: 32.1 % — ABNORMAL LOW (ref 36.0–46.0)
Hemoglobin: 11 g/dL — ABNORMAL LOW (ref 12.0–15.0)

## 2020-12-27 LAB — HEMOGLOBIN: Hemoglobin: 10.2 g/dL — ABNORMAL LOW (ref 12.0–15.0)

## 2020-12-27 LAB — CBC
HCT: 32.7 % — ABNORMAL LOW (ref 36.0–46.0)
Hemoglobin: 10.9 g/dL — ABNORMAL LOW (ref 12.0–15.0)
MCH: 30 pg (ref 26.0–34.0)
MCHC: 33.3 g/dL (ref 30.0–36.0)
MCV: 90.1 fL (ref 80.0–100.0)
Platelets: 345 10*3/uL (ref 150–400)
RBC: 3.63 MIL/uL — ABNORMAL LOW (ref 3.87–5.11)
RDW: 14.2 % (ref 11.5–15.5)
WBC: 8.7 10*3/uL (ref 4.0–10.5)
nRBC: 0 % (ref 0.0–0.2)

## 2020-12-27 LAB — TROPONIN I (HIGH SENSITIVITY)
Troponin I (High Sensitivity): 7 ng/L (ref <18)
Troponin I (High Sensitivity): 8 ng/L (ref <18)
Troponin I (High Sensitivity): 7 ng/L (ref <18)

## 2020-12-27 LAB — MAGNESIUM: Magnesium: 2.2 mg/dL (ref 1.7–2.4)

## 2020-12-27 LAB — PHOSPHORUS: Phosphorus: 3.8 mg/dL (ref 2.5–4.6)

## 2020-12-27 MED ORDER — ALPRAZOLAM 0.5 MG PO TABS
0.5000 mg | ORAL_TABLET | Freq: Once | ORAL | Status: AC
  Administered 2020-12-27: 17:00:00 0.5 mg via ORAL
  Filled 2020-12-27: qty 1

## 2020-12-27 MED ORDER — ALUM & MAG HYDROXIDE-SIMETH 200-200-20 MG/5ML PO SUSP
30.0000 mL | Freq: Four times a day (QID) | ORAL | Status: DC | PRN
  Administered 2020-12-27: 17:00:00 30 mL via ORAL
  Filled 2020-12-27: qty 30

## 2020-12-27 MED ORDER — METOPROLOL SUCCINATE ER 25 MG PO TB24
12.5000 mg | ORAL_TABLET | Freq: Every day | ORAL | Status: DC
  Administered 2020-12-27 – 2020-12-31 (×4): 12.5 mg via ORAL
  Filled 2020-12-27 (×4): qty 1

## 2020-12-27 MED ORDER — PANTOPRAZOLE SODIUM 40 MG IV SOLR
40.0000 mg | Freq: Two times a day (BID) | INTRAVENOUS | Status: DC
  Administered 2020-12-27 – 2020-12-31 (×8): 40 mg via INTRAVENOUS
  Filled 2020-12-27 (×8): qty 40

## 2020-12-27 MED ORDER — PEG 3350-KCL-NA BICARB-NACL 420 G PO SOLR
4000.0000 mL | Freq: Once | ORAL | Status: AC
  Administered 2020-12-27: 4000 mL via ORAL
  Filled 2020-12-27: qty 4000

## 2020-12-27 MED ORDER — SODIUM CHLORIDE 0.9 % IV SOLN: INTRAVENOUS | Status: DC

## 2020-12-27 NOTE — Progress Notes (Signed)
Patient ID: Jackie Hunter, female   DOB: July 29, 1945, 75 y.o.   MRN: [id number] [facility] PROGRESS NOTE  Jackie Hunter NGE:[id number] DOB: August 28, 1945 DOA: 12/26/2020 PCP: Tracie Harrier, MD  HPI/Subjective: Patient stated that she has had some bleeding since Friday.  She thought she ate a lot of cherries and that was it at first but she still had some bowel movements.  She states the blood may have been a maroonish color.  Had a little epigastric discomfort.  No nausea or vomiting.  Admitted with GI bleed.  Objective: Vitals:   12/27/20 0925 12/27/20 1134  BP: (!) 141/69 (!) 110/58  Pulse:  70  Resp:  18  Temp:  97.6 F (36.4 C)  SpO2:  100%    Intake/Output Summary (Last 24 hours) at 12/27/2020 1239 Last data filed at 12/27/2020 1033 Gross per 24 hour  Intake 700 ml  Output --  Net 700 ml   Filed Weights   12/26/20 0944  Weight: 83.9 kg    ROS: Review of Systems  Respiratory:  Negative for cough and shortness of breath.   Cardiovascular:  Negative for chest pain.  Gastrointestinal:  Negative for abdominal pain, nausea and vomiting.  Exam: Physical Exam HENT:     Head: Normocephalic.     Mouth/Throat:     Pharynx: No oropharyngeal exudate.  Eyes:     General: Lids are normal.     Conjunctiva/sclera: Conjunctivae normal.  Cardiovascular:     Rate and Rhythm: Normal rate and regular rhythm.     Heart sounds: Normal heart sounds, S1 normal and S2 normal.  Pulmonary:     Breath sounds: Normal breath sounds. No decreased breath sounds, wheezing, rhonchi or rales.  Abdominal:     Palpations: Abdomen is soft.     Tenderness: There is no abdominal tenderness.  Musculoskeletal:     Right ankle: No swelling.     Left ankle: No swelling.  Skin:    General: Skin is warm.     Findings: No rash.  Neurological:     Mental Status: She is alert and oriented to person, place, and time.     Data Reviewed: Basic Metabolic Panel: Recent Labs  Lab 12/26/20 0952  12/27/20 0226  NA 133* 134*  K 3.5 4.0  CL 99 101  CO2 22 27  GLUCOSE 160* 140*  BUN 28* 23  CREATININE 1.18* 1.01*  CALCIUM 9.0 8.6*  MG  --  2.2  PHOS  --  3.8   Liver Function Tests: Recent Labs  Lab 12/26/20 0952 12/27/20 0226  AST 27 24  ALT 18 16  ALKPHOS 56 47  BILITOT 0.8 0.7  PROT 7.7 6.3*  ALBUMIN 4.1 3.5    CBC: Recent Labs  Lab 12/26/20 0952 12/26/20 1257 12/26/20 2015 12/27/20 0226 12/27/20 0822  WBC 7.3  --   --  8.7  --   HGB 13.0 12.9 12.2 10.9* 11.0*  HCT 38.0 38.5 35.9* 32.7* 32.1*  MCV 88.6  --   --  90.1  --   PLT 381  --   --  345  --      CBG: Recent Labs  Lab 12/26/20 1714 12/26/20 2036 12/27/20 0917 12/27/20 1132  GLUCAP 94 173* 111* 127*    Recent Results (from the past 240 hour(s))  Resp Panel by RT-PCR (Flu A&B, Covid) Nasopharyngeal Swab     Status: None   Collection Time: 12/26/20 12:57 PM   Specimen: Nasopharyngeal Swab; Nasopharyngeal(NP) swabs  in vial transport medium  Result Value Ref Range Status   SARS Coronavirus 2 by RT PCR NEGATIVE NEGATIVE Final    Comment: (NOTE) SARS-CoV-2 target nucleic acids are NOT DETECTED.  The SARS-CoV-2 RNA is generally detectable in upper respiratory specimens during the acute phase of infection. The lowest concentration of SARS-CoV-2 viral copies this assay can detect is 138 copies/mL. A negative result does not preclude SARS-Cov-2 infection and should not be used as the sole basis for treatment or other patient management decisions. A negative result may occur with  improper specimen collection/handling, submission of specimen other than nasopharyngeal swab, presence of viral mutation(s) within the areas targeted by this assay, and inadequate number of viral copies(<138 copies/mL). A negative result must be combined with clinical observations, patient history, and epidemiological information. The expected result is Negative.  Fact Sheet for Patients:   EntrepreneurPulse.com.au  Fact Sheet for Healthcare Providers:  IncredibleEmployment.be  This test is no t yet approved or cleared by the [place] FDA and  has been authorized for detection and/or diagnosis of SARS-CoV-2 by FDA under an Emergency Use Authorization (EUA). This EUA will remain  in effect (meaning this test can be used) for the duration of the COVID-19 declaration under Section 564(b)(1) of the Act, 21 U.S.C.section 360bbb-3(b)(1), unless the authorization is terminated  or revoked sooner.       Influenza A by PCR NEGATIVE NEGATIVE Final   Influenza B by PCR NEGATIVE NEGATIVE Final    Comment: (NOTE) The Xpert Xpress SARS-CoV-2/FLU/RSV plus assay is intended as an aid in the diagnosis of influenza from Nasopharyngeal swab specimens and should not be used as a sole basis for treatment. Nasal washings and aspirates are unacceptable for Xpert Xpress SARS-CoV-2/FLU/RSV testing.  Fact Sheet for Patients: EntrepreneurPulse.com.au  Fact Sheet for Healthcare Providers: IncredibleEmployment.be  This test is not yet approved or cleared by the [place] FDA and has been authorized for detection and/or diagnosis of SARS-CoV-2 by FDA under an Emergency Use Authorization (EUA). This EUA will remain in effect (meaning this test can be used) for the duration of the COVID-19 declaration under Section 564(b)(1) of the Act, 21 U.S.C. section 360bbb-3(b)(1), unless the authorization is terminated or revoked.  Performed at [facility], [address] [place], [place] [address]      Studies: DG Chest 2 View  Result Date: 12/26/2020 CLINICAL DATA:  Shortness of breath EXAM: CHEST - 2 VIEW COMPARISON:  February 12, 2017, September 01, 2020 FINDINGS: The cardiomediastinal silhouette is unchanged in contour.Atherosclerotic calcifications. No pleural effusion. No pneumothorax. No acute  pleuroparenchymal abnormality. Visualized abdomen is unremarkable. Multilevel degenerative changes of the thoracic spine. IMPRESSION: No acute cardiopulmonary abnormality. Electronically Signed   By: Valentino Saxon MD   On: 12/26/2020 13:53   CT Angio Abd/Pel W and/or Wo Contrast  Result Date: 12/26/2020 CLINICAL DATA:  75 year old female with rectal bleeding EXAM: CTA ABDOMEN AND PELVIS WITHOUT AND WITH CONTRAST TECHNIQUE: Multidetector CT imaging of the abdomen and pelvis was performed using the standard protocol during bolus administration of intravenous contrast. Multiplanar reconstructed images and MIPs were obtained and reviewed to evaluate the vascular anatomy. CONTRAST:  3mL OMNIPAQUE IOHEXOL 350 MG/ML SOLN COMPARISON:  CT abdomen/pelvis 09/01/2020 FINDINGS: VASCULAR Aorta: Normal caliber aorta without aneurysm, dissection, vasculitis or significant stenosis. Scattered atherosclerotic vascular plaque. Celiac: Patent without evidence of aneurysm, dissection or significant stenosis gas occlusion. Variant anatomy. The celiac axis proper gives rise to the splenic and common hepatic artery. The right inferior  phrenic and left gastric artery have a separate origin from the aorta. The lateral segmental branch of the left hepatic artery is replaced to the left gastric artery. SMA: Patent without evidence of aneurysm, dissection, vasculitis or significant stenosis. Renals: Solitary renal arteries bilaterally. The origins are widely patent. Beading in the mid aspect of the right renal artery most consistent with changes of fibromuscular dysplasia. No definite changes within the left renal artery. IMA: Patent without evidence of aneurysm, dissection, vasculitis or significant stenosis. Inflow: Patent without evidence of aneurysm, dissection, vasculitis or significant stenosis. Proximal Outflow: Bilateral common femoral and visualized portions of the superficial and profunda femoral arteries are patent without  evidence of aneurysm, dissection, vasculitis or significant stenosis. Veins: No focal venous abnormality. Review of the MIP images confirms the above findings. NON-VASCULAR Lower chest: No acute abnormality. Hepatobiliary: Geographic hypoattenuation in the left hemi-liver adjacent to the fissure for the falciform ligament is nonspecific but most suggestive of benign focal fatty infiltration. Additionally, the hepatic parenchyma overall is low in attenuation with relative sparing adjacent to the gallbladder fossa consistent with a lesser degree of diffuse focal fatty infiltration. Small benign cyst versus hamartoma in the posterior aspect of the right hepatic dome. Otherwise, no discrete lesion. Gallbladder is unremarkable. No intra or extrahepatic biliary ductal dilatation. Pancreas: Unremarkable. No pancreatic ductal dilatation or surrounding inflammatory changes. Spleen: Normal in size without focal abnormality. Adrenals/Urinary Tract: Normal adrenal glands. No hydronephrosis, nephrolithiasis or enhancing renal aspirated innumerable bilateral low-attenuation renal lesions almost all of which are too small for accurate characterization. Statistically, these are highly likely benign cysts. The ureters and bladder are unremarkable. Stomach/Bowel: Advanced pan colonic diverticulosis. No evidence of active inflammation to suggest diverticulitis. No evidence of contrast extravasation to suggest active arterial bleeding. Normal appendix in the right lower quadrant. No evidence of bowel obstruction. Unremarkable stomach and small bowel. Lymphatic: No suspicious lymphadenopathy. Reproductive: Uterus and bilateral adnexa are unremarkable. Other: No evidence of ascites. Small fat containing umbilical hernia. Musculoskeletal: No acute osseous abnormality. Multilevel degenerative disc disease. IMPRESSION: 1. Negative for evidence of active GI bleed at this time. 2. Advanced pan colonic diverticulosis. No evidence of active  diverticulitis. 3. Hepatic steatosis. 4. Right mid renal artery fibromuscular dysplasia. No evidence of dissection or aneurysm. 5.  Aortic Atherosclerosis (ICD10-170.0) 6. Numerous small low-attenuation lesions scattered throughout both kidneys which are too small for accurate characterization. Statistically, these are highly likely benign cysts. 7. Small fat containing umbilical hernia. Electronically Signed   By: Jacqulynn Cadet M.D.   On: 12/26/2020 13:52    Scheduled Meds:  buPROPion  150 mg Oral Daily   escitalopram  10 mg Oral Daily   gabapentin  300 mg Oral TID   insulin aspart  0-5 Units Subcutaneous QHS   insulin aspart  0-9 Units Subcutaneous TID WC   pantoprazole (PROTONIX) IV  40 mg Intravenous Q12H   polyethylene glycol-electrolytes  4,000 mL Oral Once   vitamin B-12  1,000 mcg Oral Daily   Continuous Infusions:  sodium chloride 50 mL/hr at 12/26/20 2200    Assessment/Plan:  Lower GI bleed suspect diverticular in nature.  Gastroenterology is setting up for a colonoscopy for tomorrow.  Hemoglobin 13 on presentation and 11.0 on last recheck. Weakness.  Physical therapy evaluation. Anxiety depression on Lexapro and Wellbutrin Impaired fasting glucose checking hemoglobin A1c. Chronic kidney disease stage IIIa.  Dehydration with a creatinine of 1.18 on presentation now down to 1.01 with IV fluids Neuropathy on gabapentin B12 deficiency on  vitamin B12 Essential hypertension holding antihypertensive medications at this point        Code Status:     Code Status Orders  (From admission, onward)           Start     Ordered   12/26/20 1436  Full code  Continuous        12/26/20 1435           Code Status History     Date Active Date Inactive Code Status Order ID Comments User Context   12/26/2020 1434 12/26/2020 1435 Full Code [id number]  Kayleen Memos, DO ED      Family Communication: Spoke with daughter on the phone Disposition Plan: Status is:  Inpatient  Dispo:  Patient From: Home  Planned Disposition: Home, once bleeding stops.  Earliest potential disposition will be tomorrow afternoon.  Medically stable for discharge: No    Consultants: Gastroenterology  Time spent: 28 minutes  [place]

## 2020-12-27 NOTE — Evaluation (Signed)
Occupational Therapy Evaluation Patient Details Name: DANIELLY ACKERLEY MRN: [id number] DOB: Apr 20, 1946 Today's Date: 12/27/2020    History of Present Illness GINIA RUDELL is a 75 y.o. female with medical history significant for chronic constipation, diverticulitis in 2019, post diverticulitis follow-up colonoscopy done on 03/02/2018 revealed extensive diverticulosis involving sigmoid colon, descending colon, transverse colon, ascending colon, mid ascending colon, and nonbleeding internal hemorrhoids, gastritis diagnosed on 02/12/2019, GERD, chronic anxiety/depression, polyneuropathy, essential hypertension, who presented to [facility] ED with complaints of 3 days of bright red blood per rectum associated with dizziness, global weakness and poor oral intake.   Clinical Impression   Ms. Temkin presents today with generalized weakness, limited endurance, and dizziness with OOB mobility. With OOB movement, she states that "I can feel my heart in my chest. I feels like it is pounding really hard." HR in supine 79; increasing to 90 with EOB sitting; and then with slow marching in place in standing, increases to 120. BP supine 141/69; sitting 146/104; standing 109/64. Pt reports being active, walking daily, doing home exercise/stretching program, going out with friends to each most days. She is IND in ADL/IADL, uses no AD. Reports no falls in previous 12 months, although does state that "a few years ago, when I was working, I used to fall all the time." Today Ms. Wrobel is able to transfer, dressing, toilet, INDly, with no LOB, but reports dizziness throughout activities. Endorses mod pain in back and neck at present, stating this is a chronic concern. Despite the hemodynamic instability and dizziness pt displays, pt is able to engage in functional mobility tasks INDly, and does not need ongoing OT at this time. Pt in agreement.    Follow Up Recommendations  No OT follow up    Equipment Recommendations  None  recommended by OT    Recommendations for Other Services       Precautions / Restrictions Precautions Precautions: Fall Restrictions Weight Bearing Restrictions: No      Mobility Bed Mobility Overal bed mobility: Independent                  Transfers Overall transfer level: Independent Equipment used: None                  Balance Overall balance assessment: Needs assistance Sitting-balance support: Bilateral upper extremity supported Sitting balance-Leahy Scale: Good     Standing balance support: Single extremity supported;During functional activity Standing balance-Leahy Scale: Good Standing balance comment: pt endorses dizziness in standing                           ADL either performed or assessed with clinical judgement   ADL Overall ADL's : Independent                                       General ADL Comments: IND in dressing, toileting, grooming     Vision Patient Visual Report: No change from baseline       Perception     Praxis      Pertinent Vitals/Pain Pain Assessment: 0-10 Pain Score: 4  Pain Location: back, neck Pain Intervention(s): Monitored during session;Repositioned     Hand Dominance     Extremity/Trunk Assessment Upper Extremity Assessment Upper Extremity Assessment: Overall WFL for tasks assessed   Lower Extremity Assessment Lower Extremity Assessment: Overall WFL for tasks assessed  Communication Communication Communication: No difficulties   Cognition Arousal/Alertness: Awake/alert Behavior During Therapy: WFL for tasks assessed/performed Overall Cognitive Status: Within Functional Limits for tasks assessed                                     General Comments       Exercises Other Exercises Other Exercises: ADLs, bed mobility and t/fs, therex, educ re role of OT   Shoulder Instructions      Home Living Family/patient expects to be discharged to::  Private residence Living Arrangements: Alone Available Help at Discharge: Friend(s) Type of Home: Apartment Home Access: Stairs to Probation officer of Steps: 3 - to enter apt complex   Home Layout: One level     Bathroom Shower/Tub: Teacher, early years/pre: Standard     Home Equipment: None          Prior Functioning/Environment Level of Independence: Independent                 OT Problem List: Decreased strength;Decreased activity tolerance;Impaired balance (sitting and/or standing)      OT Treatment/Interventions:      OT Goals(Current goals can be found in the care plan section) Acute Rehab OT Goals Patient Stated Goal: to feel better OT Goal Formulation: With patient Time For Goal Achievement: 01/10/21 Potential to Achieve Goals: Good  OT Frequency:     Barriers to D/C:            Co-evaluation              AM-PAC OT "6 Clicks" Daily Activity     Outcome Measure Help from another person eating meals?: None Help from another person taking care of personal grooming?: None Help from another person toileting, which includes using toliet, bedpan, or urinal?: A Little Help from another person bathing (including washing, rinsing, drying)?: A Little Help from another person to put on and taking off regular upper body clothing?: None Help from another person to put on and taking off regular lower body clothing?: None 6 Click Score: 22   End of Session Nurse Communication: Mobility status  Activity Tolerance: Other (comment) (pt limited by dizziness) Patient left: in bed;with nursing/sitter in room;with bed alarm set;with call bell/phone within reach  OT Visit Diagnosis: Unsteadiness on feet (R26.81);History of falling (Z91.81);Dizziness and giddiness (R42)                Time: 5102-5852 OT Time Calculation (min): 29 min Charges:  OT General Charges $OT Visit: 1 Visit OT Evaluation $OT Eval Moderate Complexity: 1  Mod OT Treatments $Self Care/Home Management : 23-37 mins Josiah Lobo, PhD, MS, OTR/L 12/27/20, 10:05 AM

## 2020-12-27 NOTE — Significant Event (Signed)
Rapid Response Event Note   Reason for Call : called RRT for c/o pain: chest/shoulder/stomach/legs   Initial Focused Assessment: Patient laying in bed, alert and oriented x 4, RA, VSS. C/O "feeling funny" as pain as stated above.      Interventions: Dr Leslye Peer to bedside. Stat troponin, tylenol, maalox, xanax, and k-pad ordered.   Plan of Care: Sarah RN and [facility] charge RN to call if further assistance needed.    Event Summary: as above  MD Notified: [place] 1630 Call [place] A, RN

## 2020-12-27 NOTE — Progress Notes (Signed)
Patient ID: Jackie Hunter, female   DOB: [birth year], 75 y.o.   MRN: [id number]  Case discussed with Dr. Nehemiah Massed cardiology. They recommended sending off 2 more troponins. I will get a hemoglobin also. Will also start low-dose Toprol-XL.  Unable to give any blood thinner with GI bleed.  Dr Loletha Grayer

## 2020-12-27 NOTE — Progress Notes (Signed)
Chaplain Maggie made initial visit to meet patient in response to RR code. Chaplain made room for empathetic listening and affirming feelings of fear and sadness. Patient is sad her family is unable to be here during her hospitalization. She expressed the difficulty of not having someone there to help her in the moment. Chaplain offered hospitality and ministry of presence and prayed the [employer] prayer with patient who appreciated the visit. Will follow up later today.

## 2020-12-27 NOTE — Evaluation (Addendum)
Physical Therapy Evaluation Patient Details Name: Jackie [place] MRN: [id number] DOB: 1945/12/09 Today's Date: 12/27/2020   History of Present Illness  Pt is a 75 y/o F who was admitted on 12/26/20 after presenting to ED with c/o 3 days of bright red blood per rectum, dizziness, global weakness & poor oral intake. PMH: chronic constipation, diverticulitis 2019, GERD, chronic anxiety/depression, polyneuropathy, essential HTN, back pain, CTS, cervicalgia, CKD stage 3, lumbar DDD, hiatal hernia, lumbago, stroke, TIA, obesity  Clinical Impression  Addendum: MD cleared pt for participation in PT prior to session.  Pt seen for skilled PT evaluation with pt reporting need to use restroom. Pt quickly transfers [place] bed>bathroom without AD & CGA. Pt doesn't require physical assistance to toilet but once ambulating out of bathroom pt endorses feeling lightheaded. PT provides min assist HHA & assists pt to recliner. Pt noted to have elevated systolic BP & nurse notified. Pt also endorses increasing neck & shoulder pain at that time & nurse made aware. Educated pt on recommendation of STR upon d/c to maximize independence with functional mobility & reduce fall risk as pt is not safe to d/c home alone without any assistance at this time. Will continue to follow pt acutely to focus on balance, activity tolerance, gait & stair negotiation.   Vitals once sitting in recliner after toileting, in RUE: BP 187/75 mmHg (MAP 107), HR 104 bpm Rechecked BP 182/78 mmHg (MAP 102), HR 62 bpm    Follow Up Recommendations SNF;Supervision for mobility/OOB    Equipment Recommendations  None recommended by PT    Recommendations for Other Services       Precautions / Restrictions Precautions Precautions: Fall Restrictions Weight Bearing Restrictions: No      Mobility  Bed Mobility Overal bed mobility: Independent                  Transfers Overall transfer level: Independent Equipment used:  None             General transfer comment: sit<>stand without AD  Ambulation/Gait Ambulation/Gait assistance: Min guard;Min assist Gait Distance (Feet): 10 Feet (+ 10 ft) Assistive device: None;1 person hand held assist Gait Pattern/deviations: Decreased stride length Gait velocity: decreased      Stairs            Wheelchair Mobility    Modified Rankin (Stroke Patients Only)       Balance Overall balance assessment: Needs assistance Sitting-balance support: Bilateral upper extremity supported;Feet supported Sitting balance-Leahy Scale: Good     Standing balance support: During functional activity;No upper extremity supported Standing balance-Leahy Scale: Fair                               Pertinent Vitals/Pain Pain Assessment: Faces Faces Pain Scale: Hurts even more Pain Location: neck & shoulders that began when she started to feel dizzy with mobility Pain Descriptors / Indicators: Grimacing Pain Intervention(s): Monitored during session (notified nurse)    Home Living Family/patient expects to be discharged to:: Private residence Living Arrangements: Alone Available Help at Discharge: Friend(s);Available PRN/intermittently Type of Home: Apartment Home Access: Stairs to enter;Elevator Entrance Stairs-Rails: None Entrance Stairs-Number of Steps: Pt with inconsistent reporting. Pt notes flat entry to personal apartment but endorses anywhere from 2-6 steps to enter apartment complex Home Layout: One level Home Equipment: None      Prior Function Level of Independence: Independent  Comments: per chart, pt independent, driving, caring for herself, cooks & cleans but endorses difficulty with cleaning (pt reports she told OT she has assistance for cleaning but clarified with PT she does not)     Hand Dominance        Extremity/Trunk Assessment   Upper Extremity Assessment Upper Extremity Assessment: Overall WFL for tasks  assessed    Lower Extremity Assessment Lower Extremity Assessment: Generalized weakness       Communication   Communication: No difficulties  Cognition Arousal/Alertness: Awake/alert Behavior During Therapy: WFL for tasks assessed/performed Overall Cognitive Status: Difficult to assess                                 General Comments: Pt with inconsinstent reporting of stairs to access apartment complex, also notes she told OT she has assistance for cleaning but clarified to PT that she does not.      General Comments General comments (skin integrity, edema, etc.): Pt reports need to use restroom & does so without physical assist; pt with BM of bright red blood & nurse reports she's already aware.    Exercises     Assessment/Plan    PT Assessment Patient needs continued PT services  PT Problem List Decreased strength;Decreased mobility;Decreased activity tolerance;Decreased balance;Decreased knowledge of use of DME       PT Treatment Interventions DME instruction;Therapeutic exercise;Gait training;Balance training;Stair training;Neuromuscular re-education;Functional mobility training;Therapeutic activities;Patient/family education    PT Goals (Current goals can be found in the Care Plan section)  Acute Rehab PT Goals Patient Stated Goal: to feel better PT Goal Formulation: With patient Time For Goal Achievement: 01/10/21 Potential to Achieve Goals: Good    Frequency Min 2X/week   Barriers to discharge Decreased caregiver support;Inaccessible home environment lives alone with stairs to access apartment complex, no one available to provide consistent assistance at d/c    Co-evaluation               AM-PAC PT "6 Clicks" Mobility  Outcome Measure Help needed turning from your back to your side while in a flat bed without using bedrails?: None Help needed moving from lying on your back to sitting on the side of a flat bed without using bedrails?:  None Help needed moving to and from a bed to a chair (including a wheelchair)?: A Little Help needed standing up from a chair using your arms (e.g., wheelchair or bedside chair)?: None Help needed to walk in hospital room?: A Little Help needed climbing 3-5 steps with a railing? : A Little 6 Click Score: 21    End of Session   Activity Tolerance: Treatment limited secondary to medical complications (Comment) Patient left: in chair;with call bell/phone within reach;with nursing/sitter in room Nurse Communication: Mobility status (vitals during session) PT Visit Diagnosis: Muscle weakness (generalized) (M62.81);Unsteadiness on feet (R26.81)    Time: 1443-1500 PT Time Calculation (min) (ACUTE ONLY): 17 min   Charges:   PT Evaluation $PT Eval Moderate Complexity: [place], PT, DPT 12/27/20, 4:23 PM   Waunita Schooner 12/27/2020, 4:09 PM

## 2020-12-27 NOTE — Progress Notes (Signed)
Went in to patients room for safety rounding and patient began complaining of neck and jaw pain. Her most recent VS were within defined limits opposed to elevated BP. Patient stated that she has a history of stroke and she felt like the symptoms she was currently experiencing reminded her of that. Called RRT to come evaluate patient. Reached out to provider to inform him of patients condition. Lab called to draw troponin levels. Provider at bedside. Patient began belching so I administered Maalox for indigestion along with xanax for anxiety. Patient states she felt better after taking medications. VS stable at this time. Will continue to monitor.

## 2020-12-27 NOTE — Consult Note (Signed)
Jackie Lame, MD [facility]  [address], [place], [place] [address] Phone: [phone] Fax : [phone]  Consultation  Referring Provider:     Dr. Leslye Peer Primary Care Physician:  Tracie Harrier, MD Primary Gastroenterologist: [facility]         Reason for Consultation:     Rectal bleeding  Date of Admission:  12/26/2020 Date of Consultation:  12/27/2020         HPI:   Jackie [place] is a 75 y.o. female who has a history of diverticulosis and diverticulitis and has had no less than 5 colonoscopy since 2017.  One of them was with a poor prep.  The patient reports that she had all of her colonoscopies by Dr. Gustavo Lah.  She started to experience rectal bleeding without abdominal pain fevers chills nausea or vomiting 4 days ago.  She states that she had 5 bowel movements yesterday with bright red blood and maroon stools.  She has not had any further bleeding since then.  The patient's hemoglobin since admission has gone down slightly and has showed:  Component     Latest Ref Rng & Units 12/26/2020 12/26/2020 12/26/2020 12/27/2020         9:52 AM 12:57 PM  8:15 PM  2:26 AM  Hemoglobin     12.0 - 15.0 g/dL 13.0 12.9 12.2 10.9 (L)  HCT     36.0 - 46.0 % 38.0 38.5 35.9 (L) 32.7 (L)   Component     Latest Ref Rng & Units 12/27/2020         8:22 AM  Hemoglobin     12.0 - 15.0 g/dL 11.0 (L)  HCT     36.0 - 46.0 % 32.1 (L)   The patient denies ever having a diverticular bleed in the past or any rectal bleeding.  She also denies any further bleeding today.  Due to some communication breakdown she asked if her daughter could join the conversation which she did via cell phone.  Past Medical History:  Diagnosis Date  . Adenoma of colon   . Allergic rhinitis   . Anxiety   . Arthritis   . Asthma   . Back pain   . Carpal tunnel syndrome   . Carpal tunnel syndrome   . Carpal tunnel syndrome   . Cataract cortical, senile   . Cervicalgia   . Chronic kidney disease    STAGE 3  .  CKD (chronic kidney disease)   . DDD (degenerative disc disease), lumbar   . DDD (degenerative disc disease), lumbar   . Diverticulosis   . Gastritis   . GERD (gastroesophageal reflux disease)   . Headache   . Hiatal hernia   . History of hiatal hernia   . Hypertension   . Lipids serum increased   . Lumbago   . Migraines   . Obesity   . Osteoarthritis   . Stroke ([place])   . TIA (transient ischemic attack)   . Wrist fracture     Past Surgical History:  Procedure Laterality Date  . BREAST BIOPSY Right 2014   NEG  . BREAST BIOPSY Left 11/24/2016   [place] core, radial scar   . BREAST EXCISIONAL BIOPSY Left 1967   NEG  . cataracts Bilateral   . COLONOSCOPY WITH PROPOFOL N/A 05/03/2016   Procedure: COLONOSCOPY WITH PROPOFOL;  Surgeon: Lollie Sails, MD;  Location[facility] ENDOSCOPY;  Service: Endoscopy;  Laterality: N/A;  . COLONOSCOPY WITH PROPOFOL N/A 11/01/2016  Procedure: COLONOSCOPY WITH PROPOFOL;  Surgeon: Lollie Sails, MD;  Location: [facility] ENDOSCOPY;  Service: Endoscopy;  Laterality: N/A;  . COLONOSCOPY WITH PROPOFOL N/A 11/02/2016   Procedure: COLONOSCOPY WITH PROPOFOL;  Surgeon: Lollie Sails, MD;  Location: [facility] ENDOSCOPY;  Service: Endoscopy;  Laterality: N/A;  . COLONOSCOPY WITH PROPOFOL N/A 12/01/2017   Procedure: COLONOSCOPY WITH PROPOFOL;  Surgeon: Lollie Sails, MD;  Location: [facility] ENDOSCOPY;  Service: Endoscopy;  Laterality: N/A;  . COLONOSCOPY WITH PROPOFOL N/A 03/02/2018   Procedure: COLONOSCOPY WITH PROPOFOL;  Surgeon: Lollie Sails, MD;  Location: [facility] ENDOSCOPY;  Service: Endoscopy;  Laterality: N/A;  . ESOPHAGOGASTRODUODENOSCOPY (EGD) WITH PROPOFOL N/A 10/26/2015   Procedure: ESOPHAGOGASTRODUODENOSCOPY (EGD) WITH PROPOFOL;  Surgeon: [name], MD;  Location: [facility] ENDOSCOPY;  Service: Gastroenterology;  Laterality: N/A;  . ESOPHAGOGASTRODUODENOSCOPY (EGD) WITH PROPOFOL N/A 02/12/2019   Procedure: ESOPHAGOGASTRODUODENOSCOPY (EGD) WITH PROPOFOL;   Surgeon: Lollie Sails, MD;  Location: [facility] ENDOSCOPY;  Service: Endoscopy;  Laterality: N/A;  . EYE SURGERY    . OOPHORECTOMY     left  . OOPHORECTOMY Left     Prior to Admission medications   Medication Sig Start Date End Date Taking? Authorizing Provider  albuterol (VENTOLIN HFA) 108 (90 Base) MCG/ACT inhaler Inhale 2 puffs into the lungs every 4 (four) hours as needed for wheezing or shortness of breath.    [provider]  ALPRAZolam Duanne Moron) 0.25 MG tablet Take 0.25 mg by mouth at bedtime as needed for anxiety.    [provider]  Azelastine HCl 137 MCG/SPRAY SOLN Place 1 spray into both nostrils 2 (two) times daily. 02/14/20   [provider]  buPROPion (ZYBAN) 150 MG 12 hr tablet Take 150 mg by mouth daily.    [provider]  escitalopram (LEXAPRO) 10 MG tablet escitalopram 10 mg tablet    [provider]  esomeprazole (NEXIUM) 40 MG capsule Take by mouth.    [provider]  fexofenadine (ALLEGRA) 60 MG tablet Take 60 mg by mouth 2 (two) times daily as needed for allergies or rhinitis.    [provider]  fluticasone (FLONASE) 50 MCG/ACT nasal spray Place 2 sprays into both nostrils daily.    [provider]  gabapentin (NEURONTIN) 300 MG capsule Take 300 mg by mouth 3 (three) times daily.    [provider]  ibuprofen (ADVIL,MOTRIN) 200 MG tablet Take 200 mg by mouth every 6 (six) hours as needed.    [provider]  lisinopril-hydrochlorothiazide (PRINZIDE,ZESTORETIC) 10-12.5 MG tablet Take 1 tablet by mouth daily.    [provider]  meloxicam (MOBIC) 15 MG tablet meloxicam 15 mg tablet  TAKE 1 TABLET(S) EVERY DAY BY ORAL ROUTE WITH MEALS.    [provider]  metoCLOPramide (REGLAN) 10 MG tablet Take 1 tablet (10 mg total) by mouth every 8 (eight) hours as needed for up to 10 days for nausea or vomiting. 09/01/20 09/11/20  Arta Silence, MD  metroNIDAZOLE (FLAGYL) 250  MG tablet metronidazole 250 mg tablet    [provider]  mometasone (NASONEX) 50 MCG/ACT nasal spray Place 2 sprays into the nose daily.    [provider]  montelukast (SINGULAIR) 10 MG tablet Take by mouth. 03/06/18 03/06/19  [provider]  Omega-3 1000 MG CAPS Take by mouth.    [provider]  omega-3 acid ethyl esters (LOVAZA) 1 g capsule Take by mouth 2 (two) times daily.    [provider]  pantoprazole (PROTONIX) 40 MG  tablet Take 1 tablet (40 mg total) by mouth daily. 09/01/20 10/31/20  Arta Silence, MD  solifenacin (VESICARE) 5 MG tablet Take by mouth. 04/29/13   [provider]  sucralfate (CARAFATE) 1 g tablet Take 1 tablet (1 g total) by mouth 4 (four) times daily as needed for up to 15 days. 09/01/20 09/16/20  Arta Silence, MD  terconazole (TERAZOL 3) 80 MG vaginal suppository Place vaginally. 01/01/19   [provider]  traMADol (ULTRAM) 50 MG tablet Take by mouth every 6 (six) hours as needed.    [provider]  vitamin B-12 (CYANOCOBALAMIN) 1000 MCG tablet Take 1,000 mcg by mouth daily.    [provider]  zolpidem (AMBIEN) 5 MG tablet zolpidem 5 mg tablet    [provider]    Family History  Problem Relation Age of Onset  . Kidney cancer Neg Hx   . Prostate cancer Neg Hx   . Breast cancer Neg Hx      Social History   Tobacco Use  . Smoking status: Never  . Smokeless tobacco: Never  Vaping Use  . Vaping Use: Never used  Substance Use Topics  . Alcohol use: Yes    Comment: rarely  . Drug use: Never    Allergies as of 12/26/2020 - Review Complete 12/26/2020  Allergen Reaction Noted  . Levaquin [levofloxacin in d5w] Other (See Comments) 10/31/2016  . Meloxicam  05/02/2016  . Codeine Anxiety 03/30/2015  . Sucralfate Nausea Only 01/10/2020    Review of Systems:    All systems reviewed and negative except where noted in HPI.   Physical Exam:  Vital signs in  last 24 hours: Temp:  [97.5 F (36.4 C)-97.7 F (36.5 C)] 97.7 F (36.5 C) (06/12 0742) Pulse Rate:  [65-96] 66 (06/12 0742) Resp:  [14-20] 20 (06/12 0742) BP: (109-156)/(48-104) 141/69 (06/12 0925) SpO2:  [95 %-100 %] 100 % (06/12 0742) Last BM Date: 12/26/20 General:   Pleasant, cooperative in NAD Head:  Normocephalic and atraumatic. Eyes:   No icterus.   Conjunctiva pink. PERRLA. Ears:  Normal auditory acuity. Neck:  Supple; no masses or thyroidomegaly Lungs: Respirations even and unlabored. Lungs clear to auscultation bilaterally.   No wheezes, crackles, or rhonchi.  Heart:  Regular rate and rhythm;  Without murmur, clicks, rubs or gallops Abdomen:  Soft, nondistended, nontender. Normal bowel sounds. No appreciable masses or hepatomegaly.  No rebound or guarding.  Rectal:  Not performed. Msk:  Symmetrical without gross deformities.    Extremities:  Without edema, cyanosis or clubbing. Neurologic:  Alert and oriented x3;  grossly normal neurologically. Skin:  Intact without significant lesions or rashes. Cervical Nodes:  No significant cervical adenopathy. Psych:  Alert and cooperative. Normal affect.  LAB RESULTS: Recent Labs    12/26/20 0952 12/26/20 1257 12/26/20 2015 12/27/20 0226 12/27/20 0822  WBC 7.3  --   --  8.7  --   HGB 13.0   < > 12.2 10.9* 11.0*  HCT 38.0   < > 35.9* 32.7* 32.1*  PLT 381  --   --  345  --    < > = values in this interval not displayed.   BMET Recent Labs    12/26/20 0952 12/27/20 0226  NA 133* 134*  K 3.5 4.0  CL 99 101  CO2 22 27  GLUCOSE 160* 140*  BUN 28* 23  CREATININE 1.18* 1.01*  CALCIUM 9.0 8.6*   LFT Recent Labs    12/27/20 0226  PROT 6.3*  ALBUMIN 3.5  AST 24  ALT 16  ALKPHOS 47  BILITOT 0.7   PT/INR No results for input(s): LABPROT, INR in the last 72 hours.  STUDIES: DG Chest 2 View  Result Date: 12/26/2020 CLINICAL DATA:  Shortness of breath EXAM: CHEST - 2 VIEW COMPARISON:  February 12, 2017, September 01, 2020 FINDINGS: The cardiomediastinal silhouette is unchanged in contour.Atherosclerotic calcifications. No pleural effusion. No pneumothorax. No acute pleuroparenchymal abnormality. Visualized abdomen is unremarkable. Multilevel degenerative changes of the thoracic spine. IMPRESSION: No acute cardiopulmonary abnormality. Electronically Signed   By: Valentino Saxon MD   On: 12/26/2020 13:53   CT Angio Abd/Pel W and/or Wo Contrast  Result Date: 12/26/2020 CLINICAL DATA:  75 year old female with rectal bleeding EXAM: CTA ABDOMEN AND PELVIS WITHOUT AND WITH CONTRAST TECHNIQUE: Multidetector CT imaging of the abdomen and pelvis was performed using the standard protocol during bolus administration of intravenous contrast. Multiplanar reconstructed images and MIPs were obtained and reviewed to evaluate the vascular anatomy. CONTRAST:  73mL OMNIPAQUE IOHEXOL 350 MG/ML SOLN COMPARISON:  CT abdomen/pelvis 09/01/2020 FINDINGS: VASCULAR Aorta: Normal caliber aorta without aneurysm, dissection, vasculitis or significant stenosis. Scattered atherosclerotic vascular plaque. Celiac: Patent without evidence of aneurysm, dissection or significant stenosis gas occlusion. Variant anatomy. The celiac axis proper gives rise to the splenic and common hepatic artery. The right inferior phrenic and left gastric artery have a separate origin from the aorta. The lateral segmental branch of the left hepatic artery is replaced to the left gastric artery. SMA: Patent without evidence of aneurysm, dissection, vasculitis or significant stenosis. Renals: Solitary renal arteries bilaterally. The origins are widely patent. Beading in the mid aspect of the right renal artery most consistent with changes of fibromuscular dysplasia. No definite changes within the left renal artery. IMA: Patent without evidence of aneurysm, dissection, vasculitis or significant stenosis. Inflow: Patent without evidence of aneurysm, dissection, vasculitis or  significant stenosis. Proximal Outflow: Bilateral common femoral and visualized portions of the superficial and profunda femoral arteries are patent without evidence of aneurysm, dissection, vasculitis or significant stenosis. Veins: No focal venous abnormality. Review of the MIP images confirms the above findings. NON-VASCULAR Lower chest: No acute abnormality. Hepatobiliary: Geographic hypoattenuation in the left hemi-liver adjacent to the fissure for the falciform ligament is nonspecific but most suggestive of benign focal fatty infiltration. Additionally, the hepatic parenchyma overall is low in attenuation with relative sparing adjacent to the gallbladder fossa consistent with a lesser degree of diffuse focal fatty infiltration. Small benign cyst versus hamartoma in the posterior aspect of the right hepatic dome. Otherwise, no discrete lesion. Gallbladder is unremarkable. No intra or extrahepatic biliary ductal dilatation. Pancreas: Unremarkable. No pancreatic ductal dilatation or surrounding inflammatory changes. Spleen: Normal in size without focal abnormality. Adrenals/Urinary Tract: Normal adrenal glands. No hydronephrosis, nephrolithiasis or enhancing renal aspirated innumerable bilateral low-attenuation renal lesions almost all of which are too small for accurate characterization. Statistically, these are highly likely benign cysts. The ureters and bladder are unremarkable. Stomach/Bowel: Advanced pan colonic diverticulosis. No evidence of active inflammation to suggest diverticulitis. No evidence of contrast extravasation to suggest active arterial bleeding. Normal appendix in the right lower quadrant. No evidence of bowel obstruction. Unremarkable stomach and small bowel. Lymphatic: No suspicious lymphadenopathy. Reproductive: Uterus and bilateral adnexa are unremarkable. Other: No evidence of ascites. Small fat containing umbilical hernia. Musculoskeletal: No acute osseous abnormality. Multilevel  degenerative disc disease. IMPRESSION: 1. Negative for evidence of active GI bleed at this time. 2. Advanced pan colonic diverticulosis. No  evidence of active diverticulitis. 3. Hepatic steatosis. 4. Right mid renal artery fibromuscular dysplasia. No evidence of dissection or aneurysm. 5.  Aortic Atherosclerosis (ICD10-170.0) 6. Numerous small low-attenuation lesions scattered throughout both kidneys which are too small for accurate characterization. Statistically, these are highly likely benign cysts. 7. Small fat containing umbilical hernia. Electronically Signed   By: Jacqulynn Cadet M.D.   On: 12/26/2020 13:52      Impression / Plan:   Assessment: Active Problems:   GI bleed   Lower GI bleed   [place] is a 75 y.o. y/o female with a history of diverticulosis with diverticulitis in the past.  The patient's daughter also states that the patient has had polyps in the past.  The patient now comes in with signs and symptoms consistent with a lower GI bleed.  She does not present as an upper GI bleed since there is a lack of hypotension and hematemesis melena or a significant drop in hemoglobin to explain red blood from a rapidly bleeding upper GI bleed.  Therefore this is most likely a lower GI bleed and given that the patient's undergone 5 colonoscopies since 2017 with diverticulosis found it is likely a diverticular bleed.  Plan:  The patient has been explained, as has her daughter, the likelihood of this being a diverticular bleed.  Since the patient's last colonoscopy was in 2019 she will be set up for repeat colonoscopy for tomorrow.  The patient will be given a prep today.  The patient has been explained the plan and agrees with it.  Thank you for involving me in the care of this patient.      LOS: 0 days   Jackie Lame, MD, [facility] 12/27/2020, 10:28 AM,  Pager [phone] 7am-5pm  Check AMION for 5pm -7am coverage and on weekends   Note: This dictation was prepared with Dragon  dictation along with smaller phrase technology. Any transcriptional errors that result from this process are unintentional.

## 2020-12-27 NOTE — Progress Notes (Signed)
Patient ID: [place], female   DOB: 12/19/1945, 75 y.o.   MRN: [id number]  Rapid response called on patient.  Patient states that she feels funny all over.  Her legs were shaking.  Having some epigastric discomfort.  No chest pain.  Patient was given a dose of Maalox and Xanax and feels better now.  She has some pain in her left upper back neck area.  Patient nervous about all the testing.  She started the colonoscopy prep and having red bowel movements.  Patient given a dose of Maalox and Xanax and seems better at this point.  Possibly a panic attack.  First troponin sent earlier is negative.  Will evaluate second troponin.  Most recent hemoglobin 10.2.  Continue gentle fluids.  Physical exam.  Left back pain to palpation but patient states it felt good with a massage. Cardiovascular S1-S2 normal no gallops rubs or murmurs. Lungs clear to auscultation Neuro exam patient able to move all of her extremities to command.  Power 5 out of 5 upper and lower extremities.  Patient able to straight leg raise.  Continue to monitor closely. Since patient feels better after Xanax and Maalox will observe right now.  We will give K pad for her left upper back and neck.  Dr Loletha Grayer

## 2020-12-27 NOTE — Progress Notes (Addendum)
Per tele report, patient is having inverted T waves at leads 2, 3, V , and MCL. Reached out to provider to inform him of this change. EKG ordered STAT. Will carry out order and continue to monitor.

## 2020-12-27 NOTE — Progress Notes (Addendum)
Patient ID: Jackie Hunter, female   DOB: 03-19-1946, 75 y.o.   MRN: [id number]  Notified telemetry showed flipped T waves.  Will obtain an EKG.  And continue to monitor on telemetry.  No symptoms as per nursing staff.  Dr Loletha Grayer  Addendum.  Patient has flipped T waves laterally and flattening of T waves inferiorly.  No complaints of chest pain.  Does have a little dizziness with standing.  Will check orthostatic vital signs.  Does have a little shortness of breath with walking.  Continue to monitor on telemetry.  Will ask cardiology to see prior to colonoscopy.  Dr. Loletha Grayer

## 2020-12-28 ENCOUNTER — Inpatient Hospital Stay: Payer: 59 | Admitting: Anesthesiology

## 2020-12-28 ENCOUNTER — Encounter: Payer: Self-pay | Admitting: Internal Medicine

## 2020-12-28 ENCOUNTER — Encounter
Admission: EM | Disposition: A | Discharge status: Home Health | Payer: 59 | Source: Home / Self Care | Attending: Internal Medicine

## 2020-12-28 DIAGNOSIS — D62 Acute posthemorrhagic anemia: Secondary | ICD-10-CM

## 2020-12-28 DIAGNOSIS — K922 Gastrointestinal hemorrhage, unspecified: Secondary | ICD-10-CM | POA: Diagnosis not present

## 2020-12-28 DIAGNOSIS — K5790 Diverticulosis of intestine, part unspecified, without perforation or abscess without bleeding: Secondary | ICD-10-CM

## 2020-12-28 DIAGNOSIS — E119 Type 2 diabetes mellitus without complications: Secondary | ICD-10-CM

## 2020-12-28 DIAGNOSIS — R531 Weakness: Secondary | ICD-10-CM | POA: Diagnosis not present

## 2020-12-28 DIAGNOSIS — F419 Anxiety disorder, unspecified: Secondary | ICD-10-CM | POA: Diagnosis not present

## 2020-12-28 DIAGNOSIS — K625 Hemorrhage of anus and rectum: Secondary | ICD-10-CM | POA: Diagnosis not present

## 2020-12-28 DIAGNOSIS — N182 Chronic kidney disease, stage 2 (mild): Secondary | ICD-10-CM

## 2020-12-28 HISTORY — PX: COLONOSCOPY WITH PROPOFOL: SHX5780

## 2020-12-28 LAB — HEMOGLOBIN
Hemoglobin: 8.5 g/dL — ABNORMAL LOW (ref 12.0–15.0)
Hemoglobin: 7.5 g/dL — ABNORMAL LOW (ref 12.0–15.0)

## 2020-12-28 LAB — BASIC METABOLIC PANEL
Anion gap: 4 — ABNORMAL LOW (ref 5–15)
BUN: 13 mg/dL (ref 8–23)
CO2: 27 mmol/L (ref 22–32)
Calcium: 7.9 mg/dL — ABNORMAL LOW (ref 8.9–10.3)
Chloride: 106 mmol/L (ref 98–111)
Creatinine, Ser: 0.95 mg/dL (ref 0.44–1.00)
GFR, Estimated: >60 mL/min (ref >60)
Glucose, Bld: 119 mg/dL — ABNORMAL HIGH (ref 70–99)
Potassium: 3.7 mmol/L (ref 3.5–5.1)
Sodium: 137 mmol/L (ref 135–145)

## 2020-12-28 LAB — IRON AND TIBC
Iron: 64 ug/dL (ref 28–170)
Saturation Ratios: 20 % (ref 10.4–31.8)
TIBC: 314 ug/dL (ref 250–450)
UIBC: 250 ug/dL

## 2020-12-28 LAB — HEMOGLOBIN A1C
Hgb A1c MFr Bld: 6.6 % — ABNORMAL HIGH (ref 4.8–5.6)
Hgb A1c MFr Bld: 6.6 % — ABNORMAL HIGH (ref 4.8–5.6)
Mean Plasma Glucose: 143 mg/dL
Mean Plasma Glucose: 143 mg/dL

## 2020-12-28 LAB — GLUCOSE, CAPILLARY
Glucose-Capillary: 117 mg/dL — ABNORMAL HIGH (ref 70–99)
Glucose-Capillary: 118 mg/dL — ABNORMAL HIGH (ref 70–99)
Glucose-Capillary: 140 mg/dL — ABNORMAL HIGH (ref 70–99)

## 2020-12-28 LAB — PREPARE RBC (CROSSMATCH)

## 2020-12-28 LAB — FOLATE: Folate: 35 ng/mL (ref >5.9)

## 2020-12-28 LAB — ABO/RH: ABO/RH(D): O POS

## 2020-12-28 LAB — FERRITIN: Ferritin: 38 ng/mL (ref 11–307)

## 2020-12-28 SURGERY — COLONOSCOPY WITH PROPOFOL
Anesthesia: General

## 2020-12-28 MED ORDER — PROPOFOL 10 MG/ML IV BOLUS
INTRAVENOUS | Status: DC | PRN
  Administered 2020-12-28: 40 mg via INTRAVENOUS

## 2020-12-28 MED ORDER — PROPOFOL 500 MG/50ML IV EMUL
INTRAVENOUS | Status: DC | PRN
  Administered 2020-12-28: 100 ug/kg/min via INTRAVENOUS

## 2020-12-28 MED ORDER — SODIUM CHLORIDE 0.9 % IV SOLN
400.0000 mg | Freq: Once | INTRAVENOUS | Status: AC
  Administered 2020-12-29: 10:00:00 400 mg via INTRAVENOUS
  Filled 2020-12-28: qty 20

## 2020-12-28 MED ORDER — LIDOCAINE HCL (CARDIAC) PF 100 MG/5ML IV SOSY
PREFILLED_SYRINGE | INTRAVENOUS | Status: DC | PRN
  Administered 2020-12-28: 80 mg via INTRAVENOUS

## 2020-12-28 MED ORDER — PROPOFOL 10 MG/ML IV BOLUS
INTRAVENOUS | Status: AC
  Filled 2020-12-28: qty 40

## 2020-12-28 MED ORDER — SODIUM CHLORIDE 0.9% IV SOLUTION
Freq: Once | INTRAVENOUS | Status: AC
Start: 2020-12-28 — End: 2020-12-28

## 2020-12-28 MED ORDER — PHENYLEPHRINE HCL (PRESSORS) 10 MG/ML IV SOLN
INTRAVENOUS | Status: DC | PRN
  Administered 2020-12-28 (×3): 100 ug via INTRAVENOUS

## 2020-12-28 MED ORDER — EPHEDRINE SULFATE 50 MG/ML IJ SOLN
INTRAMUSCULAR | Status: DC | PRN
  Administered 2020-12-28: 5 mg via INTRAVENOUS

## 2020-12-28 MED ORDER — SODIUM CHLORIDE 0.9 % IV SOLN: INTRAVENOUS | Status: DC | PRN

## 2020-12-28 MED ORDER — EPHEDRINE 5 MG/ML INJ
INTRAVENOUS | Status: AC
  Filled 2020-12-28: qty 10

## 2020-12-28 MED ORDER — ACETAMINOPHEN 325 MG PO TABS: 650.0000 mg | ORAL_TABLET | Freq: Once | ORAL | Status: DC

## 2020-12-28 MED ORDER — FUROSEMIDE 10 MG/ML IJ SOLN: 20.0000 mg | Freq: Once | INTRAMUSCULAR | Status: DC

## 2020-12-28 MED ORDER — PROPOFOL 500 MG/50ML IV EMUL
INTRAVENOUS | Status: AC
  Filled 2020-12-28: qty 50

## 2020-12-28 MED ORDER — ONDANSETRON HCL 4 MG/2ML IJ SOLN: 4.0000 mg | Freq: Once | INTRAMUSCULAR | Status: DC | PRN

## 2020-12-28 MED ORDER — FENTANYL CITRATE (PF) 100 MCG/2ML IJ SOLN: 25.0000 ug | INTRAMUSCULAR | Status: DC | PRN

## 2020-12-28 NOTE — Op Note (Signed)
[facility] Patient Name: Jackie Hunter Procedure Date: 12/28/2020 4:16 PM MRN: [id number] Account #: [id number] Date of Birth: [birth year] Admit Type: Outpatient Age: 75 Room: [facility] ENDO ROOM 1 Gender: Female Note Status: Finalized Procedure:             Colonoscopy Indications:           Last colonoscopy: August 2019, Hematochezia, Rectal                         bleeding Providers:             Lin Landsman MD, MD Referring MD:          Tracie Harrier, MD (Referring MD) Medicines:             General Anesthesia Complications:         No immediate complications. Estimated blood loss: None. Procedure:             Pre-Anesthesia Assessment:                        - Prior to the procedure, a History and Physical was                         performed, and patient medications and allergies were                         reviewed. The patient is competent. The risks and                         benefits of the procedure and the sedation options and                         risks were discussed with the patient. All questions                         were answered and informed consent was obtained.                         Patient identification and proposed procedure were                         verified by the physician, the nurse, the                         anesthesiologist, the anesthetist and the technician                         in the pre-procedure area in the procedure room in the                         endoscopy suite. Mental Status Examination: alert and                         oriented. Airway Examination: normal oropharyngeal                         airway and neck mobility. Respiratory Examination:  clear to auscultation. CV Examination: normal.                         Prophylactic Antibiotics: The patient does not require                         prophylactic antibiotics. Prior Anticoagulants: The                          patient has taken no previous anticoagulant or                         antiplatelet agents. ASA Grade Assessment: III - A                         patient with severe systemic disease. After reviewing                         the risks and benefits, the patient was deemed in                         satisfactory condition to undergo the procedure. The                         anesthesia plan was to use general anesthesia.                         Immediately prior to administration of medications,                         the patient was re-assessed for adequacy to receive                         sedatives. The heart rate, respiratory rate, oxygen                         saturations, blood pressure, adequacy of pulmonary                         ventilation, and response to care were monitored                         throughout the procedure. The physical status of the                         patient was re-assessed after the procedure.                        After obtaining informed consent, the colonoscope was                         passed under direct vision. Throughout the procedure,                         the patient's blood pressure, pulse, and oxygen                         saturations were monitored continuously. The  Colonoscope was introduced through the anus and                         advanced to the the terminal ileum. The colonoscopy                         was performed with moderate difficulty due to                         excessive bleeding, multiple diverticula in the colon                         and significant looping. Successful completion of the                         procedure was aided by applying abdominal pressure and                         lavage. The patient tolerated the procedure well. The                         quality of the bowel preparation was fair. Findings:      The perianal and digital rectal examinations were normal.  Pertinent       negatives include normal sphincter tone and no palpable rectal lesions.      Scattered small and large-mouthed diverticula were found in the entire       colon.      Red blood was found in the rectum, in the sigmoid colon, in the       descending colon, in the transverse colon and at the hepatic flexure.       There was no evidence of active bleeding in the entire colon after       thorough lavaga and careful examination of the diverticula in all       segments of colon. Yellow liquid stool was found in TI, cecum and       ascending colon      The retroflexed view of the distal rectum and anal verge was normal and       showed no anal or rectal abnormalities.      The terminal ileum appeared normal. Impression:            - Preparation of the colon was fair.                        - Diverticulosis in the entire examined colon, souce                         of rectal bleeding.                        - Blood in the rectum, in the sigmoid colon, in the                         descending colon, in the transverse colon and at the                         hepatic flexure.                        -  The distal rectum and anal verge are normal on                         retroflexion view.                        - The examined portion of the ileum was normal.                        - No specimens collected. Recommendation:        - Return patient to hospital ward for ongoing care.                        - Full liquid diet today.                        - Continue present medications.                        - Do a GI bleeding (tagged RBC) scan if rectal                         bleeding recurs                        - Avoid constipation. Procedure Code(s):     --- Professional ---                        [phone], Colonoscopy, flexible; diagnostic, including                         collection of specimen(s) by brushing or washing, when                         performed (separate  procedure) Diagnosis Code(s):     --- Professional ---                        K62.5, Hemorrhage of anus and rectum                        K92.2, Gastrointestinal hemorrhage, unspecified                        K92.1, Melena (includes Hematochezia)                        K57.30, Diverticulosis of large intestine without                         perforation or abscess without bleeding CPT copyright 2019 American Medical Association. All rights reserved. The codes documented in this report are preliminary and upon coder review may  be revised to meet current compliance requirements. Dr. Ulyess Mort Lin Landsman MD, MD 12/28/2020 5:13:28 PM This report has been signed electronically. Number of Addenda: 0 Note Initiated On: 12/28/2020 4:16 PM Scope Withdrawal Time: 0 hours 21 minutes 34 seconds  Total Procedure Duration: 0 hours 32 minutes 19 seconds  Estimated Blood Loss:  Estimated blood loss: none.      [facility]

## 2020-12-28 NOTE — Anesthesia Preprocedure Evaluation (Signed)
Anesthesia Evaluation  Patient identified by MRN, date of birth, ID band Patient awake    Reviewed: Allergy & Precautions, H&P , NPO status , Patient's Chart, lab work & pertinent test results, reviewed documented beta blocker date and time   History of Anesthesia Complications Negative for: history of anesthetic complications  Airway Mallampati: II   Neck ROM: full    Dental  (+) Poor Dentition, Teeth Intact   Pulmonary neg shortness of breath, asthma , neg COPD, neg recent URI,           Cardiovascular Exercise Tolerance: Good hypertension, (-) angina(-) CAD, (-) Past MI, (-) Cardiac Stents and (-) CABG (-) dysrhythmias (-) Valvular Problems/Murmurs     Neuro/Psych  Headaches, neg Seizures PSYCHIATRIC DISORDERS Anxiety Depression TIA Neuromuscular disease CVA, No Residual Symptoms    GI/Hepatic Neg liver ROS, hiatal hernia, GERD  Medicated,  Endo/Other  diabetes  Renal/GU CRFRenal disease  negative genitourinary   Musculoskeletal  (+) Arthritis , Osteoarthritis,    Abdominal   Peds  Hematology negative hematology ROS (+) anemia ,   Anesthesia Other Findings Past Medical History: No date: Adenoma of colon No date: Allergic rhinitis No date: Anxiety No date: Arthritis No date: Asthma No date: Back pain No date: Carpal tunnel syndrome No date: Cervicalgia No date: Chronic kidney disease     Comment: STAGE 3 No date: DDD (degenerative disc disease), lumbar No date: GERD (gastroesophageal reflux disease) No date: Headache No date: History of hiatal hernia No date: Hypertension No date: Lipids serum increased No date: Lumbago No date: Migraines No date: Obesity No date: Osteoarthritis No date: Stroke [facility]) No date: TIA (transient ischemic attack) No date: Wrist fracture Past Surgical History: 2014: BREAST BIOPSY Right     Comment: NEG 1967: BREAST EXCISIONAL BIOPSY Left     Comment: NEG No date:  cataracts Bilateral 05/03/2016: COLONOSCOPY WITH PROPOFOL N/A     Comment: Procedure: COLONOSCOPY WITH PROPOFOL;                Surgeon: Lollie Sails, MD;  Location: [facility]              ENDOSCOPY;  Service: Endoscopy;  Laterality:               N/A; 10/26/2015: ESOPHAGOGASTRODUODENOSCOPY (EGD) WITH PROPOFOL N/A     Comment: Procedure: ESOPHAGOGASTRODUODENOSCOPY (EGD)               WITH PROPOFOL;  Surgeon: [name], MD;                Location: [facility] ENDOSCOPY;  Service:               Gastroenterology;  Laterality: N/A; No date: EYE SURGERY No date: OOPHORECTOMY     Comment: left   Reproductive/Obstetrics negative OB ROS                             Anesthesia Physical  Anesthesia Plan  ASA: 3 and emergent  Anesthesia Plan: General   Post-op Pain Management:    Induction: Intravenous  PONV Risk Score and Plan: 3 and Propofol infusion  Airway Management Planned: Natural Airway and Nasal Cannula  Additional Equipment:   Intra-op Plan:   Post-operative Plan:   Informed Consent: I have reviewed the patients History and Physical, chart, labs and discussed the procedure including the risks, benefits and alternatives for the proposed anesthesia with the patient or authorized representative who  has indicated his/her understanding and acceptance.     Dental Advisory Given  Plan Discussed with: CRNA  Anesthesia Plan Comments:         Anesthesia Quick Evaluation

## 2020-12-28 NOTE — Progress Notes (Signed)
Transport is bringing patient back to the room and informs me that colonoscopy procedure was cancelled and rescheduled for a later time today. Patient is expressing disappointment in waiting longer to eat and drink. She is complaining of headache at this time. Reached out to endoscopy team to inform them of patients concerns and complaints of headache. Per the nurse in endoscopy she informs me that patient can have PRN med and clear liquids up to 2 hours before procedure which has now been rescheduled to 1600. Will medicate for pain and allow clear liquids until 1400.

## 2020-12-28 NOTE — Transfer of Care (Signed)
Immediate Anesthesia Transfer of Care Note  Patient: Jackie Hunter  Procedure(s) Performed: COLONOSCOPY WITH PROPOFOL  Patient Location: PACU and Endoscopy Unit  Anesthesia Type:General  Level of Consciousness: drowsy and patient cooperative  Airway & Oxygen Therapy: Patient Spontanous Breathing  Post-op Assessment: Report given to RN and Post -op Vital signs reviewed and stable  Post vital signs: Reviewed and stable  Last Vitals:  Vitals Value Taken Time  BP 109/47 12/28/20 1704  Temp    Pulse 79 12/28/20 1705  Resp 17 12/28/20 1705  SpO2 98 % 12/28/20 1705  Vitals shown include unvalidated device data.  Last Pain:  Vitals:   12/28/20 1544  TempSrc: Temporal  PainSc: 0-No pain         Complications: No notable events documented.

## 2020-12-28 NOTE — Progress Notes (Signed)
Patient ID: Jackie Hunter, female   DOB: 1945/11/16, 75 y.o.   MRN: [id number] [facility] PROGRESS NOTE  Jackie Hunter [id number]:[id number] DOB: 04/24/1946 DOA: 12/26/2020 PCP: Tracie Harrier, MD  HPI/Subjective: Patient seen this morning.  She states she is very nervous about things.  Felt better when I saw her this morning.  Hemoglobin this morning dropped down to 8.5.  I explained that she may need blood if she drops down any further.  Objective: Vitals:   12/28/20 1153 12/28/20 1544  BP: (!) 141/55 (!) 129/55  Pulse: 66 65  Resp:  20  Temp: (!) 97.5 F (36.4 C) (!) 97 F (36.1 C)  SpO2: 100% 100%   No intake or output data in the 24 hours ending 12/28/20 1546 Filed Weights   12/26/20 0944 12/28/20 1544  Weight: 83.9 kg 83.9 kg    ROS: Review of Systems  Respiratory:  Negative for shortness of breath.   Cardiovascular:  Negative for chest pain.  Gastrointestinal:  Positive for blood in stool. Negative for abdominal pain, nausea and vomiting.  Exam: Physical Exam HENT:     Head: Normocephalic.     Mouth/Throat:     Pharynx: No oropharyngeal exudate.  Eyes:     General: Lids are normal.     Conjunctiva/sclera: Conjunctivae normal.     Pupils: Pupils are equal, round, and reactive to light.  Cardiovascular:     Rate and Rhythm: Normal rate and regular rhythm.     Heart sounds: Normal heart sounds, S1 normal and S2 normal.  Pulmonary:     Breath sounds: No decreased breath sounds, wheezing, rhonchi or rales.  Abdominal:     Palpations: Abdomen is soft.     Tenderness: There is no abdominal tenderness.  Musculoskeletal:     Right lower leg: No swelling.     Left lower leg: No swelling.  Skin:    General: Skin is warm.     Findings: No rash.  Neurological:     Mental Status: She is alert and oriented to person, place, and time.     Data Reviewed: Basic Metabolic Panel: Recent Labs  Lab 12/26/20 0952 12/27/20 0226 12/28/20 0641  NA 133* 134* 137  K  3.5 4.0 3.7  CL 99 101 106  CO2 22 27 27   GLUCOSE 160* 140* 119*  BUN 28* 23 13  CREATININE 1.18* 1.01* 0.95  CALCIUM 9.0 8.6* 7.9*  MG  --  2.2  --   PHOS  --  3.8  --    Liver Function Tests: Recent Labs  Lab 12/26/20 0952 12/27/20 0226  AST 27 24  ALT 18 16  ALKPHOS 56 47  BILITOT 0.8 0.7  PROT 7.7 6.3*  ALBUMIN 4.1 3.5   CBC: Recent Labs  Lab 12/26/20 0952 12/26/20 1257 12/26/20 2015 12/27/20 0226 12/27/20 0822 12/27/20 1524 12/28/20 0641 12/28/20 1431  WBC 7.3  --   --  8.7  --   --   --   --   HGB 13.0 12.9 12.2 10.9* 11.0* 10.2* 8.5* 7.5*  HCT 38.0 38.5 35.9* 32.7* 32.1*  --   --   --   MCV 88.6  --   --  90.1  --   --   --   --   PLT 381  --   --  345  --   --   --   --      CBG: Recent Labs  Lab 12/27/20 1132 12/27/20 1535 12/27/20  2144 12/28/20 0746 12/28/20 1154  GLUCAP 127* 120* 113* 117* 118*    Recent Results (from the past 240 hour(s))  Resp Panel by RT-PCR (Flu A&B, Covid) Nasopharyngeal Swab     Status: None   Collection Time: 12/26/20 12:57 PM   Specimen: Nasopharyngeal Swab; Nasopharyngeal(NP) swabs in vial transport medium  Result Value Ref Range Status   SARS Coronavirus 2 by RT PCR NEGATIVE NEGATIVE Final    Comment: (NOTE) SARS-CoV-2 target nucleic acids are NOT DETECTED.  The SARS-CoV-2 RNA is generally detectable in upper respiratory specimens during the acute phase of infection. The lowest concentration of SARS-CoV-2 viral copies this assay can detect is 138 copies/mL. A negative result does not preclude SARS-Cov-2 infection and should not be used as the sole basis for treatment or other patient management decisions. A negative result may occur with  improper specimen collection/handling, submission of specimen other than nasopharyngeal swab, presence of viral mutation(s) within the areas targeted by this assay, and inadequate number of viral copies(<138 copies/mL). A negative result must be combined with clinical  observations, patient history, and epidemiological information. The expected result is Negative.  Fact Sheet for Patients:  EntrepreneurPulse.com.au  Fact Sheet for Healthcare Providers:  IncredibleEmployment.be  This test is no t yet approved or cleared by the [place] FDA and  has been authorized for detection and/or diagnosis of SARS-CoV-2 by FDA under an Emergency Use Authorization (EUA). This EUA will remain  in effect (meaning this test can be used) for the duration of the COVID-19 declaration under Section 564(b)(1) of the Act, 21 U.S.C.section 360bbb-3(b)(1), unless the authorization is terminated  or revoked sooner.       Influenza A by PCR NEGATIVE NEGATIVE Final   Influenza B by PCR NEGATIVE NEGATIVE Final    Comment: (NOTE) The Xpert Xpress SARS-CoV-2/FLU/RSV plus assay is intended as an aid in the diagnosis of influenza from Nasopharyngeal swab specimens and should not be used as a sole basis for treatment. Nasal washings and aspirates are unacceptable for Xpert Xpress SARS-CoV-2/FLU/RSV testing.  Fact Sheet for Patients: EntrepreneurPulse.com.au  Fact Sheet for Healthcare Providers: IncredibleEmployment.be  This test is not yet approved or cleared by the [place] FDA and has been authorized for detection and/or diagnosis of SARS-CoV-2 by FDA under an Emergency Use Authorization (EUA). This EUA will remain in effect (meaning this test can be used) for the duration of the COVID-19 declaration under Section 564(b)(1) of the Act, 21 U.S.C. section 360bbb-3(b)(1), unless the authorization is terminated or revoked.  Performed at [facility], [address] [place], [place] [address]       Scheduled Meds:  sodium chloride   Intravenous Once   acetaminophen  650 mg Oral Once   [facility] Hold] buPROPion  150 mg Oral Daily   [MAR Hold] escitalopram  10 mg Oral Daily    furosemide  20 mg Intravenous Once   [MAR Hold] gabapentin  300 mg Oral TID   [MAR Hold] insulin aspart  0-5 Units Subcutaneous QHS   [MAR Hold] insulin aspart  0-9 Units Subcutaneous TID WC   [MAR Hold] metoprolol succinate  12.5 mg Oral Daily   [MAR Hold] pantoprazole (PROTONIX) IV  40 mg Intravenous Q12H   [MAR Hold] vitamin B-12  1,000 mcg Oral Daily     Assessment/Plan:  Acute blood loss anemia.  Hemoglobin 13 on presentation down to 8.5 this morning.  This afternoon down to 7.5 we will give 1 unit of packed red blood cells  and continue to monitor.  We will also give IV iron tomorrow morning.  Discontinue IV fluids to stop diluting the patient down further. Suspected lower GI bleed down for colonoscopy currently. Weakness.  Physical therapy recommended rehab.  Patient would like to work with physical therapy after done with colonoscopy see if she does better. Anxiety depression on Lexapro and Wellbutrin Type 2 diabetes mellitus with hemoglobin A1c 6.6.  Diet controlled. Chronic kidney disease stage II.  Creatinine down to 0.95 with GFR greater than 60 Vitamin B12 deficiency on vitamin B12 Panic attack yesterday given a dose of Xanax which seemed to help Essential hypertension.  On low-dose Toprol Flipped T waves.  Cardiac enzymes negative stable to undergo procedure.  [facility] consultation.    Code Status:     Code Status Orders  (From admission, onward)           Start     Ordered   12/26/20 1436  Full code  Continuous        12/26/20 1435           Code Status History     Date Active Date Inactive Code Status Order ID Comments User Context   12/26/2020 1434 12/26/2020 1435 Full Code [id number]  Kayleen Memos, DO ED      Family Communication: Spoke with the patient's daughter on the phone this morning Disposition Plan: Status is: Inpatient  Dispo:  Patient From: Home  Planned Disposition: Home  Medically stable for discharge: No, transfusing 1  unit of packed red blood cells today.  Will need IV iron tomorrow.  Consultants: Gastroenterology  Time spent: 28 minutes  [place]

## 2020-12-28 NOTE — TOC Progression Note (Signed)
Transition of Care [facility]) - Progression Note    Patient Details  Name: Jackie Hunter MRN: [id number] Date of Birth: [birth year]  Transition of Care [facility]) CM/SW [place], RN Phone Number: 12/28/2020, 10:17 AM  Clinical Narrative:   [facility] applied for, SNF bed search started         Expected Discharge Plan and Services                                                 Social Determinants of Health (SDOH) Interventions    Readmission Risk Interventions No flowsheet data found.

## 2020-12-28 NOTE — Anesthesia Postprocedure Evaluation (Signed)
Anesthesia Post Note  Patient: Jackie Hunter  Procedure(s) Performed: COLONOSCOPY WITH PROPOFOL  Patient location during evaluation: Endoscopy Anesthesia Type: General Level of consciousness: awake and alert and oriented Pain management: pain level controlled Vital Signs Assessment: post-procedure vital signs reviewed and stable Respiratory status: spontaneous breathing Cardiovascular status: blood pressure returned to baseline Anesthetic complications: no   No notable events documented.   Last Vitals:  Vitals:   12/28/20 1704 12/28/20 1714  BP: (!) 109/47 110/61  Pulse: 80 79  Resp: (!) 22   Temp: 36.8 C   SpO2:      Last Pain:  Vitals:   12/28/20 1714  TempSrc:   PainSc: 0-No pain                 Netty Sullivant

## 2020-12-28 NOTE — Progress Notes (Signed)
Patient scheduled for colonoscopy procedure today at 1300. She has been NPO since midnight and stools are clear. Transport arrives at this time to take her to Endoscopy.

## 2020-12-28 NOTE — Progress Notes (Signed)
Patient Name: Jackie Hunter Date of Encounter: 12/28/2020  Hospital Problem List     Active Problems:   Anxiety and depression   GI bleed   Lower GI bleed   Weakness   Impaired fasting glucose   B12 deficiency    Patient Profile     75 year old female with history of chronic constipation, diverticulitis, hypertension who was admitted with 3-day history of dizziness and bright red blood per rectum.  CTA of the abdomen pelvis with and without contrast showed no active bleeding however extensive diverticulosis.  Hemoglobin was 13.  She was admitted.  EKG on admission showed sinus tachycardia at 101 bpm but otherwise negative.  Hemoglobin on admission was 13 today is 8.5.  Renal function was mildly reduced on admission with a creatinine of 1.32.  Today 0.95.  Patient apparently was noted to have T wave inversions in the inferior leads on telemetry.  Troponin was drawn which was normal.  Patient was asymptomatic with regard to the chest pain.  Patient was started on Toprol.  Patient had shaking and epigastric discomfort yesterday afternoon.  Subjective   Patient denies any chest pain or shortness of breath.  She states she was very anxious last night and yesterday afternoon regarding her GI procedures.  She is pain-free and denies any symptoms at present.  She denies any prior cardiac history.  Inpatient Medications     buPROPion  150 mg Oral Daily   escitalopram  10 mg Oral Daily   gabapentin  300 mg Oral TID   insulin aspart  0-5 Units Subcutaneous QHS   insulin aspart  0-9 Units Subcutaneous TID WC   metoprolol succinate  12.5 mg Oral Daily   pantoprazole (PROTONIX) IV  40 mg Intravenous Q12H   vitamin B-12  1,000 mcg Oral Daily    Vital Signs    Vitals:   12/27/20 1535 12/27/20 1642 12/27/20 2007 12/28/20 0530  BP: (!) 165/75 (!) 183/84 (!) 154/78 (!) 118/49  Pulse: 69  (!) 58 (!) 55  Resp: 18  16 18   Temp: (!) 97.3 F (36.3 C)  97.6 F (36.4 C) (!) 97.5 F (36.4 C)   TempSrc:      SpO2: 100% 100% 100% 100%  Weight:      Height:        Intake/Output Summary (Last 24 hours) at 12/28/2020 0813 Last data filed at 12/27/2020 1354 Gross per 24 hour  Intake 610 ml  Output --  Net 610 ml   Filed Weights   12/26/20 0944  Weight: 83.9 kg    Physical Exam    .  HEENT: normal.  Neck: Supple, no JVD, carotid bruits, or masses. Cardiac: RRR, no murmurs, rubs, or gallops. No clubbing, cyanosis, edema.  Radials/DP/PT 2+ and equal bilaterally.  Respiratory:  Respirations regular and unlabored, clear to auscultation bilaterally. GI: Soft, nontender, nondistended, BS + x 4. MS: no deformity or atrophy. Skin: warm and dry, no rash. Neuro:  Strength and sensation are intact. Psych: Anxious.  Labs    CBC Recent Labs    12/26/20 0952 12/26/20 1257 12/27/20 0226 12/27/20 0822 12/27/20 1524 12/28/20 0641  WBC 7.3  --  8.7  --   --   --   HGB 13.0   < > 10.9* 11.0* 10.2* 8.5*  HCT 38.0   < > 32.7* 32.1*  --   --   MCV 88.6  --  90.1  --   --   --   PLT  381  --  345  --   --   --    < > = values in this interval not displayed.   Basic Metabolic Panel Recent Labs    12/27/20 0226 12/28/20 0641  NA 134* 137  K 4.0 3.7  CL 101 106  CO2 27 27  GLUCOSE 140* 119*  BUN 23 13  CREATININE 1.01* 0.95  CALCIUM 8.6* 7.9*  MG 2.2  --   PHOS 3.8  --    Liver Function Tests Recent Labs    12/26/20 0952 12/27/20 0226  AST 27 24  ALT 18 16  ALKPHOS 56 47  BILITOT 0.8 0.7  PROT 7.7 6.3*  ALBUMIN 4.1 3.5   No results for input(s): LIPASE, AMYLASE in the last 72 hours. Cardiac Enzymes No results for input(s): CKTOTAL, CKMB, CKMBINDEX, TROPONINI in the last 72 hours. BNP No results for input(s): BNP in the last 72 hours. D-Dimer No results for input(s): DDIMER in the last 72 hours. Hemoglobin A1C No results for input(s): HGBA1C in the last 72 hours. Fasting Lipid Panel No results for input(s): CHOL, HDL, LDLCALC, TRIG, CHOLHDL, LDLDIRECT in  the last 72 hours. Thyroid Function Tests No results for input(s): TSH, T4TOTAL, T3FREE, THYROIDAB in the last 72 hours.  Invalid input(s): FREET3  Telemetry    Normal sinus rhythm  ECG    Normal sinus rhythm with nonspecific ST-T wave changes.  Radiology    DG Chest 2 View  Result Date: 12/26/2020 CLINICAL DATA:  Shortness of breath EXAM: CHEST - 2 VIEW COMPARISON:  February 12, 2017, September 01, 2020 FINDINGS: The cardiomediastinal silhouette is unchanged in contour.Atherosclerotic calcifications. No pleural effusion. No pneumothorax. No acute pleuroparenchymal abnormality. Visualized abdomen is unremarkable. Multilevel degenerative changes of the thoracic spine. IMPRESSION: No acute cardiopulmonary abnormality. Electronically Signed   By: Valentino Saxon MD   On: 12/26/2020 13:53   CT Angio Abd/Pel W and/or Wo Contrast  Result Date: 12/26/2020 CLINICAL DATA:  75 year old female with rectal bleeding EXAM: CTA ABDOMEN AND PELVIS WITHOUT AND WITH CONTRAST TECHNIQUE: Multidetector CT imaging of the abdomen and pelvis was performed using the standard protocol during bolus administration of intravenous contrast. Multiplanar reconstructed images and MIPs were obtained and reviewed to evaluate the vascular anatomy. CONTRAST:  62mL OMNIPAQUE IOHEXOL 350 MG/ML SOLN COMPARISON:  CT abdomen/pelvis 09/01/2020 FINDINGS: VASCULAR Aorta: Normal caliber aorta without aneurysm, dissection, vasculitis or significant stenosis. Scattered atherosclerotic vascular plaque. Celiac: Patent without evidence of aneurysm, dissection or significant stenosis gas occlusion. Variant anatomy. The celiac axis proper gives rise to the splenic and common hepatic artery. The right inferior phrenic and left gastric artery have a separate origin from the aorta. The lateral segmental branch of the left hepatic artery is replaced to the left gastric artery. SMA: Patent without evidence of aneurysm, dissection, vasculitis or  significant stenosis. Renals: Solitary renal arteries bilaterally. The origins are widely patent. Beading in the mid aspect of the right renal artery most consistent with changes of fibromuscular dysplasia. No definite changes within the left renal artery. IMA: Patent without evidence of aneurysm, dissection, vasculitis or significant stenosis. Inflow: Patent without evidence of aneurysm, dissection, vasculitis or significant stenosis. Proximal Outflow: Bilateral common femoral and visualized portions of the superficial and profunda femoral arteries are patent without evidence of aneurysm, dissection, vasculitis or significant stenosis. Veins: No focal venous abnormality. Review of the MIP images confirms the above findings. NON-VASCULAR Lower chest: No acute abnormality. Hepatobiliary: Geographic hypoattenuation in the left hemi-liver adjacent to  the fissure for the falciform ligament is nonspecific but most suggestive of benign focal fatty infiltration. Additionally, the hepatic parenchyma overall is low in attenuation with relative sparing adjacent to the gallbladder fossa consistent with a lesser degree of diffuse focal fatty infiltration. Small benign cyst versus hamartoma in the posterior aspect of the right hepatic dome. Otherwise, no discrete lesion. Gallbladder is unremarkable. No intra or extrahepatic biliary ductal dilatation. Pancreas: Unremarkable. No pancreatic ductal dilatation or surrounding inflammatory changes. Spleen: Normal in size without focal abnormality. Adrenals/Urinary Tract: Normal adrenal glands. No hydronephrosis, nephrolithiasis or enhancing renal aspirated innumerable bilateral low-attenuation renal lesions almost all of which are too small for accurate characterization. Statistically, these are highly likely benign cysts. The ureters and bladder are unremarkable. Stomach/Bowel: Advanced pan colonic diverticulosis. No evidence of active inflammation to suggest diverticulitis. No  evidence of contrast extravasation to suggest active arterial bleeding. Normal appendix in the right lower quadrant. No evidence of bowel obstruction. Unremarkable stomach and small bowel. Lymphatic: No suspicious lymphadenopathy. Reproductive: Uterus and bilateral adnexa are unremarkable. Other: No evidence of ascites. Small fat containing umbilical hernia. Musculoskeletal: No acute osseous abnormality. Multilevel degenerative disc disease. IMPRESSION: 1. Negative for evidence of active GI bleed at this time. 2. Advanced pan colonic diverticulosis. No evidence of active diverticulitis. 3. Hepatic steatosis. 4. Right mid renal artery fibromuscular dysplasia. No evidence of dissection or aneurysm. 5.  Aortic Atherosclerosis (ICD10-170.0) 6. Numerous small low-attenuation lesions scattered throughout both kidneys which are too small for accurate characterization. Statistically, these are highly likely benign cysts. 7. Small fat containing umbilical hernia. Electronically Signed   By: Jacqulynn Cadet M.D.   On: 12/26/2020 13:52    Assessment & Plan    75 year old female with history of diverticulitis, hypertension who was admitted after developing bright blood per rectum.  She had a normal hemoglobin but had evidence of volume depletion on presentation.  Her EKG on presentation was normal.  Hemoglobin dropped to 8.5 with a creatinine of 0.95.  No obvious bleeding.  She had T wave inversions on the lateral leads on EKG during episode of back pain shortness of breath and extreme anxiety.  Troponins were negative x3.  She currently is pain-free and stable.  At this point that she does not appear to be having an active ischemic event.  Would proceed with colonoscopy as planned without further cardiac work-up.  She appears optimized from a cardiac standpoint for the procedure.  Signed, Javier Docker Jas Betten MD 12/28/2020, 8:13 AM  Pager: [phone]

## 2020-12-29 ENCOUNTER — Encounter: Payer: Self-pay | Admitting: Gastroenterology

## 2020-12-29 DIAGNOSIS — K922 Gastrointestinal hemorrhage, unspecified: Secondary | ICD-10-CM | POA: Diagnosis not present

## 2020-12-29 DIAGNOSIS — D62 Acute posthemorrhagic anemia: Secondary | ICD-10-CM | POA: Diagnosis not present

## 2020-12-29 DIAGNOSIS — F419 Anxiety disorder, unspecified: Secondary | ICD-10-CM | POA: Diagnosis not present

## 2020-12-29 DIAGNOSIS — R531 Weakness: Secondary | ICD-10-CM | POA: Diagnosis not present

## 2020-12-29 LAB — GLUCOSE, CAPILLARY
Glucose-Capillary: 108 mg/dL — ABNORMAL HIGH (ref 70–99)
Glucose-Capillary: 121 mg/dL — ABNORMAL HIGH (ref 70–99)
Glucose-Capillary: 107 mg/dL — ABNORMAL HIGH (ref 70–99)
Glucose-Capillary: 116 mg/dL — ABNORMAL HIGH (ref 70–99)

## 2020-12-29 LAB — TYPE AND SCREEN
ABO/RH(D): O POS
Antibody Screen: NEGATIVE
Unit division: 0

## 2020-12-29 LAB — BPAM RBC
Blood Product Expiration Date: 202207072359
ISSUE DATE / TIME: 202206131905
Unit Type and Rh: 5100

## 2020-12-29 LAB — HEMOGLOBIN: Hemoglobin: 9.1 g/dL — ABNORMAL LOW (ref 12.0–15.0)

## 2020-12-29 LAB — VITAMIN B12: Vitamin B-12: 823 pg/mL (ref 180–914)

## 2020-12-29 NOTE — Progress Notes (Signed)
Patient Name: Jackie Hunter Date of Encounter: 12/29/2020  Hospital Problem List     Active Problems:   Anxiety and depression   GI bleed   Lower GI bleed   Weakness   Impaired fasting glucose   B12 deficiency   Acute blood loss anemia   Type 2 diabetes mellitus without complication, without long-term current use of insulin (HCC)   CKD (chronic kidney disease), stage II    Patient Profile     75 year old female with history of chronic constipation, diverticulitis, hypertension who was admitted with 3-day history of dizziness and bright red blood per rectum.  CTA of the abdomen pelvis with and without contrast showed no active bleeding however extensive diverticulosis.  Hemoglobin was 13.  She was admitted.  EKG on admission showed sinus tachycardia at 101 bpm but otherwise negative.  Hemoglobin on admission was 13 today is 8.5.  Renal function was mildly reduced on admission with a creatinine of 1.32.  Today 0.95.  Patient apparently was noted to have T wave inversions in the inferior leads on telemetry.  Troponin was drawn which was normal.  Patient was asymptomatic with regard to the chest pain.  Patient was started on Toprol.  Patient had shaking and epigastric discomfort yesterday afternoon.  Subjective   No complaints  Inpatient Medications     acetaminophen  650 mg Oral Once   buPROPion  150 mg Oral Daily   escitalopram  10 mg Oral Daily   furosemide  20 mg Intravenous Once   gabapentin  300 mg Oral TID   insulin aspart  0-5 Units Subcutaneous QHS   insulin aspart  0-9 Units Subcutaneous TID WC   metoprolol succinate  12.5 mg Oral Daily   pantoprazole (PROTONIX) IV  40 mg Intravenous Q12H   vitamin B-12  1,000 mcg Oral Daily    Vital Signs    Vitals:   12/28/20 2309 12/29/20 0603 12/29/20 0823 12/29/20 1139  BP: 124/66 (!) 112/57 (!) 145/64 125/66  Pulse: 65 70 88 66  Resp:  17 18 14   Temp: 97.8 F (36.6 C) 98.2 F (36.8 C) 97.8 F (36.6 C) 97.9 F (36.6  C)  TempSrc: Oral Oral    SpO2: 94% 100% 100% 100%  Weight:      Height:        Intake/Output Summary (Last 24 hours) at 12/29/2020 1328 Last data filed at 12/29/2020 1028 Gross per 24 hour  Intake 1105 ml  Output 0 ml  Net 1105 ml   Filed Weights   12/26/20 0944 12/28/20 1544  Weight: 83.9 kg 83.9 kg    Physical Exam    GEN: Well nourished, well developed, in no acute distress.  HEENT: normal.  Neck: Supple, no JVD, carotid bruits, or masses. Cardiac: RRR, no murmurs, rubs, or gallops. No clubbing, cyanosis, edema.  Radials/DP/PT 2+ and equal bilaterally.  Respiratory:  Respirations regular and unlabored, clear to auscultation bilaterally. GI: Soft, nontender, nondistended, BS + x 4. MS: no deformity or atrophy. Skin: warm and dry, no rash. Neuro:  Strength and sensation are intact. Psych: Normal affect.  Labs    CBC Recent Labs    12/27/20 0226 12/27/20 0822 12/27/20 1524 12/28/20 1431 12/29/20 0325  WBC 8.7  --   --   --   --   HGB 10.9* 11.0*   < > 7.5* 9.1*  HCT 32.7* 32.1*  --   --   --   MCV 90.1  --   --   --   --  PLT 345  --   --   --   --    < > = values in this interval not displayed.   Basic Metabolic Panel Recent Labs    12/27/20 0226 12/28/20 0641  NA 134* 137  K 4.0 3.7  CL 101 106  CO2 27 27  GLUCOSE 140* 119*  BUN 23 13  CREATININE 1.01* 0.95  CALCIUM 8.6* 7.9*  MG 2.2  --   PHOS 3.8  --    Liver Function Tests Recent Labs    12/27/20 0226  AST 24  ALT 16  ALKPHOS 47  BILITOT 0.7  PROT 6.3*  ALBUMIN 3.5   No results for input(s): LIPASE, AMYLASE in the last 72 hours. Cardiac Enzymes No results for input(s): CKTOTAL, CKMB, CKMBINDEX, TROPONINI in the last 72 hours. BNP No results for input(s): BNP in the last 72 hours. D-Dimer No results for input(s): DDIMER in the last 72 hours. Hemoglobin A1C Recent Labs    12/27/20 0226  HGBA1C 6.6*   Fasting Lipid Panel No results for input(s): CHOL, HDL, LDLCALC, TRIG,  CHOLHDL, LDLDIRECT in the last 72 hours. Thyroid Function Tests No results for input(s): TSH, T4TOTAL, T3FREE, THYROIDAB in the last 72 hours.  Invalid input(s): FREET3  Telemetry    nsr  ECG    nsr  Radiology    DG Chest 2 View  Result Date: 12/26/2020 CLINICAL DATA:  Shortness of breath EXAM: CHEST - 2 VIEW COMPARISON:  February 12, 2017, September 01, 2020 FINDINGS: The cardiomediastinal silhouette is unchanged in contour.Atherosclerotic calcifications. No pleural effusion. No pneumothorax. No acute pleuroparenchymal abnormality. Visualized abdomen is unremarkable. Multilevel degenerative changes of the thoracic spine. IMPRESSION: No acute cardiopulmonary abnormality. Electronically Signed   By: Valentino Saxon MD   On: 12/26/2020 13:53   CT Angio Abd/Pel W and/or Wo Contrast  Result Date: 12/26/2020 CLINICAL DATA:  75 year old female with rectal bleeding EXAM: CTA ABDOMEN AND PELVIS WITHOUT AND WITH CONTRAST TECHNIQUE: Multidetector CT imaging of the abdomen and pelvis was performed using the standard protocol during bolus administration of intravenous contrast. Multiplanar reconstructed images and MIPs were obtained and reviewed to evaluate the vascular anatomy. CONTRAST:  21mL OMNIPAQUE IOHEXOL 350 MG/ML SOLN COMPARISON:  CT abdomen/pelvis 09/01/2020 FINDINGS: VASCULAR Aorta: Normal caliber aorta without aneurysm, dissection, vasculitis or significant stenosis. Scattered atherosclerotic vascular plaque. Celiac: Patent without evidence of aneurysm, dissection or significant stenosis gas occlusion. Variant anatomy. The celiac axis proper gives rise to the splenic and common hepatic artery. The right inferior phrenic and left gastric artery have a separate origin from the aorta. The lateral segmental branch of the left hepatic artery is replaced to the left gastric artery. SMA: Patent without evidence of aneurysm, dissection, vasculitis or significant stenosis. Renals: Solitary renal arteries  bilaterally. The origins are widely patent. Beading in the mid aspect of the right renal artery most consistent with changes of fibromuscular dysplasia. No definite changes within the left renal artery. IMA: Patent without evidence of aneurysm, dissection, vasculitis or significant stenosis. Inflow: Patent without evidence of aneurysm, dissection, vasculitis or significant stenosis. Proximal Outflow: Bilateral common femoral and visualized portions of the superficial and profunda femoral arteries are patent without evidence of aneurysm, dissection, vasculitis or significant stenosis. Veins: No focal venous abnormality. Review of the MIP images confirms the above findings. NON-VASCULAR Lower chest: No acute abnormality. Hepatobiliary: Geographic hypoattenuation in the left hemi-liver adjacent to the fissure for the falciform ligament is nonspecific but most suggestive of benign focal fatty  infiltration. Additionally, the hepatic parenchyma overall is low in attenuation with relative sparing adjacent to the gallbladder fossa consistent with a lesser degree of diffuse focal fatty infiltration. Small benign cyst versus hamartoma in the posterior aspect of the right hepatic dome. Otherwise, no discrete lesion. Gallbladder is unremarkable. No intra or extrahepatic biliary ductal dilatation. Pancreas: Unremarkable. No pancreatic ductal dilatation or surrounding inflammatory changes. Spleen: Normal in size without focal abnormality. Adrenals/Urinary Tract: Normal adrenal glands. No hydronephrosis, nephrolithiasis or enhancing renal aspirated innumerable bilateral low-attenuation renal lesions almost all of which are too small for accurate characterization. Statistically, these are highly likely benign cysts. The ureters and bladder are unremarkable. Stomach/Bowel: Advanced pan colonic diverticulosis. No evidence of active inflammation to suggest diverticulitis. No evidence of contrast extravasation to suggest active  arterial bleeding. Normal appendix in the right lower quadrant. No evidence of bowel obstruction. Unremarkable stomach and small bowel. Lymphatic: No suspicious lymphadenopathy. Reproductive: Uterus and bilateral adnexa are unremarkable. Other: No evidence of ascites. Small fat containing umbilical hernia. Musculoskeletal: No acute osseous abnormality. Multilevel degenerative disc disease. IMPRESSION: 1. Negative for evidence of active GI bleed at this time. 2. Advanced pan colonic diverticulosis. No evidence of active diverticulitis. 3. Hepatic steatosis. 4. Right mid renal artery fibromuscular dysplasia. No evidence of dissection or aneurysm. 5.  Aortic Atherosclerosis (ICD10-170.0) 6. Numerous small low-attenuation lesions scattered throughout both kidneys which are too small for accurate characterization. Statistically, these are highly likely benign cysts. 7. Small fat containing umbilical hernia. Electronically Signed   By: Jacqulynn Cadet M.D.   On: 12/26/2020 13:52    Assessment & Plan      75 year old female with history of diverticulitis, hypertension who was admitted after developing bright blood per rectum.  She had a normal hemoglobin but had evidence of volume depletion on presentation.  Her EKG on presentation was normal.  Hemoglobin dropped to 8.5 with a creatinine of 0.95.  No obvious bleeding.  She had T wave inversions on the lateral leads on EKG during episode of back pain shortness of breath and extreme anxiety.  Troponins were negative x3.  She currently is pain-free and stable.  At this point that she does not appear to be having an active ischemic event.  Stable from a cardiac standpoint.  Signed, Javier Docker Fleet Higham MD 12/29/2020, 1:28 PM  Pager: [phone]

## 2020-12-29 NOTE — Progress Notes (Signed)
Physical Therapy Treatment Patient Details Name: Jackie Hunter MRN: [id number] DOB: [birth year] Today's Date: 12/29/2020    History of Present Illness Pt is a 75 y/o F who was admitted on 12/26/20 after presenting to ED with c/o 3 days of bright red blood per rectum, dizziness, global weakness & poor oral intake. PMH: chronic constipation, diverticulitis 2019, GERD, chronic anxiety/depression, polyneuropathy, essential HTN, back pain, CTS, cervicalgia, CKD stage 3, lumbar DDD, hiatal hernia, lumbago, stroke, TIA, obesity    PT Comments    Pt was pleasant and motivated to participate during the session. Pt gave great effort throughout session. Pt SPO2 and HR WNL on room air. Pt required no physical assistance with ambulation or function mobility but required min-mod verbal cueing on sequencing and walker placement especially with turning while ambulating with RW . Pt ambulated 81ft without  AD demonstrating drifting to right/left which was corrected using RW ambulating another 171ft. Pt was steady on ascending/descending stairs forward with min guard assistance.  Pt has a grandson and niece in the area that can stay with her intermittently throughout the day post d/c. Pt will benefit from HHPT upon discharge to safely address deficits listed in patient problem list for decreased caregiver assistance and eventual return to PLOF.   Follow Up Recommendations  Home health PT;Supervision - Intermittent     Equipment Recommendations  Rolling walker with 5" wheels    Recommendations for Other Services       Precautions / Restrictions Precautions Precautions: Fall Restrictions Weight Bearing Restrictions: No    Mobility  Bed Mobility Overal bed mobility: Independent                  Transfers Overall transfer level: Needs assistance Equipment used: None Transfers: Sit to/from Stand Sit to Stand: SBA            Ambulation/Gait Ambulation/Gait assistance: SBA Gait Distance  (Feet): 150 Feet Assistive device: Rolling walker (2 wheeled);None Gait Pattern/deviations: Step-through pattern;Drifts right/left Gait velocity: decreased   General Gait Details: Pt drifted to right/left intermittently without RW. Pt steady using RW requring verbal cues.   Stairs Stairs: Yes Stairs assistance: SBA Stair Management: One rail Left Number of Stairs: 4     Wheelchair Mobility    Modified Rankin (Stroke Patients Only)       Balance Overall balance assessment: Independent Sitting-balance support: Feet supported;Feet unsupported Sitting balance-Leahy Scale: Good     Standing balance support: No upper extremity supported;Bilateral upper extremity supported Standing balance-Leahy Scale: Fair Standing balance comment: Fair without AD; Good with RW                            Cognition Arousal/Alertness: Awake/alert Behavior During Therapy: WFL for tasks assessed/performed Overall Cognitive Status: Within Functional Limits for tasks assessed                                        Exercises General Exercises - Lower Extremity Ankle Circles/Pumps: AROM;Both;10 reps;Strengthening;Supine Quad Sets: AROM;Strengthening;Both;10 reps;Supine Gluteal Sets: Strengthening;Both;10 reps;Supine Long Arc Quad: AROM;Strengthening;Both;10 reps;Seated Hip ABduction/ADduction: AROM;Strengthening;Both;5 reps;Supine Hip Flexion/Marching: AROM;Both;10 reps;Standing Other Exercises Other Exercises: Pt educated on turning using walker for sequencing and placement of walker verbally and via demonstration with understanding demonstrated by pt.    General Comments        Pertinent Vitals/Pain Pain Assessment: No/denies pain  Home Living                      Prior Function            PT Goals (current goals can now be found in the care plan section) Progress towards PT goals: Progressing toward goals    Frequency    Min  2X/week      PT Plan Discharge plan needs to be updated    Co-evaluation              AM-PAC PT "6 Clicks" Mobility   Outcome Measure  Help needed turning from your back to your side while in a flat bed without using bedrails?: None Help needed moving from lying on your back to sitting on the side of a flat bed without using bedrails?: None Help needed moving to and from a bed to a chair (including a wheelchair)?: A Little Help needed standing up from a chair using your arms (e.g., wheelchair or bedside chair)?: None Help needed to walk in hospital room?: A Little Help needed climbing 3-5 steps with a railing? : A Little 6 Click Score: 21    End of Session Equipment Utilized During Treatment: Gait belt Activity Tolerance: Patient tolerated treatment well Patient left: in chair;with call bell/phone within reach;with family/visitor present;Other (comment) (Nurse notified of chair alarm not working) Nurse Communication: Mobility status PT Visit Diagnosis: Muscle weakness (generalized) (M62.81);Unsteadiness on feet (R26.81)     Time: 7510-2585 PT Time Calculation (min) (ACUTE ONLY): 25 min  Charges:                        Dayton Scrape SPT 12/29/20, 5:45 PM

## 2020-12-29 NOTE — Progress Notes (Signed)
Patient ID: Jackie Hunter, female   DOB: May 09, 1946, 75 y.o.   MRN: [id number] [facility] PROGRESS NOTE  AIZLYN SCHIFANO VOJ:[id number] DOB: Aug 28, 1945 DOA: 12/26/2020 PCP: Tracie Harrier, MD  HPI/Subjective: Patient feeling better today after blood transfusion yesterday.  Colonoscopy showing blood throughout the colon.  Has not had a bowel movement since colonoscopy.  No complaints of chest pain or shortness of breath.  Objective: Vitals:   12/29/20 1139 12/29/20 1537  BP: 125/66 115/69  Pulse: 66 70  Resp: 14 17  Temp: 97.9 F (36.6 C) 98.2 F (36.8 C)  SpO2: 100% 100%    Intake/Output Summary (Last 24 hours) at 12/29/2020 1538 Last data filed at 12/29/2020 1439 Gross per 24 hour  Intake 1345 ml  Output 0 ml  Net 1345 ml   Filed Weights   12/26/20 0944 12/28/20 1544  Weight: 83.9 kg 83.9 kg    ROS: Review of Systems  Respiratory:  Negative for shortness of breath.   Cardiovascular:  Negative for chest pain.  Gastrointestinal:  Negative for abdominal pain, nausea and vomiting.  Exam: Physical Exam HENT:     Head: Normocephalic.     Mouth/Throat:     Pharynx: No oropharyngeal exudate.  Eyes:     General: Lids are normal.     Conjunctiva/sclera: Conjunctivae normal.     Pupils: Pupils are equal, round, and reactive to light.  Cardiovascular:     Rate and Rhythm: Normal rate and regular rhythm.     Heart sounds: Normal heart sounds, S1 normal and S2 normal.  Pulmonary:     Breath sounds: Normal breath sounds. No decreased breath sounds, wheezing, rhonchi or rales.  Abdominal:     Palpations: Abdomen is soft.     Tenderness: There is no abdominal tenderness.  Musculoskeletal:     Right lower leg: No swelling.     Left lower leg: No swelling.  Skin:    General: Skin is warm.     Findings: No rash.  Neurological:     Mental Status: She is alert and oriented to person, place, and time.     Data Reviewed: Basic Metabolic Panel: Recent Labs  Lab  12/26/20 0952 12/27/20 0226 12/28/20 0641  NA 133* 134* 137  K 3.5 4.0 3.7  CL 99 101 106  CO2 22 27 27   GLUCOSE 160* 140* 119*  BUN 28* 23 13  CREATININE 1.18* 1.01* 0.95  CALCIUM 9.0 8.6* 7.9*  MG  --  2.2  --   PHOS  --  3.8  --    Liver Function Tests: Recent Labs  Lab 12/26/20 0952 12/27/20 0226  AST 27 24  ALT 18 16  ALKPHOS 56 47  BILITOT 0.8 0.7  PROT 7.7 6.3*  ALBUMIN 4.1 3.5    CBC: Recent Labs  Lab 12/26/20 0952 12/26/20 1257 12/26/20 2015 12/27/20 0226 12/27/20 0822 12/27/20 1524 12/28/20 0641 12/28/20 1431 12/29/20 0325  WBC 7.3  --   --  8.7  --   --   --   --   --   HGB 13.0 12.9 12.2 10.9* 11.0* 10.2* 8.5* 7.5* 9.1*  HCT 38.0 38.5 35.9* 32.7* 32.1*  --   --   --   --   MCV 88.6  --   --  90.1  --   --   --   --   --   PLT 381  --   --  345  --   --   --   --   --  CBG: Recent Labs  Lab 12/28/20 0746 12/28/20 1154 12/28/20 2054 12/29/20 0824 12/29/20 1140  GLUCAP 117* 118* 140* 108* 121*    Recent Results (from the past 240 hour(s))  Resp Panel by RT-PCR (Flu A&B, Covid) Nasopharyngeal Swab     Status: None   Collection Time: 12/26/20 12:57 PM   Specimen: Nasopharyngeal Swab; Nasopharyngeal(NP) swabs in vial transport medium  Result Value Ref Range Status   SARS Coronavirus 2 by RT PCR NEGATIVE NEGATIVE Final    Comment: (NOTE) SARS-CoV-2 target nucleic acids are NOT DETECTED.  The SARS-CoV-2 RNA is generally detectable in upper respiratory specimens during the acute phase of infection. The lowest concentration of SARS-CoV-2 viral copies this assay can detect is 138 copies/mL. A negative result does not preclude SARS-Cov-2 infection and should not be used as the sole basis for treatment or other patient management decisions. A negative result may occur with  improper specimen collection/handling, submission of specimen other than nasopharyngeal swab, presence of viral mutation(s) within the areas targeted by this assay,  and inadequate number of viral copies(<138 copies/mL). A negative result must be combined with clinical observations, patient history, and epidemiological information. The expected result is Negative.  Fact Sheet for Patients:  EntrepreneurPulse.com.au  Fact Sheet for Healthcare Providers:  IncredibleEmployment.be  This test is no t yet approved or cleared by the [place] FDA and  has been authorized for detection and/or diagnosis of SARS-CoV-2 by FDA under an Emergency Use Authorization (EUA). This EUA will remain  in effect (meaning this test can be used) for the duration of the COVID-19 declaration under Section 564(b)(1) of the Act, 21 U.S.C.section 360bbb-3(b)(1), unless the authorization is terminated  or revoked sooner.       Influenza A by PCR NEGATIVE NEGATIVE Final   Influenza B by PCR NEGATIVE NEGATIVE Final    Comment: (NOTE) The Xpert Xpress SARS-CoV-2/FLU/RSV plus assay is intended as an aid in the diagnosis of influenza from Nasopharyngeal swab specimens and should not be used as a sole basis for treatment. Nasal washings and aspirates are unacceptable for Xpert Xpress SARS-CoV-2/FLU/RSV testing.  Fact Sheet for Patients: EntrepreneurPulse.com.au  Fact Sheet for Healthcare Providers: IncredibleEmployment.be  This test is not yet approved or cleared by the [place] FDA and has been authorized for detection and/or diagnosis of SARS-CoV-2 by FDA under an Emergency Use Authorization (EUA). This EUA will remain in effect (meaning this test can be used) for the duration of the COVID-19 declaration under Section 564(b)(1) of the Act, 21 U.S.C. section 360bbb-3(b)(1), unless the authorization is terminated or revoked.  Performed at [facility], [address] [place], [place] [address]        Scheduled Meds:  acetaminophen  650 mg Oral Once   buPROPion  150 mg Oral  Daily   escitalopram  10 mg Oral Daily   furosemide  20 mg Intravenous Once   gabapentin  300 mg Oral TID   insulin aspart  0-5 Units Subcutaneous QHS   insulin aspart  0-9 Units Subcutaneous TID WC   metoprolol succinate  12.5 mg Oral Daily   pantoprazole (PROTONIX) IV  40 mg Intravenous Q12H   vitamin B-12  1,000 mcg Oral Daily   Brief history.  Patient admitted 12/26/2020 with lower GI bleed and dizziness.  Hemoglobin was 13 on admission and dropped down to 7.5 on 12/28/2020.  Patient was given 1 unit of packed red blood cells with good response with hemoglobin up to 9.1.  Initial CT  angio of the abdomen pelvis did not show any active GI bleed, the patient did have pan colonic diverticulosis, hepatic steatosis and renal artery fibromuscular dysplasia, benign kidney cysts.  Colonoscopy was done on 12/28/2020 that showed blood throughout the entire colon.  Assessment/Plan:  Acute blood loss anemia with lower GI bleed.  Hemoglobin was 13 on presentation down to 7.5 yesterday afternoon.  1 unit of packed red blood cells was given and hemoglobin came up to 9.1.  We will give IV iron this morning.  Patient does feel better after blood transfusion.  Colonoscopy did show blood throughout the entire colon.  Case discussed with GI and okay to advance diet.  If any further hemorrhage can consider a bleeding scan.  Would like to see the next bowel movement before deciding on disposition. Weakness.  Physical therapy recommends rehab.  Patient would like to go home.  We will see how she does today with physical therapy. Anxiety depression on Lexapro and Wellbutrin Type 2 diabetes mellitus with hemoglobin A1c of 6.6.  Diet controlled. Chronic kidney disease stage II.  Creatinine 0.95. Vitamin B-12 deficiency on oral vitamin B12.  B12 level normal range. Panic attack 2 days ago given a dose of Xanax which seemed to help. Essential hypertension and new flipped T waves.  I started low-dose Toprol.  Cardiac  enzymes negative.  Had cardiology see the patient to cleared for.  Follow-up with cardiology as outpatient.        Code Status:     Code Status Orders  (From admission, onward)           Start     Ordered   12/26/20 1436  Full code  Continuous        12/26/20 1435           Code Status History     Date Active Date Inactive Code Status Order ID Comments User Context   12/26/2020 1434 12/26/2020 1435 Full Code [id number]  Kayleen Memos, DO ED      Family Communication: Spoke with daughter on the phone Disposition Plan: Status is: Inpatient  Dispo:  Patient From: Home  Planned Disposition: Home, potentially tomorrow if no further bleeding or drop in hemoglobin  Medically stable for discharge: No  Consultants: Gastroenterology  Procedures: Colonoscopy  Time spent: 27 minutes, case discussed with gastroenterology  Loletha Grayer  [facility]

## 2020-12-29 NOTE — TOC Progression Note (Signed)
Transition of Care [facility]) - Progression Note    Patient Details  Name: Jackie Hunter MRN: [id number] Date of Birth: 14-Sep-1945  Transition of [facility]) CM/SW [place], RN Phone Number: 12/29/2020, 9:53 AM  Clinical Narrative:   Patient lives alone , but son is visiting from [place] and will be here for 1 week to assist patient.  When son leaves, niece can assist patient.   She plans to go home rather than SNF.  Bed Search discontinued.   Patient has no concerns with getting to appointments or getting medications.  Patient reports that she takes her medications as appropriate and has no concerns.  Patient does not use any home health services at present, and does not have DME at home.  Declines further TOC assistance at this time.         Expected Discharge Plan and Services                                                 Social Determinants of Health (SDOH) Interventions    Readmission Risk Interventions No flowsheet data found.

## 2020-12-30 DIAGNOSIS — D62 Acute posthemorrhagic anemia: Secondary | ICD-10-CM | POA: Diagnosis not present

## 2020-12-30 DIAGNOSIS — K922 Gastrointestinal hemorrhage, unspecified: Secondary | ICD-10-CM | POA: Diagnosis not present

## 2020-12-30 DIAGNOSIS — E538 Deficiency of other specified B group vitamins: Secondary | ICD-10-CM | POA: Diagnosis not present

## 2020-12-30 DIAGNOSIS — F419 Anxiety disorder, unspecified: Secondary | ICD-10-CM | POA: Diagnosis not present

## 2020-12-30 LAB — GLUCOSE, CAPILLARY
Glucose-Capillary: 91 mg/dL (ref 70–99)
Glucose-Capillary: 111 mg/dL — ABNORMAL HIGH (ref 70–99)
Glucose-Capillary: 114 mg/dL — ABNORMAL HIGH (ref 70–99)

## 2020-12-30 LAB — HEMOGLOBIN: Hemoglobin: 8.7 g/dL — ABNORMAL LOW (ref 12.0–15.0)

## 2020-12-30 MED ORDER — POLYETHYLENE GLYCOL 3350 17 G PO PACK
34.0000 g | PACK | Freq: Once | ORAL | Status: AC
  Administered 2020-12-30: 18:00:00 34 g via ORAL
  Filled 2020-12-30: qty 2

## 2020-12-30 MED ORDER — BISACODYL 5 MG PO TBEC
10.0000 mg | DELAYED_RELEASE_TABLET | Freq: Once | ORAL | Status: AC
  Administered 2020-12-31: 10 mg via ORAL
  Filled 2020-12-30: qty 2

## 2020-12-30 NOTE — Progress Notes (Signed)
I have reviewed and concur with the nursing student's assessment and documentation.  Sandria Senter, RN, Nursing Instructor

## 2020-12-30 NOTE — Progress Notes (Signed)
Patient ID: Jackie Hunter, female   DOB: 1946-06-11, 75 y.o.   MRN: [id number] [facility] PROGRESS NOTE  SHABNAM LADD HYQ:[id number] DOB: Jul 09, 1946 DOA: 12/26/2020 PCP: Tracie Harrier, MD  HPI/Subjective:   Brief history.  Patient admitted 12/26/2020 with lower GI bleed and dizziness.  Hemoglobin was 13 on admission and dropped down to 7.5 on 12/28/2020.  Patient was given 1 unit of packed red blood cells with good response with hemoglobin up to 9.1.  Initial CT angio of the abdomen pelvis did not show any active GI bleed, the patient did have pan colonic diverticulosis, hepatic steatosis and renal artery fibromuscular dysplasia, benign kidney cysts.  Colonoscopy was done on 12/28/2020 that showed blood throughout the entire colon. Objective: Vitals:   12/30/20 0827 12/30/20 1200  BP: (!) 120/56 (!) 103/49  Pulse: 61 67  Resp: 18 16  Temp: 98 F (36.7 C) 98 F (36.7 C)  SpO2: 98% 98%    Intake/Output Summary (Last 24 hours) at 12/30/2020 1214 Last data filed at 12/29/2020 1902 Gross per 24 hour  Intake 480 ml  Output --  Net 480 ml    Filed Weights   12/26/20 0944 12/28/20 1544  Weight: 83.9 kg 83.9 kg    ROS:  He denies any nausea, vomiting, reports no bowel movement yet since her colonoscopy, no fever or chills.  Exam:  Awake Alert, Oriented X 3, No new F.N deficits, Normal affect Symmetrical Chest wall movement, Good air movement bilaterally, CTAB RRR,No Gallops,Rubs or new Murmurs, No Parasternal Heave +ve B.Sounds, Abd Soft, No tenderness, No rebound - guarding or rigidity. No Cyanosis, Clubbing or edema, No new Rash or bruise      Data Reviewed: Basic Metabolic Panel: Recent Labs  Lab 12/26/20 0952 12/27/20 0226 12/28/20 0641  NA 133* 134* 137  K 3.5 4.0 3.7  CL 99 101 106  CO2 22 27 27   GLUCOSE 160* 140* 119*  BUN 28* 23 13  CREATININE 1.18* 1.01* 0.95  CALCIUM 9.0 8.6* 7.9*  MG  --  2.2  --   PHOS  --  3.8  --     Liver Function  Tests: Recent Labs  Lab 12/26/20 0952 12/27/20 0226  AST 27 24  ALT 18 16  ALKPHOS 56 47  BILITOT 0.8 0.7  PROT 7.7 6.3*  ALBUMIN 4.1 3.5     CBC: Recent Labs  Lab 12/26/20 0952 12/26/20 1257 12/26/20 2015 12/27/20 0226 12/27/20 0822 12/27/20 1524 12/28/20 0641 12/28/20 1431 12/29/20 0325 12/30/20 0503  WBC 7.3  --   --  8.7  --   --   --   --   --   --   HGB 13.0 12.9 12.2 10.9* 11.0* 10.2* 8.5* 7.5* 9.1* 8.7*  HCT 38.0 38.5 35.9* 32.7* 32.1*  --   --   --   --   --   MCV 88.6  --   --  90.1  --   --   --   --   --   --   PLT 381  --   --  345  --   --   --   --   --   --       CBG: Recent Labs  Lab 12/29/20 1140 12/29/20 1638 12/29/20 2047 12/30/20 0833 12/30/20 1159  GLUCAP 121* 107* 116* 91 111*     Recent Results (from the past 240 hour(s))  Resp Panel by RT-PCR (Flu A&B, Covid) Nasopharyngeal Swab  Status: None   Collection Time: 12/26/20 12:57 PM   Specimen: Nasopharyngeal Swab; Nasopharyngeal(NP) swabs in vial transport medium  Result Value Ref Range Status   SARS Coronavirus 2 by RT PCR NEGATIVE NEGATIVE Final    Comment: (NOTE) SARS-CoV-2 target nucleic acids are NOT DETECTED.  The SARS-CoV-2 RNA is generally detectable in upper respiratory specimens during the acute phase of infection. The lowest concentration of SARS-CoV-2 viral copies this assay can detect is 138 copies/mL. A negative result does not preclude SARS-Cov-2 infection and should not be used as the sole basis for treatment or other patient management decisions. A negative result may occur with  improper specimen collection/handling, submission of specimen other than nasopharyngeal swab, presence of viral mutation(s) within the areas targeted by this assay, and inadequate number of viral copies(<138 copies/mL). A negative result must be combined with clinical observations, patient history, and epidemiological information. The expected result is Negative.  Fact Sheet for  Patients:  EntrepreneurPulse.com.au  Fact Sheet for Healthcare Providers:  IncredibleEmployment.be  This test is no t yet approved or cleared by the [place] FDA and  has been authorized for detection and/or diagnosis of SARS-CoV-2 by FDA under an Emergency Use Authorization (EUA). This EUA will remain  in effect (meaning this test can be used) for the duration of the COVID-19 declaration under Section 564(b)(1) of the Act, 21 U.S.C.section 360bbb-3(b)(1), unless the authorization is terminated  or revoked sooner.       Influenza A by PCR NEGATIVE NEGATIVE Final   Influenza B by PCR NEGATIVE NEGATIVE Final    Comment: (NOTE) The Xpert Xpress SARS-CoV-2/FLU/RSV plus assay is intended as an aid in the diagnosis of influenza from Nasopharyngeal swab specimens and should not be used as a sole basis for treatment. Nasal washings and aspirates are unacceptable for Xpert Xpress SARS-CoV-2/FLU/RSV testing.  Fact Sheet for Patients: EntrepreneurPulse.com.au  Fact Sheet for Healthcare Providers: IncredibleEmployment.be  This test is not yet approved or cleared by the [place] FDA and has been authorized for detection and/or diagnosis of SARS-CoV-2 by FDA under an Emergency Use Authorization (EUA). This EUA will remain in effect (meaning this test can be used) for the duration of the COVID-19 declaration under Section 564(b)(1) of the Act, 21 U.S.C. section 360bbb-3(b)(1), unless the authorization is terminated or revoked.  Performed at [facility], [address] [place], [place] [address]         Scheduled Meds:  acetaminophen  650 mg Oral Once   buPROPion  150 mg Oral Daily   escitalopram  10 mg Oral Daily   furosemide  20 mg Intravenous Once   gabapentin  300 mg Oral TID   insulin aspart  0-5 Units Subcutaneous QHS   insulin aspart  0-9 Units Subcutaneous TID WC   metoprolol  succinate  12.5 mg Oral Daily   pantoprazole (PROTONIX) IV  40 mg Intravenous Q12H   vitamin B-12  1,000 mcg Oral Daily     Assessment/Plan:  Acute blood loss anemia with lower GI bleed.  Hemoglobin was 13 on presentation down to 7.4 yesterday afternoon.  1 unit of packed red blood cells was given and hemoglobin came up to 9.1.  It is down to 8.7 today, she did receive IV iron as well, this morning she is with no bowel movement yet.  Colonoscopy did show blood throughout the entire colon.  We will wait over next 24 hours, if no further evidence of GI bleed then she can be discharged home,  but if she has an evidence of GI bleed, then we will need to proceed with bleeding scan.   Weakness.  Physical therapy recommends rehab.  Patient would like to go home.   Anxiety depression on Lexapro and Wellbutrin Type 2 diabetes mellitus with hemoglobin A1c of 6.6.  Diet controlled. Chronic kidney disease stage II.  Creatinine 0.95. Vitamin B-12 deficiency on oral vitamin B12.  B12 level normal range. Panic attack 2 days ago given a dose of Xanax which seemed to help. Essential hypertension and new flipped T waves.  I started low-dose Toprol.  Cardiac enzymes negative.  Had cardiology see the patient to cleared for.  Follow-up with cardiology as outpatient.        Code Status:     Code Status Orders  (From admission, onward)           Start     Ordered   12/26/20 1436  Full code  Continuous        12/26/20 1435           Code Status History     Date Active Date Inactive Code Status Order ID Comments User Context   12/26/2020 1434 12/26/2020 1435 Full Code [id number]  Kayleen Memos, DO ED      Family Communication: none at bedside Disposition Plan: Status is: Inpatient  Dispo:  Patient From: Home  Planned Disposition: Home with Health Care Svc,   Medically stable for discharge: No  Consultants: Gastroenterology  Procedures: Colonoscopy  [facility]  [facility]

## 2020-12-31 ENCOUNTER — Encounter: Payer: Self-pay | Admitting: Cardiology

## 2020-12-31 DIAGNOSIS — K5790 Diverticulosis of intestine, part unspecified, without perforation or abscess without bleeding: Secondary | ICD-10-CM | POA: Diagnosis not present

## 2020-12-31 DIAGNOSIS — K922 Gastrointestinal hemorrhage, unspecified: Secondary | ICD-10-CM | POA: Diagnosis not present

## 2020-12-31 DIAGNOSIS — D62 Acute posthemorrhagic anemia: Secondary | ICD-10-CM | POA: Diagnosis not present

## 2020-12-31 DIAGNOSIS — F419 Anxiety disorder, unspecified: Secondary | ICD-10-CM | POA: Diagnosis not present

## 2020-12-31 LAB — BASIC METABOLIC PANEL
Anion gap: 5 (ref 5–15)
BUN: 10 mg/dL (ref 8–23)
CO2: 25 mmol/L (ref 22–32)
Calcium: 8.4 mg/dL — ABNORMAL LOW (ref 8.9–10.3)
Chloride: 109 mmol/L (ref 98–111)
Creatinine, Ser: 1.1 mg/dL — ABNORMAL HIGH (ref 0.44–1.00)
GFR, Estimated: 52 mL/min — ABNORMAL LOW (ref >60)
Glucose, Bld: 139 mg/dL — ABNORMAL HIGH (ref 70–99)
Potassium: 3.4 mmol/L — ABNORMAL LOW (ref 3.5–5.1)
Sodium: 139 mmol/L (ref 135–145)

## 2020-12-31 LAB — GLUCOSE, CAPILLARY
Glucose-Capillary: 147 mg/dL — ABNORMAL HIGH (ref 70–99)
Glucose-Capillary: 87 mg/dL (ref 70–99)
Glucose-Capillary: 88 mg/dL (ref 70–99)

## 2020-12-31 LAB — CBC
HCT: 25.8 % — ABNORMAL LOW (ref 36.0–46.0)
Hemoglobin: 8.7 g/dL — ABNORMAL LOW (ref 12.0–15.0)
MCH: 30.9 pg (ref 26.0–34.0)
MCHC: 33.7 g/dL (ref 30.0–36.0)
MCV: 91.5 fL (ref 80.0–100.0)
Platelets: 330 10*3/uL (ref 150–400)
RBC: 2.82 MIL/uL — ABNORMAL LOW (ref 3.87–5.11)
RDW: 14.6 % (ref 11.5–15.5)
WBC: 8.4 10*3/uL (ref 4.0–10.5)
nRBC: 0.6 % — ABNORMAL HIGH (ref 0.0–0.2)

## 2020-12-31 MED ORDER — ACETAMINOPHEN 325 MG PO TABS: 650.0000 mg | ORAL_TABLET | Freq: Four times a day (QID) | ORAL | Status: AC | PRN

## 2020-12-31 MED ORDER — POTASSIUM CHLORIDE CRYS ER 20 MEQ PO TBCR
40.0000 meq | EXTENDED_RELEASE_TABLET | Freq: Once | ORAL | Status: AC
  Administered 2020-12-31: 40 meq via ORAL
  Filled 2020-12-31: qty 2

## 2020-12-31 MED ORDER — PANTOPRAZOLE SODIUM 40 MG PO TBEC: 40.0000 mg | DELAYED_RELEASE_TABLET | Freq: Every day | ORAL | 1 refills | Status: AC

## 2020-12-31 MED ORDER — SODIUM CHLORIDE 0.9 % IV SOLN
300.0000 mg | Freq: Once | INTRAVENOUS | Status: AC
  Administered 2020-12-31: 13:00:00 300 mg via INTRAVENOUS
  Filled 2020-12-31: qty 15

## 2020-12-31 MED ORDER — POLYETHYLENE GLYCOL 3350 17 G PO PACK
34.0000 g | PACK | Freq: Once | ORAL | Status: AC
  Administered 2020-12-31: 10:00:00 34 g via ORAL
  Filled 2020-12-31: qty 2

## 2020-12-31 MED ORDER — BISACODYL 5 MG PO TBEC
10.0000 mg | DELAYED_RELEASE_TABLET | Freq: Once | ORAL | Status: AC
  Administered 2020-12-31: 10 mg via ORAL
  Filled 2020-12-31: qty 2

## 2020-12-31 NOTE — Care Management Important Message (Signed)
Important Message  Patient Details  Name: Jackie Hunter MRN: [id number] Date of Birth: 10-Nov-1945   Medicare Important Message Given:  Yes     [name] 12/31/2020, 2:52 PM

## 2020-12-31 NOTE — TOC Transition Note (Signed)
Transition of Care [facility]) - CM/SW Discharge Note   Patient Details  Name: Jackie Hunter MRN: [id number] Date of Birth: 1945-12-30  [facility]) CM/SW Contact:  Pete Pelt, RN Phone Number: 12/31/2020, 2:30 PM   Clinical Narrative:   Patient will be discharged home today after iron infusion.  Al[facility] Lemmings will accept patient for PT services.  No recommended DME per PT note.  Patient's son will be staying with her and then niece will assist with care as well.            Patient Goals and CMS Choice        Discharge Placement                       Discharge Plan and Services                                     Social Determinants of Health (SDOH) Interventions     Readmission Risk Interventions No flowsheet data found.

## 2020-12-31 NOTE — Discharge Summary (Signed)
Physician Discharge Summary  Jackie Hunter XNA:[id number] DOB: 1945-08-19 DOA: 12/26/2020  PCP: Tracie Harrier, MD  Admit date: 12/26/2020 Discharge date: 12/31/2020  Admitted From Home Disposition: Home   Recommendations for Outpatient Follow-up:  Follow up with PCP 1 week Follow-up with GI as an outpatient Please recheck A1c in 6 4 to 6 weeks as an outpatient, continue counseling about carb modified diet.  Home Health YES  Discharge Condition stable CODE STATUS: FULL Diet recommendation: Heart Healthy / Carb Modified   Brief/Interim Summary:  Brief history.  Patient admitted 12/26/2020 with lower GI bleed and dizziness.  Hemoglobin was 13 on admission and dropped down to 7.5 on 12/28/2020.  Patient was given 1 unit of packed red blood cells with good response with hemoglobin up to 9.1.  Initial CT angio of the abdomen pelvis did not show any active GI bleed, the patient did have pan colonic diverticulosis, hepatic steatosis and renal artery fibromuscular dysplasia, benign kidney cysts.  Colonoscopy was done on 12/28/2020 that showed blood throughout the entire colon.  But no evidence of active bleed, had lower GI bleed felt secondary to diverticular bleed.  Acute blood loss anemia with lower GI bleed.  Hemoglobin was 13 on presentation down to 7.4 1 unit PRBC, as well she did receive IV iron sucrose x2 spittle stay, colonoscopy did show blood throughout the entire colon.  There was no evidence of active bleeding, I have discussed with GI, source of bleeding most likely related to diverticular bleed, which does not appear to be active no more, her hemoglobin has been remained stable since colonoscopy over last 48 hours, she had no bowel movement since her colonoscopy, to follow with her primary GI as an outpatient, and to repeat CBC in 1 week. . Weakness.  Physical therapy recommends rehab.  Patient would like to go home.  PT arranged at home Anxiety depression on Lexapro and  Wellbutrin Type 2 diabetes mellitus with hemoglobin A1c of 6.6.  Diet controlled. Chronic kidney disease stage II.  Creatinine 0.95. Vitamin B-12 deficiency on oral vitamin B12.  B12 level normal range. Panic attack 2 days ago given a dose of Xanax which seemed to help. Essential hypertension and new flipped T waves.  I started low-dose Toprol.  Cardiac enzymes negative.  Had cardiology see the patient to cleared for.  Follow-up with cardiology as outpatient.  Her blood pressure overall soft, so her lisinopril/hydrochlorothiazide has been stopped now she is on low-dose metoprolol.    Discharge Diagnoses:  Active Problems:   Anxiety and depression   GI bleed   Lower GI bleed   Weakness   Impaired fasting glucose   B12 deficiency   Acute blood loss anemia   Type 2 diabetes mellitus without complication, without long-term current use of insulin (HCC)   CKD (chronic kidney disease), stage II    Discharge Instructions  Discharge Instructions     Diet - low sodium heart healthy   Complete by: As directed    Discharge instructions   Complete by: As directed    Follow with Primary MD Tracie Harrier, MD in 7 days   Get CBC, CMP,  checked  by Primary MD next visit.    Activity: As tolerated with Full fall precautions use walker/cane & assistance as needed   Disposition Home    Diet: Carb modified diet.   On your next visit with your primary care physician please Get Medicines reviewed and adjusted.   Please request your Prim.MD to go over  all Hospital Tests and Procedure/Radiological results at the follow up, please get all Hospital records sent to your Prim MD by signing hospital release before you go home.   If you experience worsening of your admission symptoms, develop shortness of breath, life threatening emergency, suicidal or homicidal thoughts you must seek medical attention immediately by calling 911 or calling your MD immediately  if symptoms less severe.  You  Must read complete instructions/literature along with all the possible adverse reactions/side effects for all the Medicines you take and that have been prescribed to you. Take any new Medicines after you have completely understood and accpet all the possible adverse reactions/side effects.   Do not drive, operating heavy machinery, perform activities at heights, swimming or participation in water activities or provide baby sitting services if your were admitted for syncope or siezures until you have seen by Primary MD or a Neurologist and advised to do so again.  Do not drive when taking Pain medications.    Do not take more than prescribed Pain, Sleep and Anxiety Medications  Special Instructions: If you have smoked or chewed Tobacco  in the last 2 yrs please stop smoking, stop any regular Alcohol  and or any Recreational drug use.  Wear Seat belts while driving.   Please note  You were cared for by a hospitalist during your hospital stay. If you have any questions about your discharge medications or the care you received while you were in the hospital after you are discharged, you can call the unit and asked to speak with the hospitalist on call if the hospitalist that took care of you is not available. Once you are discharged, your primary care physician will handle any further medical issues. Please note that NO REFILLS for any discharge medications will be authorized once you are discharged, as it is imperative that you return to your primary care physician (or establish a relationship with a primary care physician if you do not have one) for your aftercare needs so that they can reassess your need for medications and monitor your lab values.   Increase activity slowly   Complete by: As directed       Allergies as of 12/31/2020       Reactions   Levaquin [levofloxacin In D5w] Other (See Comments)   dizziness   Meloxicam    Codeine Anxiety   Sucralfate Nausea Only         Medication List     STOP taking these medications    ibuprofen 200 MG tablet Commonly known as: ADVIL   lisinopril-hydrochlorothiazide 10-12.5 MG tablet Commonly known as: ZESTORETIC   meloxicam 15 MG tablet Commonly known as: MOBIC   metoCLOPramide 10 MG tablet Commonly known as: REGLAN   metroNIDAZOLE 250 MG tablet Commonly known as: FLAGYL   NexIUM 40 MG capsule Generic drug: esomeprazole   traMADol 50 MG tablet Commonly known as: ULTRAM       TAKE these medications    acetaminophen 325 MG tablet Commonly known as: TYLENOL Take 2 tablets (650 mg total) by mouth every 6 (six) hours as needed for mild pain, fever or headache.   albuterol 108 (90 Base) MCG/ACT inhaler Commonly known as: VENTOLIN HFA Inhale 2 puffs into the lungs every 4 (four) hours as needed for wheezing or shortness of breath.   ALPRAZolam 0.25 MG tablet Commonly known as: XANAX Take 0.25 mg by mouth at bedtime as needed for anxiety.   Azelastine HCl 137 MCG/SPRAY Soln Place 1  spray into both nostrils 2 (two) times daily.   buPROPion 150 MG 12 hr tablet Commonly known as: ZYBAN Take 150 mg by mouth daily.   escitalopram 10 MG tablet Commonly known as: LEXAPRO escitalopram 10 mg tablet   fexofenadine 60 MG tablet Commonly known as: ALLEGRA Take 60 mg by mouth 2 (two) times daily as needed for allergies or rhinitis.   fluticasone 50 MCG/ACT nasal spray Commonly known as: FLONASE Place 2 sprays into both nostrils daily.   gabapentin 300 MG capsule Commonly known as: NEURONTIN Take 300 mg by mouth 3 (three) times daily.   mometasone 50 MCG/ACT nasal spray Commonly known as: NASONEX Place 2 sprays into the nose daily.   montelukast 10 MG tablet Commonly known as: SINGULAIR Take by mouth.   Omega-3 1000 MG Caps Take by mouth.   omega-3 acid ethyl esters 1 g capsule Commonly known as: LOVAZA Take by mouth 2 (two) times daily.   pantoprazole 40 MG tablet Commonly known as:  Protonix Take 1 tablet (40 mg total) by mouth daily.   solifenacin 5 MG tablet Commonly known as: VESICARE Take by mouth.   sucralfate 1 g tablet Commonly known as: Carafate Take 1 tablet (1 g total) by mouth 4 (four) times daily as needed for up to 15 days.   terconazole 80 MG vaginal suppository Commonly known as: TERAZOL 3 Place vaginally.   vitamin B-12 1000 MCG tablet Commonly known as: CYANOCOBALAMIN Take 1,000 mcg by mouth daily.   zolpidem 5 MG tablet Commonly known as: AMBIEN zolpidem 5 mg tablet        Follow-up Information     Hande, Vishwanath, MD Follow up in 1 week(s).   Specialty: Internal Medicine Contact information: [address] [place] [address] [place], PA-C Follow up in 1 week(s).   Specialty: Gastroenterology               Allergies  Allergen Reactions   Levaquin [Levofloxacin In D5w] Other (See Comments)    dizziness   Meloxicam    Codeine Anxiety   Sucralfate Nausea Only    Consultations: Cardiology Gastroenterology   Procedures/Studies: DG Chest 2 View  Result Date: 12/26/2020 CLINICAL DATA:  Shortness of breath EXAM: CHEST - 2 VIEW COMPARISON:  February 12, 2017, September 01, 2020 FINDINGS: The cardiomediastinal silhouette is unchanged in contour.Atherosclerotic calcifications. No pleural effusion. No pneumothorax. No acute pleuroparenchymal abnormality. Visualized abdomen is unremarkable. Multilevel degenerative changes of the thoracic spine. IMPRESSION: No acute cardiopulmonary abnormality. Electronically Signed   By: Valentino Saxon MD   On: 12/26/2020 13:53   CT Angio Abd/Pel W and/or Wo Contrast  Result Date: 12/26/2020 CLINICAL DATA:  76 year old female with rectal bleeding EXAM: CTA ABDOMEN AND PELVIS WITHOUT AND WITH CONTRAST TECHNIQUE: Multidetector CT imaging of the abdomen and pelvis was performed using the standard protocol during bolus  administration of intravenous contrast. Multiplanar reconstructed images and MIPs were obtained and reviewed to evaluate the vascular anatomy. CONTRAST:  61mL OMNIPAQUE IOHEXOL 350 MG/ML SOLN COMPARISON:  CT abdomen/pelvis 09/01/2020 FINDINGS: VASCULAR Aorta: Normal caliber aorta without aneurysm, dissection, vasculitis or significant stenosis. Scattered atherosclerotic vascular plaque. Celiac: Patent without evidence of aneurysm, dissection or significant stenosis gas occlusion. Variant anatomy. The celiac axis proper gives rise to the splenic and common hepatic artery. The right inferior phrenic and left gastric artery have a separate origin from the aorta. The lateral segmental branch of  the left hepatic artery is replaced to the left gastric artery. SMA: Patent without evidence of aneurysm, dissection, vasculitis or significant stenosis. Renals: Solitary renal arteries bilaterally. The origins are widely patent. Beading in the mid aspect of the right renal artery most consistent with changes of fibromuscular dysplasia. No definite changes within the left renal artery. IMA: Patent without evidence of aneurysm, dissection, vasculitis or significant stenosis. Inflow: Patent without evidence of aneurysm, dissection, vasculitis or significant stenosis. Proximal Outflow: Bilateral common femoral and visualized portions of the superficial and profunda femoral arteries are patent without evidence of aneurysm, dissection, vasculitis or significant stenosis. Veins: No focal venous abnormality. Review of the MIP images confirms the above findings. NON-VASCULAR Lower chest: No acute abnormality. Hepatobiliary: Geographic hypoattenuation in the left hemi-liver adjacent to the fissure for the falciform ligament is nonspecific but most suggestive of benign focal fatty infiltration. Additionally, the hepatic parenchyma overall is low in attenuation with relative sparing adjacent to the gallbladder fossa consistent with a lesser  degree of diffuse focal fatty infiltration. Small benign cyst versus hamartoma in the posterior aspect of the right hepatic dome. Otherwise, no discrete lesion. Gallbladder is unremarkable. No intra or extrahepatic biliary ductal dilatation. Pancreas: Unremarkable. No pancreatic ductal dilatation or surrounding inflammatory changes. Spleen: Normal in size without focal abnormality. Adrenals/Urinary Tract: Normal adrenal glands. No hydronephrosis, nephrolithiasis or enhancing renal aspirated innumerable bilateral low-attenuation renal lesions almost all of which are too small for accurate characterization. Statistically, these are highly likely benign cysts. The ureters and bladder are unremarkable. Stomach/Bowel: Advanced pan colonic diverticulosis. No evidence of active inflammation to suggest diverticulitis. No evidence of contrast extravasation to suggest active arterial bleeding. Normal appendix in the right lower quadrant. No evidence of bowel obstruction. Unremarkable stomach and small bowel. Lymphatic: No suspicious lymphadenopathy. Reproductive: Uterus and bilateral adnexa are unremarkable. Other: No evidence of ascites. Small fat containing umbilical hernia. Musculoskeletal: No acute osseous abnormality. Multilevel degenerative disc disease. IMPRESSION: 1. Negative for evidence of active GI bleed at this time. 2. Advanced pan colonic diverticulosis. No evidence of active diverticulitis. 3. Hepatic steatosis. 4. Right mid renal artery fibromuscular dysplasia. No evidence of dissection or aneurysm. 5.  Aortic Atherosclerosis (ICD10-170.0) 6. Numerous small low-attenuation lesions scattered throughout both kidneys which are too small for accurate characterization. Statistically, these are highly likely benign cysts. 7. Small fat containing umbilical hernia. Electronically Signed   By: Jacqulynn Cadet M.D.   On: 12/26/2020 13:52    Procedure:             Colonoscopy Indications:           Last colonoscopy:  August 2019, Hematochezia, Rectal                        bleeding Providers:             Lin Landsman MD, MD Referring MD:          Tracie Harrier, MD (Referring MD) Medicines:             General Anesthesia Complications:         No immediate complications. Estimated blood loss: None. Procedure:             Pre-Anesthesia Assessment:                        - Prior to the procedure, a History and Physical was  performed, and patient medications and allergies were                        reviewed. The patient is competent. The risks and                        benefits of the procedure and the sedation options and                        risks were discussed with the patient. All questions                        were answered and informed consent was obtained.                        Patient identification and proposed procedure were                        verified by the physician, the nurse, the                        anesthesiologist, the anesthetist and the technician                        in the pre-procedure area in the procedure room in the                        endoscopy suite. Mental Status Examination: alert and                        oriented. Airway Examination: normal oropharyngeal                        airway and neck mobility. Respiratory Examination:                        clear to auscultation. CV Examination: normal.                        Prophylactic Antibiotics: The patient does not require                        prophylactic antibiotics. Prior Anticoagulants: The                        patient has taken no previous anticoagulant or                        antiplatelet agents. ASA Grade Assessment: III - A                        patient with severe systemic disease. After reviewing                        the risks and benefits, the patient was deemed in                        satisfactory condition to undergo the procedure. The  anesthesia plan was to use general anesthesia.                        Immediately prior to administration of medications,                        the patient was re-assessed for adequacy to receive                        sedatives. The heart rate, respiratory rate, oxygen                        saturations, blood pressure, adequacy of pulmonary                        ventilation, and response to care were monitored                        throughout the procedure. The physical status of the                        patient was re-assessed after the procedure.                        After obtaining informed consent, the colonoscope was                        passed under direct vision. Throughout the procedure,                        the patient's blood pressure, pulse, and oxygen                        saturations were monitored continuously. The                        Colonoscope was introduced through the anus and                        advanced to the the terminal ileum. The colonoscopy                        was performed with moderate difficulty due to                        excessive bleeding, multiple diverticula in the colon                        and significant looping. Successful completion of the                        procedure was aided by applying abdominal pressure and                        lavage. The patient tolerated the procedure well. The                        quality of the bowel preparation was fair. Findings:      The perianal and digital rectal examinations were normal. Pertinent      negatives include normal  sphincter tone and no palpable rectal lesions.      Scattered small and large-mouthed diverticula were found in the entire      colon.      Red blood was found in the rectum, in the sigmoid colon, in the      descending colon, in the transverse colon and at the hepatic flexure.      There was no evidence of active bleeding in the entire colon after       thorough lavaga and careful examination of the diverticula in all      segments of colon. Yellow liquid stool was found in TI, cecum and      ascending colon      The retroflexed view of the distal rectum and anal verge was normal and      showed no anal or rectal abnormalities.      The terminal ileum appeared normal. Impression:            - Preparation of the colon was fair.                        - Diverticulosis in the entire examined colon, souce                        of rectal bleeding.                        - Blood in the rectum, in the sigmoid colon, in the                        descending colon, in the transverse colon and at the                        hepatic flexure.                        - The distal rectum and anal verge are normal on                        retroflexion view.                        - The examined portion of the ileum was normal.                        - No specimens collected. Recommendation:        - Return patient to hospital ward for ongoing care.                        - Full liquid diet today.                        - Continue present medications.                        - Do a GI bleeding (tagged RBC) scan if rectal                        bleeding recurs                        - Avoid constipation. Procedure Code(s):     ---  Professional ---                        [phone], Colonoscopy, flexible; diagnostic, including                        collection of specimen(s) by brushing or washing, when                        performed (separate procedure) Diagnosis Code(s):     --- Professional ---                        K62.5, Hemorrhage of anus and rectum                        K92.2, Gastrointestinal hemorrhage, unspecified                        K92.1, Melena (includes Hematochezia)                        K57.30, Diverticulosis of large intestine without                        perforation or abscess without bleeding CPT copyright 2019 American Medical  Association. All rights reserved. The codes documented in this report are preliminary and upon coder review may be revised to meet current compliance requirements. Dr. Ulyess Mort Lin Landsman MD, MD 12/28/2020 5:13:28 PM This report has been signed electronically. Number of Addenda: 0 Note Initiated On: 12/28/2020 4:16 PM Scope Withdrawal Time: 0 hours 21 minutes 34 seconds Total Procedure Duration: 0 hours 32 minutes 19 seconds Estimated Blood Loss:  Estimated blood loss: none.      [facility]  Subjective:  Patient still reports no bowel movement since her colonoscopy, she denies any dizziness, lightheadedness, no chest pain or shortness of breath.   Discharge Exam: Vitals:   12/31/20 0736 12/31/20 1146  BP: (!) 100/50 129/62  Pulse: 63 (!) 56  Resp: 16 18  Temp: 97.8 F (36.6 C) 98.4 F (36.9 C)  SpO2: 99% 100%   Vitals:   12/31/20 0211 12/31/20 0446 12/31/20 0736 12/31/20 1146  BP: (!) 141/69 (!) 109/58 (!) 100/50 129/62  Pulse:  (!) 53 63 (!) 56  Resp:  16 16 18   Temp:  97.7 F (36.5 C) 97.8 F (36.6 C) 98.4 F (36.9 C)  TempSrc:  Oral Oral Oral  SpO2:  100% 99% 100%  Weight:      Height:        General: Pt is alert, awake, not in acute distress Cardiovascular: RRR, S1/S2 +, no rubs, no gallops Respiratory: CTA bilaterally, no wheezing, no rhonchi Abdominal: Soft, NT, ND, bowel sounds + Extremities: no edema, no cyanosis    The results of significant diagnostics from this hospitalization (including imaging, microbiology, ancillary and laboratory) are listed below for reference.     Microbiology: Recent Results (from the past 240 hour(s))  Resp Panel by RT-PCR (Flu A&B, Covid) Nasopharyngeal Swab     Status: None   Collection Time: 12/26/20 12:57 PM   Specimen: Nasopharyngeal Swab; Nasopharyngeal(NP) swabs in vial transport medium  Result Value Ref Range Status   SARS Coronavirus 2 by RT PCR NEGATIVE NEGATIVE Final     Comment: (NOTE) SARS-CoV-2 target nucleic acids  are NOT DETECTED.  The SARS-CoV-2 RNA is generally detectable in upper respiratory specimens during the acute phase of infection. The lowest concentration of SARS-CoV-2 viral copies this assay can detect is 138 copies/mL. A negative result does not preclude SARS-Cov-2 infection and should not be used as the sole basis for treatment or other patient management decisions. A negative result may occur with  improper specimen collection/handling, submission of specimen other than nasopharyngeal swab, presence of viral mutation(s) within the areas targeted by this assay, and inadequate number of viral copies(<138 copies/mL). A negative result must be combined with clinical observations, patient history, and epidemiological information. The expected result is Negative.  Fact Sheet for Patients:  EntrepreneurPulse.com.au  Fact Sheet for Healthcare Providers:  IncredibleEmployment.be  This test is no t yet approved or cleared by the [place] FDA and  has been authorized for detection and/or diagnosis of SARS-CoV-2 by FDA under an Emergency Use Authorization (EUA). This EUA will remain  in effect (meaning this test can be used) for the duration of the COVID-19 declaration under Section 564(b)(1) of the Act, 21 U.S.C.section 360bbb-3(b)(1), unless the authorization is terminated  or revoked sooner.       Influenza A by PCR NEGATIVE NEGATIVE Final   Influenza B by PCR NEGATIVE NEGATIVE Final    Comment: (NOTE) The Xpert Xpress SARS-CoV-2/FLU/RSV plus assay is intended as an aid in the diagnosis of influenza from Nasopharyngeal swab specimens and should not be used as a sole basis for treatment. Nasal washings and aspirates are unacceptable for Xpert Xpress SARS-CoV-2/FLU/RSV testing.  Fact Sheet for Patients: EntrepreneurPulse.com.au  Fact Sheet for Healthcare  Providers: IncredibleEmployment.be  This test is not yet approved or cleared by the [place] FDA and has been authorized for detection and/or diagnosis of SARS-CoV-2 by FDA under an Emergency Use Authorization (EUA). This EUA will remain in effect (meaning this test can be used) for the duration of the COVID-19 declaration under Section 564(b)(1) of the Act, 21 U.S.C. section 360bbb-3(b)(1), unless the authorization is terminated or revoked.  Performed at [facility], [address] [place], [place] [address]      Labs: BNP (last 3 results) No results for input(s): BNP in the last 8760 hours. Basic Metabolic Panel: Recent Labs  Lab 12/26/20 0952 12/27/20 0226 12/28/20 0641 12/31/20 0327  NA 133* 134* 137 139  K 3.5 4.0 3.7 3.4*  CL 99 101 106 109  CO2 22 27 27 25   GLUCOSE 160* 140* 119* 139*  BUN 28* 23 13 10   CREATININE 1.18* 1.01* 0.95 1.10*  CALCIUM 9.0 8.6* 7.9* 8.4*  MG  --  2.2  --   --   PHOS  --  3.8  --   --    Liver Function Tests: Recent Labs  Lab 12/26/20 0952 12/27/20 0226  AST 27 24  ALT 18 16  ALKPHOS 56 47  BILITOT 0.8 0.7  PROT 7.7 6.3*  ALBUMIN 4.1 3.5   No results for input(s): LIPASE, AMYLASE in the last 168 hours. No results for input(s): AMMONIA in the last 168 hours. CBC: Recent Labs  Lab 12/26/20 0952 12/26/20 1257 12/26/20 2015 12/27/20 0226 12/27/20 0822 12/27/20 1524 12/28/20 0641 12/28/20 1431 12/29/20 0325 12/30/20 0503 12/31/20 0327  WBC 7.3  --   --  8.7  --   --   --   --   --   --  8.4  HGB 13.0 12.9 12.2 10.9* 11.0*   < > 8.5* 7.5* 9.1*  8.7* 8.7*  HCT 38.0 38.5 35.9* 32.7* 32.1*  --   --   --   --   --  25.8*  MCV 88.6  --   --  90.1  --   --   --   --   --   --  91.5  PLT 381  --   --  345  --   --   --   --   --   --  330   < > = values in this interval not displayed.   Cardiac Enzymes: No results for input(s): CKTOTAL, CKMB, CKMBINDEX, TROPONINI in the last 168  hours. BNP: Invalid input(s): POCBNP CBG: Recent Labs  Lab 12/30/20 1159 12/30/20 1612 12/31/20 0028 12/31/20 0736 12/31/20 1146  GLUCAP 111* 114* 87 88 147*   D-Dimer No results for input(s): DDIMER in the last 72 hours. Hgb A1c No results for input(s): HGBA1C in the last 72 hours. Lipid Profile No results for input(s): CHOL, HDL, LDLCALC, TRIG, CHOLHDL, LDLDIRECT in the last 72 hours. Thyroid function studies No results for input(s): TSH, T4TOTAL, T3FREE, THYROIDAB in the last 72 hours.  Invalid input(s): FREET3 Anemia work up Recent Labs    12/29/20 0325  VITAMINB12 823   Urinalysis    Component Value Date/Time   COLORURINE YELLOW (A) 09/01/2020 0855   APPEARANCEUR HAZY (A) 09/01/2020 0855   APPEARANCEUR Clear 05/27/2020 1359   LABSPEC 1.011 09/01/2020 0855   PHURINE 6.0 09/01/2020 0855   GLUCOSEU NEGATIVE 09/01/2020 0855   HGBUR SMALL (A) 09/01/2020 0855   BILIRUBINUR NEGATIVE 09/01/2020 0855   BILIRUBINUR Negative 05/27/2020 [place] 09/01/2020 0855   PROTEINUR NEGATIVE 09/01/2020 0855   NITRITE NEGATIVE 09/01/2020 0855   LEUKOCYTESUR NEGATIVE 09/01/2020 0855   Sepsis Labs Invalid input(s): PROCALCITONIN,  WBC,  LACTICIDVEN Microbiology Recent Results (from the past 240 hour(s))  Resp Panel by RT-PCR (Flu A&B, Covid) Nasopharyngeal Swab     Status: None   Collection Time: 12/26/20 12:57 PM   Specimen: Nasopharyngeal Swab; Nasopharyngeal(NP) swabs in vial transport medium  Result Value Ref Range Status   SARS Coronavirus 2 by RT PCR NEGATIVE NEGATIVE Final    Comment: (NOTE) SARS-CoV-2 target nucleic acids are NOT DETECTED.  The SARS-CoV-2 RNA is generally detectable in upper respiratory specimens during the acute phase of infection. The lowest concentration of SARS-CoV-2 viral copies this assay can detect is 138 copies/mL. A negative result does not preclude SARS-Cov-2 infection and should not be used as the sole basis for treatment  or other patient management decisions. A negative result may occur with  improper specimen collection/handling, submission of specimen other than nasopharyngeal swab, presence of viral mutation(s) within the areas targeted by this assay, and inadequate number of viral copies(<138 copies/mL). A negative result must be combined with clinical observations, patient history, and epidemiological information. The expected result is Negative.  Fact Sheet for Patients:  EntrepreneurPulse.com.au  Fact Sheet for Healthcare Providers:  IncredibleEmployment.be  This test is no t yet approved or cleared by the [place] FDA and  has been authorized for detection and/or diagnosis of SARS-CoV-2 by FDA under an Emergency Use Authorization (EUA). This EUA will remain  in effect (meaning this test can be used) for the duration of the COVID-19 declaration under Section 564(b)(1) of the Act, 21 U.S.C.section 360bbb-3(b)(1), unless the authorization is terminated  or revoked sooner.       Influenza A by PCR NEGATIVE NEGATIVE Final   Influenza B by  PCR NEGATIVE NEGATIVE Final    Comment: (NOTE) The Xpert Xpress SARS-CoV-2/FLU/RSV plus assay is intended as an aid in the diagnosis of influenza from Nasopharyngeal swab specimens and should not be used as a sole basis for treatment. Nasal washings and aspirates are unacceptable for Xpert Xpress SARS-CoV-2/FLU/RSV testing.  Fact Sheet for Patients: EntrepreneurPulse.com.au  Fact Sheet for Healthcare Providers: IncredibleEmployment.be  This test is not yet approved or cleared by the [place] FDA and has been authorized for detection and/or diagnosis of SARS-CoV-2 by FDA under an Emergency Use Authorization (EUA). This EUA will remain in effect (meaning this test can be used) for the duration of the COVID-19 declaration under Section 564(b)(1) of the Act, 21 U.S.C. section  360bbb-3(b)(1), unless the authorization is terminated or revoked.  Performed at [facility], [address] [place], [place] [address]      Time coordinating discharge: Over 30 minutes  SIGNED:   Phillips Climes, MD  [facility] 12/31/2020, 12:36 PM Pager   If 7PM-7AM, please contact night-coverage [email or web address] Password [email or web address]

## 2020-12-31 NOTE — Discharge Instructions (Signed)
Follow with Primary MD Tracie Harrier, MD in 7 days   Get CBC, CMP,  checked  by Primary MD next visit.    Activity: As tolerated with Full fall precautions use walker/cane & assistance as needed   Disposition Home    Diet: Heart Healthy .   On your next visit with your primary care physician please Get Medicines reviewed and adjusted.   Please request your Prim.MD to go over all Hospital Tests and Procedure/Radiological results at the follow up, please get all Hospital records sent to your Prim MD by signing hospital release before you go home.   If you experience worsening of your admission symptoms, develop shortness of breath, life threatening emergency, suicidal or homicidal thoughts you must seek medical attention immediately by calling 911 or calling your MD immediately  if symptoms less severe.  You Must read complete instructions/literature along with all the possible adverse reactions/side effects for all the Medicines you take and that have been prescribed to you. Take any new Medicines after you have completely understood and accpet all the possible adverse reactions/side effects.   Do not drive, operating heavy machinery, perform activities at heights, swimming or participation in water activities or provide baby sitting services if your were admitted for syncope or siezures until you have seen by Primary MD or a Neurologist and advised to do so again.  Do not drive when taking Pain medications.    Do not take more than prescribed Pain, Sleep and Anxiety Medications  Special Instructions: If you have smoked or chewed Tobacco  in the last 2 yrs please stop smoking, stop any regular Alcohol  and or any Recreational drug use.  Wear Seat belts while driving.   Please note  You were cared for by a hospitalist during your hospital stay. If you have any questions about your discharge medications or the care you received while you were in the hospital after you are  discharged, you can call the unit and asked to speak with the hospitalist on call if the hospitalist that took care of you is not available. Once you are discharged, your primary care physician will handle any further medical issues. Please note that NO REFILLS for any discharge medications will be authorized once you are discharged, as it is imperative that you return to your primary care physician (or establish a relationship with a primary care physician if you do not have one) for your aftercare needs so that they can reassess your need for medications and monitor your lab values.

## 2020-12-31 NOTE — Plan of Care (Signed)
  Problem: Health Behavior/Discharge Planning: Goal: Ability to manage health-related needs will improve Outcome: Progressing   Problem: Clinical Measurements: Goal: Ability to maintain clinical measurements within normal limits will improve Outcome: Progressing   Problem: Activity: Goal: Risk for activity intolerance will decrease Outcome: Progressing   Problem: Pain Managment: Goal: General experience of comfort will improve Outcome: Progressing   Problem: Safety: Goal: Ability to remain free from injury will improve Outcome: Progressing   

## 2021-03-10 ENCOUNTER — Other Ambulatory Visit: Payer: Self-pay | Admitting: Internal Medicine

## 2021-03-10 DIAGNOSIS — Z1231 Encounter for screening mammogram for malignant neoplasm of breast: Secondary | ICD-10-CM

## 2021-05-06 ENCOUNTER — Other Ambulatory Visit: Payer: Self-pay

## 2021-05-06 ENCOUNTER — Ambulatory Visit
Admission: RE | Admit: 2021-05-06 | Discharge: 2021-05-06 | Disposition: A | Discharge status: Home / Self Care | Payer: Medicare HMO | Source: Ambulatory Visit | Attending: Internal Medicine | Admitting: Internal Medicine

## 2021-05-06 DIAGNOSIS — Z1231 Encounter for screening mammogram for malignant neoplasm of breast: Secondary | ICD-10-CM | POA: Diagnosis present

## 2021-06-10 ENCOUNTER — Emergency Department: Payer: Medicare HMO

## 2021-06-10 ENCOUNTER — Inpatient Hospital Stay
Admission: EM | Admit: 2021-06-10 | Discharge: 2021-06-12 | DRG: 379 | Disposition: A | Discharge status: Home / Self Care | Payer: Medicare HMO | Attending: Internal Medicine | Admitting: Internal Medicine

## 2021-06-10 ENCOUNTER — Encounter: Payer: Self-pay | Admitting: Emergency Medicine

## 2021-06-10 ENCOUNTER — Other Ambulatory Visit: Payer: Self-pay

## 2021-06-10 DIAGNOSIS — J4520 Mild intermittent asthma, uncomplicated: Secondary | ICD-10-CM | POA: Diagnosis not present

## 2021-06-10 DIAGNOSIS — K219 Gastro-esophageal reflux disease without esophagitis: Secondary | ICD-10-CM | POA: Diagnosis present

## 2021-06-10 DIAGNOSIS — F419 Anxiety disorder, unspecified: Secondary | ICD-10-CM | POA: Diagnosis not present

## 2021-06-10 DIAGNOSIS — I129 Hypertensive chronic kidney disease with stage 1 through stage 4 chronic kidney disease, or unspecified chronic kidney disease: Secondary | ICD-10-CM | POA: Diagnosis present

## 2021-06-10 DIAGNOSIS — K5792 Diverticulitis of intestine, part unspecified, without perforation or abscess without bleeding: Secondary | ICD-10-CM | POA: Diagnosis not present

## 2021-06-10 DIAGNOSIS — Z885 Allergy status to narcotic agent status: Secondary | ICD-10-CM

## 2021-06-10 DIAGNOSIS — K5900 Constipation, unspecified: Secondary | ICD-10-CM | POA: Diagnosis present

## 2021-06-10 DIAGNOSIS — E860 Dehydration: Secondary | ICD-10-CM

## 2021-06-10 DIAGNOSIS — Z881 Allergy status to other antibiotic agents status: Secondary | ICD-10-CM

## 2021-06-10 DIAGNOSIS — I10 Essential (primary) hypertension: Secondary | ICD-10-CM | POA: Diagnosis present

## 2021-06-10 DIAGNOSIS — E1122 Type 2 diabetes mellitus with diabetic chronic kidney disease: Secondary | ICD-10-CM | POA: Diagnosis present

## 2021-06-10 DIAGNOSIS — E669 Obesity, unspecified: Secondary | ICD-10-CM | POA: Diagnosis present

## 2021-06-10 DIAGNOSIS — K922 Gastrointestinal hemorrhage, unspecified: Principal | ICD-10-CM | POA: Diagnosis present

## 2021-06-10 DIAGNOSIS — Z20822 Contact with and (suspected) exposure to covid-19: Secondary | ICD-10-CM | POA: Diagnosis present

## 2021-06-10 DIAGNOSIS — N183 Chronic kidney disease, stage 3 unspecified: Secondary | ICD-10-CM | POA: Diagnosis present

## 2021-06-10 DIAGNOSIS — Z8673 Personal history of transient ischemic attack (TIA), and cerebral infarction without residual deficits: Secondary | ICD-10-CM

## 2021-06-10 DIAGNOSIS — K2970 Gastritis, unspecified, without bleeding: Secondary | ICD-10-CM | POA: Diagnosis present

## 2021-06-10 DIAGNOSIS — K5791 Diverticulosis of intestine, part unspecified, without perforation or abscess with bleeding: Principal | ICD-10-CM | POA: Diagnosis present

## 2021-06-10 DIAGNOSIS — R109 Unspecified abdominal pain: Secondary | ICD-10-CM

## 2021-06-10 DIAGNOSIS — Z79899 Other long term (current) drug therapy: Secondary | ICD-10-CM

## 2021-06-10 DIAGNOSIS — K2900 Acute gastritis without bleeding: Secondary | ICD-10-CM

## 2021-06-10 DIAGNOSIS — Z888 Allergy status to other drugs, medicaments and biological substances status: Secondary | ICD-10-CM

## 2021-06-10 DIAGNOSIS — J45909 Unspecified asthma, uncomplicated: Secondary | ICD-10-CM | POA: Diagnosis present

## 2021-06-10 DIAGNOSIS — E785 Hyperlipidemia, unspecified: Secondary | ICD-10-CM | POA: Diagnosis present

## 2021-06-10 DIAGNOSIS — Z6830 Body mass index (BMI) 30.0-30.9, adult: Secondary | ICD-10-CM

## 2021-06-10 DIAGNOSIS — Z886 Allergy status to analgesic agent status: Secondary | ICD-10-CM

## 2021-06-10 DIAGNOSIS — F32A Depression, unspecified: Secondary | ICD-10-CM | POA: Diagnosis present

## 2021-06-10 DIAGNOSIS — K2971 Gastritis, unspecified, with bleeding: Secondary | ICD-10-CM | POA: Diagnosis present

## 2021-06-10 LAB — HEMOGLOBIN AND HEMATOCRIT, BLOOD
HCT: 34.5 % — ABNORMAL LOW (ref 36.0–46.0)
Hemoglobin: 11.1 g/dL — ABNORMAL LOW (ref 12.0–15.0)

## 2021-06-10 LAB — COMPREHENSIVE METABOLIC PANEL
ALT: 16 U/L (ref 0–44)
AST: 20 U/L (ref 15–41)
Albumin: 3.9 g/dL (ref 3.5–5.0)
Alkaline Phosphatase: 60 U/L (ref 38–126)
Anion gap: 6 (ref 5–15)
BUN: 34 mg/dL — ABNORMAL HIGH (ref 8–23)
CO2: 22 mmol/L (ref 22–32)
Calcium: 8.7 mg/dL — ABNORMAL LOW (ref 8.9–10.3)
Chloride: 107 mmol/L (ref 98–111)
Creatinine, Ser: 0.97 mg/dL (ref 0.44–1.00)
GFR, Estimated: >60 mL/min (ref >60)
Glucose, Bld: 124 mg/dL — ABNORMAL HIGH (ref 70–99)
Potassium: 3.7 mmol/L (ref 3.5–5.1)
Sodium: 135 mmol/L (ref 135–145)
Total Bilirubin: 0.7 mg/dL (ref 0.3–1.2)
Total Protein: 7.2 g/dL (ref 6.5–8.1)

## 2021-06-10 LAB — CBC
HCT: 34.1 % — ABNORMAL LOW (ref 36.0–46.0)
Hemoglobin: 11.5 g/dL — ABNORMAL LOW (ref 12.0–15.0)
MCH: 30.7 pg (ref 26.0–34.0)
MCHC: 33.7 g/dL (ref 30.0–36.0)
MCV: 90.9 fL (ref 80.0–100.0)
Platelets: 360 10*3/uL (ref 150–400)
RBC: 3.75 MIL/uL — ABNORMAL LOW (ref 3.87–5.11)
RDW: 15.6 % — ABNORMAL HIGH (ref 11.5–15.5)
WBC: 8.3 10*3/uL (ref 4.0–10.5)
nRBC: 0 % (ref 0.0–0.2)

## 2021-06-10 LAB — TYPE AND SCREEN
ABO/RH(D): O POS
Antibody Screen: NEGATIVE

## 2021-06-10 LAB — RESP PANEL BY RT-PCR (FLU A&B, COVID) ARPGX2
Influenza A by PCR: NEGATIVE
Influenza B by PCR: NEGATIVE
SARS Coronavirus 2 by RT PCR: NEGATIVE

## 2021-06-10 MED ORDER — LACTATED RINGERS IV BOLUS
1000.0000 mL | Freq: Once | INTRAVENOUS | Status: AC
  Administered 2021-06-10: 1000 mL via INTRAVENOUS

## 2021-06-10 MED ORDER — CALCIUM CARBONATE 1250 (500 CA) MG PO TABS
1.0000 | ORAL_TABLET | Freq: Every day | ORAL | Status: DC
  Administered 2021-06-11 – 2021-06-12 (×2): 500 mg via ORAL
  Filled 2021-06-10 (×2): qty 1

## 2021-06-10 MED ORDER — PIPERACILLIN-TAZOBACTAM 3.375 G IVPB 30 MIN
3.3750 g | Freq: Once | INTRAVENOUS | Status: AC
  Administered 2021-06-10: 3.375 g via INTRAVENOUS
  Filled 2021-06-10: qty 50

## 2021-06-10 MED ORDER — HYDRALAZINE HCL 20 MG/ML IJ SOLN: 10.0000 mg | Freq: Four times a day (QID) | INTRAMUSCULAR | Status: DC | PRN

## 2021-06-10 MED ORDER — SODIUM CHLORIDE 0.9 % IV SOLN: INTRAVENOUS | Status: DC

## 2021-06-10 MED ORDER — IOHEXOL 350 MG/ML SOLN
100.0000 mL | Freq: Once | INTRAVENOUS | Status: AC | PRN
  Administered 2021-06-10: 100 mL via INTRAVENOUS
  Filled 2021-06-10: qty 100

## 2021-06-10 MED ORDER — PANTOPRAZOLE SODIUM 40 MG PO TBEC
40.0000 mg | DELAYED_RELEASE_TABLET | Freq: Every day | ORAL | Status: DC
  Administered 2021-06-10 – 2021-06-12 (×3): 40 mg via ORAL
  Filled 2021-06-10 (×3): qty 1

## 2021-06-10 MED ORDER — MORPHINE SULFATE (PF) 4 MG/ML IV SOLN
4.0000 mg | Freq: Once | INTRAVENOUS | Status: AC
  Administered 2021-06-10: 4 mg via INTRAVENOUS
  Filled 2021-06-10: qty 1

## 2021-06-10 MED ORDER — PIPERACILLIN-TAZOBACTAM 3.375 G IVPB 30 MIN
3.3750 g | Freq: Three times a day (TID) | INTRAVENOUS | Status: DC
  Administered 2021-06-11 – 2021-06-12 (×4): 3.375 g via INTRAVENOUS
  Filled 2021-06-10 (×9): qty 50

## 2021-06-10 MED ORDER — MORPHINE SULFATE (PF) 2 MG/ML IV SOLN
2.0000 mg | INTRAVENOUS | Status: DC | PRN
  Administered 2021-06-11: 2 mg via INTRAVENOUS
  Filled 2021-06-10: qty 1

## 2021-06-10 NOTE — ED Provider Notes (Signed)
[facility] Emergency Department Provider Note   ____________________________________________   Event Date/Time   First MD Initiated Contact with Patient 06/10/21 1346     (approximate)  I have reviewed the triage vital signs and the nursing notes.   HISTORY  Chief Complaint Rectal Bleeding    HPI Jackie Hunter is a 75 y.o. female with past medical history of hypertension, hyperlipidemia, diabetes, CKD, asthma, GERD, and diverticulosis who presents to the ED complaining of bloody stools.  Patient ports that last night she began having multiple bloody bowel movements, as many as 8 bowel movements over the course of the night.  She reports having 3 more bloody bowel movements this morning and 2 more since arriving to the ED.  She denies any associated abdominal pain, nausea, or vomiting, but does state that she is starting to feel dizzy and lightheaded.  She denies any associated chest pain or shortness of breath.  She describes symptoms as similar to when she required admission to the hospital in June for a diverticular bleed.  She does not take any blood thinners.        Past Medical History:  Diagnosis Date   Adenoma of colon    Allergic rhinitis    Anxiety    Arthritis    Asthma    Back pain    Carpal tunnel syndrome    Carpal tunnel syndrome    Carpal tunnel syndrome    Cataract cortical, senile    Cervicalgia    Chronic kidney disease    STAGE 3   CKD (chronic kidney disease)    DDD (degenerative disc disease), lumbar    DDD (degenerative disc disease), lumbar    Diverticulosis    Gastritis    GERD (gastroesophageal reflux disease)    Headache    Hiatal hernia    History of hiatal hernia    Hypertension    Lipids serum increased    Lumbago    Migraines    Obesity    Osteoarthritis    Stroke ([place])    TIA (transient ischemic attack)    Wrist fracture     Patient Active Problem List   Diagnosis Date Noted   Acute blood loss  anemia    Type 2 diabetes mellitus without complication, without long-term current use of insulin (HCC)    CKD (chronic kidney disease), stage II    Lower GI bleed 12/27/2020   Weakness    Impaired fasting glucose    B12 deficiency    GI bleed 12/26/2020   Pelvic pain in female 05/27/2020   Fatty infiltration of liver 08/03/2019   Gastritis without bleeding 08/03/2019   Diverticulosis of large intestine without hemorrhage 08/25/2016   Hand pain 04/18/2016   Migraines 02/18/2016   CKD (chronic kidney disease) stage 3, GFR 30-59 ml/min (HCC) 08/13/2015   Moderate episode of recurrent major depressive disorder ([place]) 08/13/2015   DDD (degenerative disc disease), lumbar 04/02/2015   Lumbar radiculitis 04/02/2015   Lumbar stenosis with neurogenic claudication 04/02/2015   Depressive disorder 11/05/2014   Essential hypertension 11/05/2014   Gastroesophageal reflux disease without esophagitis 11/05/2014   Generalized osteoarthritis of multiple sites 11/05/2014   Subcutaneous nodule 11/05/2014   Anxiety and depression 06/25/2014   Asthma without status asthmaticus 06/25/2014   Benign essential hypertension 06/25/2014   Other and unspecified hyperlipidemia 06/25/2014   Frequency of micturition 11/29/2013   Urinary urgency 11/29/2013   Increased frequency of urination 11/29/2013   Recurrent urinary tract  infection 02/08/2013   Urinary tract infection 02/08/2013    Past Surgical History:  Procedure Laterality Date   BREAST BIOPSY Right 2014   NEG   BREAST BIOPSY Left 11/24/2016   [place] core, radial scar    BREAST EXCISIONAL BIOPSY Left 1967   NEG   cataracts Bilateral    COLONOSCOPY WITH PROPOFOL N/A 05/03/2016   Procedure: COLONOSCOPY WITH PROPOFOL;  Surgeon: Lollie Sails, MD;  Location[facility] ENDOSCOPY;  Service: Endoscopy;  Laterality: N/A;   COLONOSCOPY WITH PROPOFOL N/A 11/01/2016   Procedure: COLONOSCOPY WITH PROPOFOL;  Surgeon: Lollie Sails, MD;  Location: [facility]  ENDOSCOPY;  Service: Endoscopy;  Laterality: N/A;   COLONOSCOPY WITH PROPOFOL N/A 11/02/2016   Procedure: COLONOSCOPY WITH PROPOFOL;  Surgeon: Lollie Sails, MD;  Location: [facility] ENDOSCOPY;  Service: Endoscopy;  Laterality: N/A;   COLONOSCOPY WITH PROPOFOL N/A 12/01/2017   Procedure: COLONOSCOPY WITH PROPOFOL;  Surgeon: Lollie Sails, MD;  Location: [facility] ENDOSCOPY;  Service: Endoscopy;  Laterality: N/A;   COLONOSCOPY WITH PROPOFOL N/A 03/02/2018   Procedure: COLONOSCOPY WITH PROPOFOL;  Surgeon: Lollie Sails, MD;  Location: [facility] ENDOSCOPY;  Service: Endoscopy;  Laterality: N/A;   COLONOSCOPY WITH PROPOFOL N/A 12/28/2020   Procedure: COLONOSCOPY WITH PROPOFOL;  Surgeon: Lin Landsman, MD;  Location: [facility] ENDOSCOPY;  Service: Gastroenterology;  Laterality: N/A;   ESOPHAGOGASTRODUODENOSCOPY (EGD) WITH PROPOFOL N/A 10/26/2015   Procedure: ESOPHAGOGASTRODUODENOSCOPY (EGD) WITH PROPOFOL;  Surgeon: [name], MD;  Location[facility] ENDOSCOPY;  Service: Gastroenterology;  Laterality: N/A;   ESOPHAGOGASTRODUODENOSCOPY (EGD) WITH PROPOFOL N/A 02/12/2019   Procedure: ESOPHAGOGASTRODUODENOSCOPY (EGD) WITH PROPOFOL;  Surgeon: Lollie Sails, MD;  Location: [facility] ENDOSCOPY;  Service: Endoscopy;  Laterality: N/A;   EYE SURGERY     OOPHORECTOMY     left   OOPHORECTOMY Left     Prior to Admission medications   Medication Sig Start Date End Date Taking? Authorizing Provider  acetaminophen (TYLENOL) 325 MG tablet Take 2 tablets (650 mg total) by mouth every 6 (six) hours as needed for mild pain, fever or headache. 12/31/20   Elgergawy, Silver Huguenin, MD  albuterol (VENTOLIN HFA) 108 (90 Base) MCG/ACT inhaler Inhale 2 puffs into the lungs every 4 (four) hours as needed for wheezing or shortness of breath.    [provider]  ALPRAZolam Duanne Moron) 0.25 MG tablet Take 0.25 mg by mouth at bedtime as needed for anxiety.    [provider]  Azelastine HCl 137 MCG/SPRAY SOLN Place 1 spray into  both nostrils 2 (two) times daily. 02/14/20   [provider]  buPROPion (ZYBAN) 150 MG 12 hr tablet Take 150 mg by mouth daily.    [provider]  escitalopram (LEXAPRO) 10 MG tablet escitalopram 10 mg tablet    [provider]  fexofenadine (ALLEGRA) 60 MG tablet Take 60 mg by mouth 2 (two) times daily as needed for allergies or rhinitis.    [provider]  fluticasone (FLONASE) 50 MCG/ACT nasal spray Place 2 sprays into both nostrils daily.    [provider]  gabapentin (NEURONTIN) 300 MG capsule Take 300 mg by mouth 3 (three) times daily.    [provider]  mometasone (NASONEX) 50 MCG/ACT nasal spray Place 2 sprays into the nose daily.    [provider]  montelukast (SINGULAIR) 10 MG tablet Take by mouth. 03/06/18 03/06/19  [provider]  Omega-3 1000 MG CAPS Take by mouth.    [provider]  omega-3 acid ethyl esters (LOVAZA)  1 g capsule Take by mouth 2 (two) times daily.    [provider]  pantoprazole (PROTONIX) 40 MG tablet Take 1 tablet (40 mg total) by mouth daily. 12/31/20 03/01/21  Elgergawy, Silver Huguenin, MD  solifenacin (VESICARE) 5 MG tablet Take by mouth. 04/29/13   [provider]  sucralfate (CARAFATE) 1 g tablet Take 1 tablet (1 g total) by mouth 4 (four) times daily as needed for up to 15 days. 09/01/20 09/16/20  Arta Silence, MD  terconazole (TERAZOL 3) 80 MG vaginal suppository Place vaginally. 01/01/19   [provider]  vitamin B-12 (CYANOCOBALAMIN) 1000 MCG tablet Take 1,000 mcg by mouth daily.    [provider]  zolpidem (AMBIEN) 5 MG tablet zolpidem 5 mg tablet    [provider]    Allergies Levaquin [levofloxacin in d5w], Meloxicam, Codeine, and Sucralfate  Family History  Problem Relation Age of Onset   Kidney cancer Neg Hx    Prostate cancer Neg Hx    Breast cancer Neg Hx     Social History Social History   Tobacco Use    Smoking status: Never   Smokeless tobacco: Never  Vaping Use   Vaping Use: Never used  Substance Use Topics   Alcohol use: Yes    Comment: rarely   Drug use: Never    Review of Systems  Constitutional: No fever/chills Eyes: No visual changes. ENT: No sore throat. Cardiovascular: Denies chest pain.  Positive for dizziness and lightheadedness. Respiratory: Denies shortness of breath. Gastrointestinal: No abdominal pain.  No nausea, no vomiting.  No diarrhea.  No constipation.  Positive for bloody stool. Genitourinary: Negative for dysuria. Musculoskeletal: Negative for back pain. Skin: Negative for rash. Neurological: Negative for headaches, focal weakness or numbness.  ____________________________________________   PHYSICAL EXAM:  VITAL SIGNS: ED Triage Vitals  Enc Vitals Group     BP 06/10/21 1056 (!) 135/46     Pulse Rate 06/10/21 1056 81     Resp 06/10/21 1056 20     Temp 06/10/21 1056 97.9 F (36.6 C)     Temp Source 06/10/21 1056 Oral     SpO2 06/10/21 1056 97 %     Weight 06/10/21 1041 184 lb 15.5 oz (83.9 kg)     Height 06/10/21 1041 5\' 3"  (1.6 m)     Head Circumference --      Peak Flow --      Pain Score 06/10/21 1041 0     Pain Loc --      Pain Edu? --      Excl. in [place]? --     Constitutional: Alert and oriented. Eyes: Conjunctivae are normal. Head: Atraumatic. Nose: No congestion/rhinnorhea. Mouth/Throat: Mucous membranes are moist. Neck: Normal ROM Cardiovascular: Normal rate, regular rhythm. Grossly normal heart sounds.  2+ radial pulses bilaterally. Respiratory: Normal respiratory effort.  No retractions. Lungs CTAB. Gastrointestinal: Soft and nontender. No distention. Genitourinary: deferred Musculoskeletal: No lower extremity tenderness nor edema. Neurologic:  Normal speech and language. No gross focal neurologic deficits are appreciated. Skin:  Skin is warm, dry and intact. No rash noted. Psychiatric: Mood and affect are normal. Speech and  behavior are normal.  ____________________________________________   LABS (all labs ordered are listed, but only abnormal results are displayed)  Labs Reviewed  COMPREHENSIVE METABOLIC PANEL - Abnormal; Notable for the following components:      Result Value   Glucose, Bld 124 (*)    BUN 34 (*)    Calcium 8.7 (*)  All other components within normal limits  CBC - Abnormal; Notable for the following components:   RBC 3.75 (*)    Hemoglobin 11.5 (*)    HCT 34.1 (*)    RDW 15.6 (*)    All other components within normal limits  HEMOGLOBIN AND HEMATOCRIT, BLOOD - Abnormal; Notable for the following components:   Hemoglobin 11.1 (*)    HCT 34.5 (*)    All other components within normal limits  RESP PANEL BY RT-PCR (FLU A&B, COVID) [id number]  POC OCCULT BLOOD, ED  TYPE AND SCREEN    ED ECG REPORT I, Blake Divine, the attending physician, personally viewed and interpreted this ECG.   Date: 06/10/2021  EKG Time: 14:26  Rate: 64  Rhythm: normal sinus rhythm  Axis: Normal  Intervals:none  ST&T Change: None    PROCEDURES  Procedure(s) performed (including Critical Care):  Procedures   ____________________________________________   INITIAL IMPRESSION / ASSESSMENT AND PLAN / ED COURSE      75 year old female with past medical history of hypertension, hyperlipidemia, diabetes, CKD, asthma, GERD, and diverticular GI bleed who presents to the ED with numerous bloody bowel movements since last night, has now begun to feel dizzy and lightheaded.  Patient's vital signs are reassuring and she is overall well-appearing, however reports passing a significant amount of blood since onset of symptoms.  Hemoglobin shows small drop to 11.5 from August, when it was 13.8, and we will continue to trend H&H.  We will also obtain CTA of her abdomen/pelvis to assess for active extravasation.  She does not take any blood thinners.  Anticipate admission for further management of lower GI  bleed.  Hemoglobin is slightly downtrending, patient continues to have bloody bowel movements but and lower frequency and quantity than before.  Given she remains hemodynamically stable, we will hold off on transfusion, she reports feeling better following IV fluids.  CTA does not show any active extravasation but does have inflammatory changes concerning for diverticulitis.  On reassessment, patient does report some pain in the left lower quadrant of her abdomen and we will treat diverticulitis with IV antibiotics.  Plan to discuss with hospitalist for admission.      ____________________________________________   FINAL CLINICAL IMPRESSION(S) / ED DIAGNOSES  Final diagnoses:  Lower GI bleed  Diverticulitis     ED Discharge Orders     None        Note:  This document was prepared using Dragon voice recognition software and may include unintentional dictation errors.    Blake Divine, MD 06/10/21 [phone]

## 2021-06-10 NOTE — ED Notes (Signed)
Room darkened per patient's request.

## 2021-06-10 NOTE — H&P (Addendum)
History and Physical  DIMOND CROTTY QMG:[id number] DOB: August 01, 1945 DOA: 06/10/2021  Referring physician: Blake Divine, MD PCP: Tracie Harrier, MD  Patient coming from: Home  Chief Complaint: Rectal bleeding  HPI: Jackie Hunter is a 75 y.o. female with medical history significant for diverticulitis (2019), gastritis (July 2020), GERD, essential hypertension, chronic anxiety/depression and asthma who presents to the emergency department due to complaint of bloody stools.  She endorsed up to 8 bloody bowel movements overnight and she has had 3 bloody bowel movements this morning and 2 while in the ED.  She complained of dizziness and lightheadedness but denies chest pain, shortness of breath, nausea, vomiting.  She denies abdominal pain yesterday, but complaining of left lower abdominal pain which started this morning.  Patient states symptoms were similar to when she was admitted for diverticular bleed in June of this year.  ED Course:  In the emergency department, she was hemodynamically stable.  Work-up in the ED shows normocytic anemia with H/H at 11.5/34.1 (was 8.5/25.8 on 12/31/2020), normal BMP except for elevated BUN at 34 and CBG at 124 calcium 8.7.  Influenza A, B, SARS coronavirus 2 was negative. CT abdomen and pelvis without and with contrast showed diverticular disease of the colon with suspicion for low-level diverticulitis versus sequela of previous diverticulitis. Patient was treated with IV morphine, Zosyn was started due to diverticulitis.  Hospitalist was asked to admit patient for further evaluation and management.  Review of Systems: Constitutional: Negative for chills and fever.  HENT: Negative for ear pain and sore throat.   Eyes: Negative for pain and visual disturbance.  Respiratory: Negative for cough, chest tightness and shortness of breath.   Cardiovascular: Negative for chest pain and palpitations.  Gastrointestinal: Positive for bloody stool and abdominal  pain. Negative for vomiting.  Endocrine: Negative for polyphagia and polyuria.  Genitourinary: Negative for decreased urine volume, dysuria, enuresis Musculoskeletal: Negative for arthralgias and back pain.  Skin: Negative for color change and rash.  Allergic/Immunologic: Negative for immunocompromised state.  Neurological: Positive for lightheadedness and dizziness.  Negative for tremors, syncope, speech difficulty Hematological: Does not bruise/bleed easily.  All other systems reviewed and are negative   Past Medical History:  Diagnosis Date   Adenoma of colon    Allergic rhinitis    Anxiety    Arthritis    Asthma    Back pain    Carpal tunnel syndrome    Carpal tunnel syndrome    Carpal tunnel syndrome    Cataract cortical, senile    Cervicalgia    Chronic kidney disease    STAGE 3   CKD (chronic kidney disease)    DDD (degenerative disc disease), lumbar    DDD (degenerative disc disease), lumbar    Diverticulosis    Gastritis    GERD (gastroesophageal reflux disease)    Headache    Hiatal hernia    History of hiatal hernia    Hypertension    Lipids serum increased    Lumbago    Migraines    Obesity    Osteoarthritis    Stroke [facility])    TIA (transient ischemic attack)    Wrist fracture    Past Surgical History:  Procedure Laterality Date   BREAST BIOPSY Right 2014   NEG   BREAST BIOPSY Left 11/24/2016   [place] core, radial scar    BREAST EXCISIONAL BIOPSY Left 1967   NEG   cataracts Bilateral    COLONOSCOPY WITH PROPOFOL N/A 05/03/2016   Procedure:  COLONOSCOPY WITH PROPOFOL;  Surgeon: Lollie Sails, MD;  Location[facility] ENDOSCOPY;  Service: Endoscopy;  Laterality: N/A;   COLONOSCOPY WITH PROPOFOL N/A 11/01/2016   Procedure: COLONOSCOPY WITH PROPOFOL;  Surgeon: Lollie Sails, MD;  Location: [facility] ENDOSCOPY;  Service: Endoscopy;  Laterality: N/A;   COLONOSCOPY WITH PROPOFOL N/A 11/02/2016   Procedure: COLONOSCOPY WITH PROPOFOL;  Surgeon: Lollie Sails,  MD;  Location: [facility] ENDOSCOPY;  Service: Endoscopy;  Laterality: N/A;   COLONOSCOPY WITH PROPOFOL N/A 12/01/2017   Procedure: COLONOSCOPY WITH PROPOFOL;  Surgeon: Lollie Sails, MD;  Location: [facility] ENDOSCOPY;  Service: Endoscopy;  Laterality: N/A;   COLONOSCOPY WITH PROPOFOL N/A 03/02/2018   Procedure: COLONOSCOPY WITH PROPOFOL;  Surgeon: Lollie Sails, MD;  Location: [facility] ENDOSCOPY;  Service: Endoscopy;  Laterality: N/A;   COLONOSCOPY WITH PROPOFOL N/A 12/28/2020   Procedure: COLONOSCOPY WITH PROPOFOL;  Surgeon: Lin Landsman, MD;  Location: [facility] ENDOSCOPY;  Service: Gastroenterology;  Laterality: N/A;   ESOPHAGOGASTRODUODENOSCOPY (EGD) WITH PROPOFOL N/A 10/26/2015   Procedure: ESOPHAGOGASTRODUODENOSCOPY (EGD) WITH PROPOFOL;  Surgeon: [name], MD;  Location: [facility] ENDOSCOPY;  Service: Gastroenterology;  Laterality: N/A;   ESOPHAGOGASTRODUODENOSCOPY (EGD) WITH PROPOFOL N/A 02/12/2019   Procedure: ESOPHAGOGASTRODUODENOSCOPY (EGD) WITH PROPOFOL;  Surgeon: Lollie Sails, MD;  Location: [facility] ENDOSCOPY;  Service: Endoscopy;  Laterality: N/A;   EYE SURGERY     OOPHORECTOMY     left   OOPHORECTOMY Left     Social History:  reports that she has never smoked. She has never used smokeless tobacco. She reports current alcohol use. She reports that she does not use drugs.   Allergies  Allergen Reactions   Levaquin [Levofloxacin In D5w] Other (See Comments)    dizziness   Meloxicam    Codeine Anxiety   Sucralfate Nausea Only    Family History  Problem Relation Age of Onset   Kidney cancer Neg Hx    Prostate cancer Neg Hx    Breast cancer Neg Hx      Prior to Admission medications   Medication Sig Start Date End Date Taking? Authorizing Provider  acetaminophen (TYLENOL) 325 MG tablet Take 2 tablets (650 mg total) by mouth every 6 (six) hours as needed for mild pain, fever or headache. 12/31/20   Elgergawy, Silver Huguenin, MD  albuterol (VENTOLIN HFA) 108 (90 Base) MCG/ACT inhaler  Inhale 2 puffs into the lungs every 4 (four) hours as needed for wheezing or shortness of breath.    [provider]  ALPRAZolam Duanne Moron) 0.25 MG tablet Take 0.25 mg by mouth at bedtime as needed for anxiety.    [provider]  Azelastine HCl 137 MCG/SPRAY SOLN Place 1 spray into both nostrils 2 (two) times daily. 02/14/20   [provider]  buPROPion (ZYBAN) 150 MG 12 hr tablet Take 150 mg by mouth daily.    [provider]  escitalopram (LEXAPRO) 10 MG tablet escitalopram 10 mg tablet    [provider]  fexofenadine (ALLEGRA) 60 MG tablet Take 60 mg by mouth 2 (two) times daily as needed for allergies or rhinitis.    [provider]  fluticasone (FLONASE) 50 MCG/ACT nasal spray Place 2 sprays into both nostrils daily.    [provider]  gabapentin (NEURONTIN) 300 MG capsule Take 300 mg by mouth 3 (three) times daily.    [provider]  mometasone (NASONEX) 50 MCG/ACT nasal spray Place 2 sprays into the nose daily.    [provider]  montelukast (SINGULAIR) 10  MG tablet Take by mouth. 03/06/18 03/06/19  [provider]  Omega-3 1000 MG CAPS Take by mouth.    [provider]  omega-3 acid ethyl esters (LOVAZA) 1 g capsule Take by mouth 2 (two) times daily.    [provider]  pantoprazole (PROTONIX) 40 MG tablet Take 1 tablet (40 mg total) by mouth daily. 12/31/20 03/01/21  Elgergawy, Silver Huguenin, MD  solifenacin (VESICARE) 5 MG tablet Take by mouth. 04/29/13   [provider]  sucralfate (CARAFATE) 1 g tablet Take 1 tablet (1 g total) by mouth 4 (four) times daily as needed for up to 15 days. 09/01/20 09/16/20  Arta Silence, MD  terconazole (TERAZOL 3) 80 MG vaginal suppository Place vaginally. 01/01/19   [provider]  vitamin B-12 (CYANOCOBALAMIN) 1000 MCG tablet Take 1,000 mcg by mouth daily.    [provider]  zolpidem (AMBIEN) 5 MG tablet zolpidem 5 mg tablet     [provider]    Physical Exam: BP (!) 135/46 (BP Location: Left Arm)   Pulse 81   Temp 97.9 F (36.6 C) (Oral)   Resp 20   Ht 5\' 3"  (1.6 m)   Wt 77.1 kg   SpO2 97%   BMI 30.11 kg/m   General: 75 y.o. year-old female well developed well nourished in no acute distress.  Alert and oriented x3. HEENT: NCAT, EOMI Neck: Supple, trachea medial Cardiovascular: Regular rate and rhythm with no rubs or gallops.  No thyromegaly or JVD noted.  No lower extremity edema. 2/4 pulses in all 4 extremities. Respiratory: Clear to auscultation with no wheezes or rales. Good inspiratory effort. Abdomen: Soft, nontender nondistended with normal bowel sounds x4 quadrants. Muskuloskeletal: No cyanosis, clubbing or edema noted bilaterally Neuro: CN II-XII intact, strength 5/5 x 4, sensation, reflexes intact Skin: No ulcerative lesions noted or rashes Psychiatry: Judgement and insight appear normal. Mood is appropriate for condition and setting          Labs on Admission:  Basic Metabolic Panel: Recent Labs  Lab 06/10/21 1051  NA 135  K 3.7  CL 107  CO2 22  GLUCOSE 124*  BUN 34*  CREATININE 0.97  CALCIUM 8.7*   Liver Function Tests: Recent Labs  Lab 06/10/21 1051  AST 20  ALT 16  ALKPHOS 60  BILITOT 0.7  PROT 7.2  ALBUMIN 3.9   No results for input(s): LIPASE, AMYLASE in the last 168 hours. No results for input(s): AMMONIA in the last 168 hours. CBC: Recent Labs  Lab 06/10/21 1051 06/10/21 1415  WBC 8.3  --   HGB 11.5* 11.1*  HCT 34.1* 34.5*  MCV 90.9  --   PLT 360  --    Cardiac Enzymes: No results for input(s): CKTOTAL, CKMB, CKMBINDEX, TROPONINI in the last 168 hours.  BNP (last 3 results) No results for input(s): BNP in the last 8760 hours.  ProBNP (last 3 results) No results for input(s): PROBNP in the last 8760 hours.  CBG: No results for input(s): GLUCAP in the last 168 hours.  Radiological Exams on Admission: CT Angio Abd/Pel W and/or Wo  Contrast  Result Date: 06/10/2021 CLINICAL DATA:  75 year old female presents with rectal bleeding. EXAM: CTA ABDOMEN AND PELVIS WITHOUT AND WITH CONTRAST TECHNIQUE: Multidetector CT imaging of the abdomen and pelvis was performed using the standard protocol during bolus administration of intravenous contrast. Multiplanar reconstructed images and MIPs were obtained and reviewed to evaluate the vascular anatomy. CONTRAST:  16mL OMNIPAQUE IOHEXOL 350 MG/ML  SOLN COMPARISON:  December 26, 2020. FINDINGS: VASCULAR Aorta: Signs of aortic atherosclerosis. No perivascular stranding, sign of dissection or acute process. The aorta in the abdomen and lower chest is of normal caliber. Celiac: Patent without evidence of aneurysm, dissection, vasculitis or significant stenosis. SMA: Patent without evidence of aneurysm, dissection, vasculitis or significant stenosis. Renals: Both renal arteries are patent without evidence of aneurysm, dissection or vasculitis. Beaded appearance of the RIGHT renal artery. No substantial dilation of this vessel. Mild atherosclerotic plaque at the origin of the LEFT renal artery. IMA: No sign of stranding or dissection. 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 Proximal Outflow: Bilateral common femoral and visualized portions of the superficial and profunda femoral arteries are patent without evidence of aneurysm, dissection, vasculitis or significant stenosis. Veins: Smooth contour of the IVC. Patent veins in the abdomen and pelvis. Review of the MIP images confirms the above findings. NON-VASCULAR Lower chest: Unremarkable. Hepatobiliary: No focal, suspicious hepatic lesion. No pericholecystic stranding. No biliary duct dilation. Portal vein is patent. Pancreas: Signs of pancreatic atrophy without ductal dilation or inflammation. Spleen:  Normal. Adrenals/Urinary Tract: Adrenal glands are normal. Low-density lesions of the bilateral kidneys likely small cysts. No hydronephrosis. LEFT nephrolithiasis, a 2 mm calculus in the interpolar LEFT kidney. No perinephric stranding. Stomach/Bowel: Signs of colonic diverticulosis. No discrete area of active extravasation. Profound diverticular changes in the sigmoid displayed no substantial change compared to previous imaging. Query mild stranding about the sigmoid, quite subtle findings mainly appear to be indicative of more chronic diverticular changes. The stomach is unremarkable. Small bowel is normal caliber. The appendix is normal. Lymphatic: There is no gastrohepatic or hepatoduodenal ligament lymphadenopathy. No retroperitoneal or mesenteric lymphadenopathy. No pelvic sidewall lymphadenopathy. Reproductive: Unremarkable by CT. Other: No ascites.  No pneumoperitoneum. Musculoskeletal: No acute bone finding. No destructive bone process. Spinal degenerative changes. IMPRESSION: VASCULAR Signs of aortic atherosclerosis. No acute aortic pathology. Aortic atherosclerosis, no acute vascular findings. Beaded appearance of the RIGHT renal artery raising the question of FMD but without aneurysmal dilation and without change. 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 NON-VASCULAR: Diverticular disease of the colon, query low level diverticulitis versus sequela of previous diverticulitis. No visible area of active bleeding. Renal cysts as before. Electronically Signed   By: Zetta Bills M.D.   On: 06/10/2021 16:16    EKG: I independently viewed the EKG done and my findings are as followed: Normal sinus rhythm at a rate of 64 bpm with nonspecific T wave abnormality  Assessment/Plan Present on Admission:  GI bleed  Essential hypertension  Anxiety and  depression  Asthma without status asthmaticus  Gastritis without bleeding  Gastroesophageal reflux disease without esophagitis  Principal Problem:   GI bleed Active Problems:   Anxiety and depression   Asthma without status asthmaticus   Essential hypertension   Gastroesophageal reflux disease without esophagitis   Gastritis without bleeding   Diverticulitis   Dehydration   Hypocalcemia  GI bleed in the setting of presumed acute diverticulitis H/H= H/H at 11.5/34.1 (was 8.5/25.8 on 12/31/2020) There was no indication for blood transfusion at this time  Patient was started on IV Zosyn due to presumed diverticulitis Gastroenterologist  will be consulted in the  morning  Abdominal pain in the setting of above Continue IV morphine 2 mg every 4 hours as needed  Gastritis/GERD Continue Protonix  Dehydration Continue IV hydration  Hypocalcemia Ca 8.7, continue Os-Cal  Essential hypertension No anti-hypertensive medication noted in patient's med rec Continue hydralazine 10 mg every 6 hours as needed for SBP > 170  Asthma Continue albuterol as needed  Anxiety and depression Continuing Xanax, bupropion and Lexapro  Hyperlipidemia Continue Lovaza   DVT prophylaxis: SCDs  Code Status: Full code  Family Communication: None at bedside  Disposition Plan:  Patient is from:                        home Anticipated DC to:                   SNF or family members home Anticipated DC date:               2-3 days Anticipated DC barriers:          Patient requires inpatient management due to GI bleed due to diverticulitis requiring IV antibiotics and pending gastroenterology consult    Consults called: Gastroenterology  Admission status: Observation    Bernadette Hoit MD [facility]  06/10/2021, 5:22 PM

## 2021-06-10 NOTE — ED Notes (Signed)
Patient states she needs to void. Patient refused Purewick.

## 2021-06-10 NOTE — ED Notes (Signed)
Patient ambulated to the hallway bathroom with a steady gait.

## 2021-06-10 NOTE — ED Notes (Signed)
Update given to patient's daughter via telephone.

## 2021-06-10 NOTE — ED Triage Notes (Signed)
Pt comes into the ED via POV c/o rectal bleeding that started last night.  Pt states she has had about 8 episodes of bloody stools today since then.  Pt has been admitted for this in the past secondary to diverticulitis.  Pt denies any current abdominal pain but admits to discomfort.  Pt also explains that she is now having dizziness.  Pt currently has even and unlabored respirations and is in NAD.

## 2021-06-11 DIAGNOSIS — K922 Gastrointestinal hemorrhage, unspecified: Secondary | ICD-10-CM | POA: Diagnosis present

## 2021-06-11 DIAGNOSIS — E1122 Type 2 diabetes mellitus with diabetic chronic kidney disease: Secondary | ICD-10-CM | POA: Diagnosis present

## 2021-06-11 DIAGNOSIS — K219 Gastro-esophageal reflux disease without esophagitis: Secondary | ICD-10-CM | POA: Diagnosis present

## 2021-06-11 DIAGNOSIS — J45909 Unspecified asthma, uncomplicated: Secondary | ICD-10-CM | POA: Diagnosis present

## 2021-06-11 DIAGNOSIS — I129 Hypertensive chronic kidney disease with stage 1 through stage 4 chronic kidney disease, or unspecified chronic kidney disease: Secondary | ICD-10-CM | POA: Diagnosis present

## 2021-06-11 DIAGNOSIS — K5792 Diverticulitis of intestine, part unspecified, without perforation or abscess without bleeding: Secondary | ICD-10-CM | POA: Diagnosis present

## 2021-06-11 DIAGNOSIS — K5900 Constipation, unspecified: Secondary | ICD-10-CM | POA: Diagnosis present

## 2021-06-11 DIAGNOSIS — Z881 Allergy status to other antibiotic agents status: Secondary | ICD-10-CM | POA: Diagnosis not present

## 2021-06-11 DIAGNOSIS — E860 Dehydration: Secondary | ICD-10-CM | POA: Diagnosis present

## 2021-06-11 DIAGNOSIS — Z20822 Contact with and (suspected) exposure to covid-19: Secondary | ICD-10-CM | POA: Diagnosis present

## 2021-06-11 DIAGNOSIS — K5791 Diverticulosis of intestine, part unspecified, without perforation or abscess with bleeding: Secondary | ICD-10-CM | POA: Diagnosis present

## 2021-06-11 DIAGNOSIS — Z6830 Body mass index (BMI) 30.0-30.9, adult: Secondary | ICD-10-CM | POA: Diagnosis not present

## 2021-06-11 DIAGNOSIS — N183 Chronic kidney disease, stage 3 unspecified: Secondary | ICD-10-CM | POA: Diagnosis present

## 2021-06-11 DIAGNOSIS — F32A Depression, unspecified: Secondary | ICD-10-CM | POA: Diagnosis present

## 2021-06-11 DIAGNOSIS — Z888 Allergy status to other drugs, medicaments and biological substances status: Secondary | ICD-10-CM | POA: Diagnosis not present

## 2021-06-11 DIAGNOSIS — E669 Obesity, unspecified: Secondary | ICD-10-CM | POA: Diagnosis present

## 2021-06-11 DIAGNOSIS — F419 Anxiety disorder, unspecified: Secondary | ICD-10-CM | POA: Diagnosis present

## 2021-06-11 DIAGNOSIS — Z886 Allergy status to analgesic agent status: Secondary | ICD-10-CM | POA: Diagnosis not present

## 2021-06-11 DIAGNOSIS — Z885 Allergy status to narcotic agent status: Secondary | ICD-10-CM | POA: Diagnosis not present

## 2021-06-11 DIAGNOSIS — Z79899 Other long term (current) drug therapy: Secondary | ICD-10-CM | POA: Diagnosis not present

## 2021-06-11 DIAGNOSIS — K2971 Gastritis, unspecified, with bleeding: Secondary | ICD-10-CM | POA: Diagnosis present

## 2021-06-11 DIAGNOSIS — Z8673 Personal history of transient ischemic attack (TIA), and cerebral infarction without residual deficits: Secondary | ICD-10-CM | POA: Diagnosis not present

## 2021-06-11 DIAGNOSIS — E785 Hyperlipidemia, unspecified: Secondary | ICD-10-CM | POA: Diagnosis present

## 2021-06-11 LAB — COMPREHENSIVE METABOLIC PANEL
ALT: 14 U/L (ref 0–44)
AST: 20 U/L (ref 15–41)
Albumin: 3.6 g/dL (ref 3.5–5.0)
Alkaline Phosphatase: 54 U/L (ref 38–126)
Anion gap: 5 (ref 5–15)
BUN: 26 mg/dL — ABNORMAL HIGH (ref 8–23)
CO2: 25 mmol/L (ref 22–32)
Calcium: 8.6 mg/dL — ABNORMAL LOW (ref 8.9–10.3)
Chloride: 110 mmol/L (ref 98–111)
Creatinine, Ser: 0.98 mg/dL (ref 0.44–1.00)
GFR, Estimated: 59 mL/min — ABNORMAL LOW (ref >60)
Glucose, Bld: 112 mg/dL — ABNORMAL HIGH (ref 70–99)
Potassium: 4.2 mmol/L (ref 3.5–5.1)
Sodium: 140 mmol/L (ref 135–145)
Total Bilirubin: 0.7 mg/dL (ref 0.3–1.2)
Total Protein: 6.6 g/dL (ref 6.5–8.1)

## 2021-06-11 LAB — CBC
HCT: 31.2 % — ABNORMAL LOW (ref 36.0–46.0)
Hemoglobin: 10.2 g/dL — ABNORMAL LOW (ref 12.0–15.0)
MCH: 29.6 pg (ref 26.0–34.0)
MCHC: 32.7 g/dL (ref 30.0–36.0)
MCV: 90.4 fL (ref 80.0–100.0)
Platelets: 332 10*3/uL (ref 150–400)
RBC: 3.45 MIL/uL — ABNORMAL LOW (ref 3.87–5.11)
RDW: 15.9 % — ABNORMAL HIGH (ref 11.5–15.5)
WBC: 6.9 10*3/uL (ref 4.0–10.5)
nRBC: 0 % (ref 0.0–0.2)

## 2021-06-11 LAB — PHOSPHORUS: Phosphorus: 3 mg/dL (ref 2.5–4.6)

## 2021-06-11 LAB — MAGNESIUM: Magnesium: 2.2 mg/dL (ref 1.7–2.4)

## 2021-06-11 MED ORDER — ESCITALOPRAM OXALATE 10 MG PO TABS
10.0000 mg | ORAL_TABLET | Freq: Every day | ORAL | Status: DC
  Administered 2021-06-11 – 2021-06-12 (×2): 10 mg via ORAL
  Filled 2021-06-11 (×2): qty 1

## 2021-06-11 MED ORDER — BUPROPION HCL ER (XL) 150 MG PO TB24
300.0000 mg | ORAL_TABLET | Freq: Every day | ORAL | Status: DC
  Administered 2021-06-11 – 2021-06-12 (×2): 300 mg via ORAL
  Filled 2021-06-11 (×2): qty 2

## 2021-06-11 MED ORDER — OMEGA-3-ACID ETHYL ESTERS 1 G PO CAPS
1.0000 g | ORAL_CAPSULE | Freq: Every day | ORAL | Status: DC
  Administered 2021-06-11 – 2021-06-12 (×2): 1 g via ORAL
  Filled 2021-06-11 (×3): qty 1

## 2021-06-11 MED ORDER — ALPRAZOLAM 0.25 MG PO TABS
0.2500 mg | ORAL_TABLET | Freq: Every evening | ORAL | Status: DC | PRN
  Administered 2021-06-11: 0.25 mg via ORAL
  Filled 2021-06-11: qty 1

## 2021-06-11 MED ORDER — ALBUTEROL SULFATE (2.5 MG/3ML) 0.083% IN NEBU: 2.5000 mg | INHALATION_SOLUTION | RESPIRATORY_TRACT | Status: DC | PRN

## 2021-06-11 MED ORDER — OMEGA-3-ACID ETHYL ESTERS 1 G PO CAPS: 1.0000 g | ORAL_CAPSULE | Freq: Two times a day (BID) | ORAL | Status: DC

## 2021-06-11 MED ORDER — BUPROPION HCL ER (SMOKING DET) 150 MG PO TB12: 150.0000 mg | ORAL_TABLET | Freq: Every day | ORAL | Status: DC

## 2021-06-11 NOTE — ED Notes (Signed)
Report given to Rashida RN

## 2021-06-11 NOTE — Progress Notes (Signed)
PROGRESS NOTE    Jackie Hunter  ITG:[id number] DOB: September 28, 1945 DOA: 06/10/2021 PCP: Tracie Harrier, MD   Chief Complaint  Patient presents with   Rectal Bleeding  Brief Narrative/Hospital Course: Jackie Hunter, 75 y.o. female with PMH of diverticulitis (2019), gastritis (July 2020), GERD,HTN,chronic anxiety/depression, asthma presents to ED due to bloody bowel movements multiple episodes, 8 overnight, 3 in the morning and 2 while in the ED along with dizziness lightheadedness but no chest pain or shortness of breath or abdominal pain. In the ED vitals are stable hemoglobin in the 11 g range with elevated BUN 34 COVID-19 negative CT abdomen pelvis without and with contrast showed diverticular disease of the colon with suspicion for low-level diverticulitis versus sequela of previous diverticulitis.  Patient was placed on IV morphine and Zosyn and admitted GI was consulted   Subjective: Seen this morning.  Reports she has not had bleeding since yesterday evening No nausea vomiting.  Had abdominal pain on the left lower quadrant and improved with morphine.  Assessment & Plan:  Rectal bleeding Acute diverticulitis: Patient with multiple bowel movement with blood and CT scan concerning for possible acute diverticulitis.  Similar episodes in the past-had colonoscopy in June 13 and EGD in June 17.  I have notified GI team, monitor H&H continue current empiric IV antibiotics , Avoid anticoagulants.  Recent Labs  Lab 06/10/21 1051 06/10/21 1415 06/11/21 0752  HGB 11.5* 11.1* 10.2*  HCT 34.1* 34.5* 31.2*   Dehydration- cont ivf  Hypocalcemia:cont oscal  Anxiety/depression: Mood is stable.  Continue bupropion and Celexa.  Asthma stable respiratory status.  Essential hypertension blood pressure is controlled.  GERD-cont ppi  Class I Obesity:Patient's Body mass index is 30.11 kg/m. : Will benefit with PCP follow-up, weight loss  healthy lifestyle and outpatient sleep  evaluation.  DVT prophylaxis: SCDs Start: 06/11/21 0209 Code Status:   Code Status: Full Code Family Communication: plan of care discussed with patient at bedside. Status is: Observation Remains hospitalized for ongoing monitoring of rectal bleeding, GI evaluation. Disposition: Currently not medically stable for discharge. Anticipated Disposition: TBD  Objective: Vitals last 24 hrs: Vitals:   06/11/21 0000 06/11/21 0130 06/11/21 0905 06/11/21 1006  BP:  139/66 (!) 168/74 (!) 118/57  Pulse: 63 (!) 59 71 (!) 58  Resp:  15 18 18   Temp:   (!) 97.5 F (36.4 C) 97.8 F (36.6 C)  TempSrc:   Oral Oral  SpO2: 98% 99% 100% 100%  Weight:      Height:       Weight change:   Intake/Output Summary (Last 24 hours) at 06/11/2021 1045 Last data filed at 06/10/2021 1758 Gross per 24 hour  Intake 42.83 ml  Output --  Net 42.83 ml   Net IO Since Admission: 42.83 mL [06/11/21 1045]   Physical Examination: General exam: Aa0x3,obese, weak,older than stated age. HEENT:Oral mucosa moist, Ear/Nose WNL grossly,dentition normal. Respiratory system: B/l diminished BS, no use of accessory muscle, non tender. Cardiovascular system: S1 & S2 +,No JVD. Gastrointestinal system: Abdomen soft, NT,ND, BS+. Nervous System:Alert, awake, moving extremities. Extremities: edema none, distal peripheral pulses palpable.  Skin: No rashes, no icterus. MSK: Normal muscle bulk, tone, power.  Medications reviewed: Scheduled Meds:  buPROPion  300 mg Oral Daily   calcium carbonate  1 tablet Oral Q breakfast   escitalopram  10 mg Oral Daily   omega-3 acid ethyl esters  1 g Oral Daily   pantoprazole  40 mg Oral Daily   Continuous Infusions:  sodium chloride 75 mL/hr at 06/11/21 0153   piperacillin-tazobactam 3.375 g (06/11/21 0909)   Diet Order             Diet Heart Room service appropriate? Yes; Fluid consistency: Thin  Diet effective now                          Weight change:   Wt Readings  from Last 3 Encounters:  06/10/21 77.1 kg  12/28/20 83.9 kg  09/01/20 81.6 kg     Consultants:see note  Procedures:see note Antimicrobials: Anti-infectives (From admission, onward)    Start     Dose/Rate Route Frequency Ordered Stop   06/11/21 0045  piperacillin-tazobactam (ZOSYN) IVPB 3.375 g        3.375 g 12.5 mL/hr over 240 Minutes Intravenous Every 8 hours 06/10/21 1729     06/10/21 1645  piperacillin-tazobactam (ZOSYN) IVPB 3.375 g        3.375 g 100 mL/hr over 30 Minutes Intravenous  Once 06/10/21 1631 06/10/21 1758      Culture/Microbiology No results found for: SDES, SPECREQUEST, CULT, REPTSTATUS  Other culture-see note  Unresulted Labs (From admission, onward)    None     Data Reviewed: I have personally reviewed following labs and imaging studies CBC: Recent Labs  Lab 06/10/21 1051 06/10/21 1415 06/11/21 0752  WBC 8.3  --  6.9  HGB 11.5* 11.1* 10.2*  HCT 34.1* 34.5* 31.2*  MCV 90.9  --  90.4  PLT 360  --  413   Basic Metabolic Panel: Recent Labs  Lab 06/10/21 1051 06/11/21 0752  NA 135 140  K 3.7 4.2  CL 107 110  CO2 22 25  GLUCOSE 124* 112*  BUN 34* 26*  CREATININE 0.97 0.98  CALCIUM 8.7* 8.6*  MG  --  2.2  PHOS  --  3.0   GFR: Estimated Creatinine Clearance: 47.3 mL/min (by C-G formula based on SCr of 0.98 mg/dL). Liver Function Tests: Recent Labs  Lab 06/10/21 1051 06/11/21 0752  AST 20 20  ALT 16 14  ALKPHOS 60 54  BILITOT 0.7 0.7  PROT 7.2 6.6  ALBUMIN 3.9 3.6   No results for input(s): LIPASE, AMYLASE in the last 168 hours. No results for input(s): AMMONIA in the last 168 hours. Coagulation Profile: No results for input(s): INR, PROTIME in the last 168 hours. Cardiac Enzymes: No results for input(s): CKTOTAL, CKMB, CKMBINDEX, TROPONINI in the last 168 hours. BNP (last 3 results) No results for input(s): PROBNP in the last 8760 hours. HbA1C: No results for input(s): HGBA1C in the last 72 hours. CBG: No results for  input(s): GLUCAP in the last 168 hours. Lipid Profile: No results for input(s): CHOL, HDL, LDLCALC, TRIG, CHOLHDL, LDLDIRECT in the last 72 hours. Thyroid Function Tests: No results for input(s): TSH, T4TOTAL, FREET4, T3FREE, THYROIDAB in the last 72 hours. Anemia Panel: No results for input(s): VITAMINB12, FOLATE, FERRITIN, TIBC, IRON, RETICCTPCT in the last 72 hours. Sepsis Labs: No results for input(s): PROCALCITON, LATICACIDVEN in the last 168 hours.  Recent Results (from the past 240 hour(s))  Resp Panel by RT-PCR (Flu A&B, Covid) Nasopharyngeal Swab     Status: None   Collection Time: 06/10/21  2:15 PM   Specimen: Nasopharyngeal Swab; Nasopharyngeal(NP) swabs in vial transport medium  Result Value Ref Range Status   SARS Coronavirus 2 by RT PCR NEGATIVE NEGATIVE Final    Comment: (NOTE) SARS-CoV-2 target nucleic acids are NOT DETECTED.  The SARS-CoV-2 RNA is generally detectable in upper respiratory specimens during the acute phase of infection. The lowest concentration of SARS-CoV-2 viral copies this assay can detect is 138 copies/mL. A negative result does not preclude SARS-Cov-2 infection and should not be used as the sole basis for treatment or other patient management decisions. A negative result may occur with  improper specimen collection/handling, submission of specimen other than nasopharyngeal swab, presence of viral mutation(s) within the areas targeted by this assay, and inadequate number of viral copies(<138 copies/mL). A negative result must be combined with clinical observations, patient history, and epidemiological information. The expected result is Negative.  Fact Sheet for Patients:  EntrepreneurPulse.com.au  Fact Sheet for Healthcare Providers:  IncredibleEmployment.be  This test is no t yet approved or cleared by the [place] FDA and  has been authorized for detection and/or diagnosis of SARS-CoV-2 by FDA under  an Emergency Use Authorization (EUA). This EUA will remain  in effect (meaning this test can be used) for the duration of the COVID-19 declaration under Section 564(b)(1) of the Act, 21 U.S.C.section 360bbb-3(b)(1), unless the authorization is terminated  or revoked sooner.       Influenza A by PCR NEGATIVE NEGATIVE Final   Influenza B by PCR NEGATIVE NEGATIVE Final    Comment: (NOTE) The Xpert Xpress SARS-CoV-2/FLU/RSV plus assay is intended as an aid in the diagnosis of influenza from Nasopharyngeal swab specimens and should not be used as a sole basis for treatment. Nasal washings and aspirates are unacceptable for Xpert Xpress SARS-CoV-2/FLU/RSV testing.  Fact Sheet for Patients: EntrepreneurPulse.com.au  Fact Sheet for Healthcare Providers: IncredibleEmployment.be  This test is not yet approved or cleared by the [place] FDA and has been authorized for detection and/or diagnosis of SARS-CoV-2 by FDA under an Emergency Use Authorization (EUA). This EUA will remain in effect (meaning this test can be used) for the duration of the COVID-19 declaration under Section 564(b)(1) of the Act, 21 U.S.C. section 360bbb-3(b)(1), unless the authorization is terminated or revoked.  Performed at [facility], [address], [place], [place] [address]      Radiology Studies: CT Angio Abd/Pel W and/or Wo Contrast  Result Date: 06/10/2021 CLINICAL DATA:  75 year old female presents with rectal bleeding. EXAM: CTA ABDOMEN AND PELVIS WITHOUT AND WITH CONTRAST TECHNIQUE: Multidetector CT imaging of the abdomen and pelvis was performed using the standard protocol during bolus administration of intravenous contrast. Multiplanar reconstructed images and MIPs were obtained and reviewed to evaluate the vascular anatomy. CONTRAST:  121mL OMNIPAQUE IOHEXOL 350 MG/ML SOLN COMPARISON:  December 26, 2020. FINDINGS: VASCULAR Aorta: Signs of aortic  atherosclerosis. No perivascular stranding, sign of dissection or acute process. The aorta in the abdomen and lower chest is of normal caliber. Celiac: Patent without evidence of aneurysm, dissection, vasculitis or significant stenosis. SMA: Patent without evidence of aneurysm, dissection, vasculitis or significant stenosis. Renals: Both renal arteries are patent without evidence of aneurysm, dissection or vasculitis. Beaded appearance of the RIGHT renal artery. No substantial dilation of this vessel. Mild atherosclerotic plaque at the origin of the LEFT renal artery. IMA: No sign of stranding or dissection. 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 Proximal Outflow: Bilateral common femoral and visualized portions of the superficial and profunda femoral arteries are patent without evidence of aneurysm, dissection, vasculitis or significant stenosis. Veins: Smooth contour of  the IVC. Patent veins in the abdomen and pelvis. Review of the MIP images confirms the above findings. NON-VASCULAR Lower chest: Unremarkable. Hepatobiliary: No focal, suspicious hepatic lesion. No pericholecystic stranding. No biliary duct dilation. Portal vein is patent. Pancreas: Signs of pancreatic atrophy without ductal dilation or inflammation. Spleen: Normal. Adrenals/Urinary Tract: Adrenal glands are normal. Low-density lesions of the bilateral kidneys likely small cysts. No hydronephrosis. LEFT nephrolithiasis, a 2 mm calculus in the interpolar LEFT kidney. No perinephric stranding. Stomach/Bowel: Signs of colonic diverticulosis. No discrete area of active extravasation. Profound diverticular changes in the sigmoid displayed no substantial change compared to previous imaging. Query mild stranding about the sigmoid, quite subtle findings mainly appear to be indicative of more  chronic diverticular changes. The stomach is unremarkable. Small bowel is normal caliber. The appendix is normal. Lymphatic: There is no gastrohepatic or hepatoduodenal ligament lymphadenopathy. No retroperitoneal or mesenteric lymphadenopathy. No pelvic sidewall lymphadenopathy. Reproductive: Unremarkable by CT. Other: No ascites.  No pneumoperitoneum. Musculoskeletal: No acute bone finding. No destructive bone process. Spinal degenerative changes. IMPRESSION: VASCULAR Signs of aortic atherosclerosis. No acute aortic pathology. Aortic atherosclerosis, no acute vascular findings. Beaded appearance of the RIGHT renal artery raising the question of FMD but without aneurysmal dilation and without change. 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 NON-VASCULAR: Diverticular disease of the colon, query low level diverticulitis versus sequela of previous diverticulitis. No visible area of active bleeding. Renal cysts as before. Electronically Signed   By: Zetta Bills M.D.   On: 06/10/2021 16:16     LOS: 0 days   Antonieta Pert, MD [facility]  06/11/2021, 10:45 AM

## 2021-06-11 NOTE — Consult Note (Signed)
Jackie Hunter , MD [address], [place], [place], [place], [address] [address], [place], [place], [place], [address] Phone: [phone]  Fax: [phone]  Consultation  Referring Provider:  Dr Maren Beach Primary Care Physician:  Tracie Harrier, MD Primary Gastroenterologist:  Dr. Haig Prophet          Reason for Consultation:  GI bleed  Date of Admission:  06/10/2021 Date of Consultation:  06/11/2021         HPI:   Jackie Hunter is a 75 y.o. female who follows with [facility] clinic gastroenterology and had seen Dr. Haig Prophet in June 2022.  Previously admitted in June 2022 with a lower GI bleed colonoscopy was performed which showed blood throughout the entire colon but no evidence of active bleed was a presumed diverticular bleed.  She presented to the emergency room last evening on 06/10/2021 with rectal bleeding.  Multiple bowel movements which were bloody in nature.  Very similar to the episode she had earlier in the year.  In the ER she had hemoglobin of 11.5 g CT abdomen pelvis was performed which showed diverticular disease throughout the colon and concern for possible low-level diverticulitis versus sequela of prior diverticulitis.  Commenced on IV antibiotics for diverticulitis.Hemoglobin this morning is 10.2 g.  MCV of 90.4.  CMP shows no significant elevation in BUN/creatinine ratio.  No bowel movements since yesterday and no further bleeding . No abdominal pain presently but had some lower abdominal cramping yesterday . She had been taking a few alleves daily for the past few days but not on a regular basis.   Past Medical History:  Diagnosis Date   Adenoma of colon    Allergic rhinitis    Anxiety    Arthritis    Asthma    Back pain    Carpal tunnel syndrome    Carpal tunnel syndrome    Carpal tunnel syndrome    Cataract cortical, senile    Cervicalgia    Chronic kidney disease    STAGE 3   CKD (chronic kidney disease)    DDD (degenerative disc disease), lumbar     DDD (degenerative disc disease), lumbar    Diverticulosis    Gastritis    GERD (gastroesophageal reflux disease)    Headache    Hiatal hernia    History of hiatal hernia    Hypertension    Lipids serum increased    Lumbago    Migraines    Obesity    Osteoarthritis    Stroke [facility])    TIA (transient ischemic attack)    Wrist fracture     Past Surgical History:  Procedure Laterality Date   BREAST BIOPSY Right 2014   NEG   BREAST BIOPSY Left 11/24/2016   [place] core, radial scar    BREAST EXCISIONAL BIOPSY Left 1967   NEG   cataracts Bilateral    COLONOSCOPY WITH PROPOFOL N/A 05/03/2016   Procedure: COLONOSCOPY WITH PROPOFOL;  Surgeon: Lollie Sails, MD;  Location: [facility] ENDOSCOPY;  Service: Endoscopy;  Laterality: N/A;   COLONOSCOPY WITH PROPOFOL N/A 11/01/2016   Procedure: COLONOSCOPY WITH PROPOFOL;  Surgeon: Lollie Sails, MD;  Location: [facility] ENDOSCOPY;  Service: Endoscopy;  Laterality: N/A;   COLONOSCOPY WITH PROPOFOL N/A 11/02/2016   Procedure: COLONOSCOPY WITH PROPOFOL;  Surgeon: Lollie Sails, MD;  Location: [facility] ENDOSCOPY;  Service: Endoscopy;  Laterality: N/A;   COLONOSCOPY WITH PROPOFOL N/A 12/01/2017   Procedure: COLONOSCOPY WITH PROPOFOL;  Surgeon: Lollie Sails, MD;  Location: [facility] ENDOSCOPY;  Service: Endoscopy;  Laterality: N/A;   COLONOSCOPY WITH PROPOFOL N/A 03/02/2018   Procedure: COLONOSCOPY WITH PROPOFOL;  Surgeon: Lollie Sails, MD;  Location: [facility] ENDOSCOPY;  Service: Endoscopy;  Laterality: N/A;   COLONOSCOPY WITH PROPOFOL N/A 12/28/2020   Procedure: COLONOSCOPY WITH PROPOFOL;  Surgeon: Lin Landsman, MD;  Location: [facility] ENDOSCOPY;  Service: Gastroenterology;  Laterality: N/A;   ESOPHAGOGASTRODUODENOSCOPY (EGD) WITH PROPOFOL N/A 10/26/2015   Procedure: ESOPHAGOGASTRODUODENOSCOPY (EGD) WITH PROPOFOL;  Surgeon: [name], MD;  Location: [facility] ENDOSCOPY;  Service: Gastroenterology;  Laterality: N/A;   ESOPHAGOGASTRODUODENOSCOPY (EGD) WITH  PROPOFOL N/A 02/12/2019   Procedure: ESOPHAGOGASTRODUODENOSCOPY (EGD) WITH PROPOFOL;  Surgeon: Lollie Sails, MD;  Location: [facility] ENDOSCOPY;  Service: Endoscopy;  Laterality: N/A;   EYE SURGERY     OOPHORECTOMY     left   OOPHORECTOMY Left     Prior to Admission medications   Medication Sig Start Date End Date Taking? Authorizing Provider  acetaminophen (TYLENOL) 325 MG tablet Take 2 tablets (650 mg total) by mouth every 6 (six) hours as needed for mild pain, fever or headache. 12/31/20  Yes Elgergawy, Silver Huguenin, MD  albuterol (VENTOLIN HFA) 108 (90 Base) MCG/ACT inhaler Inhale 2 puffs into the lungs every 4 (four) hours as needed for wheezing or shortness of breath.   Yes [provider]  ALPRAZolam (XANAX) 0.25 MG tablet Take 0.25 mg by mouth at bedtime as needed for anxiety.   Yes [provider]  buPROPion (WELLBUTRIN XL) 300 MG 24 hr tablet Take 300 mg by mouth daily. 03/10/21  Yes [provider]  fexofenadine (ALLEGRA) 60 MG tablet Take 60 mg by mouth daily.   Yes [provider]  fluticasone (FLONASE) 50 MCG/ACT nasal spray Place 2 sprays into both nostrils daily.   Yes [provider]  gabapentin (NEURONTIN) 300 MG capsule Take 300 mg by mouth 3 (three) times daily.   Yes [provider]  losartan-hydrochlorothiazide (HYZAAR) 50-12.5 MG tablet Take 1 tablet by mouth daily. 03/10/21  Yes [provider]  Omega-3 1000 MG CAPS Take 1 capsule by mouth daily.   Yes [provider]  pantoprazole (PROTONIX) 40 MG tablet Take 1 tablet (40 mg total) by mouth daily. 12/31/20 06/11/21 Yes Elgergawy, Silver Huguenin, MD  vitamin B-12 (CYANOCOBALAMIN) 1000 MCG tablet Take 1,000 mcg by mouth daily.   Yes [provider]  vitamin C (ASCORBIC ACID) 500 MG tablet Take 500 mg by mouth daily.   Yes [provider]  Azelastine HCl 137 MCG/SPRAY SOLN Place 1 spray into both nostrils 2 (two) times daily. Patient not taking:  Reported on 06/11/2021 02/14/20   [provider]  buPROPion (ZYBAN) 150 MG 12 hr tablet Take 150 mg by mouth daily. Patient not taking: Reported on 06/11/2021    [provider]  escitalopram (LEXAPRO) 10 MG tablet escitalopram 10 mg tablet Patient not taking: Reported on 06/11/2021    [provider]  FERREX 150 150 MG capsule Take 150 mg by mouth daily. Patient not taking: Reported on 06/11/2021 01/06/21   [provider]  mometasone (NASONEX) 50 MCG/ACT nasal spray Place 2 sprays into the nose daily. Patient not taking: Reported on 06/11/2021    [provider]  montelukast (SINGULAIR) 10 MG tablet Take by mouth. 03/06/18 03/06/19  [provider]  omega-3 acid ethyl esters (LOVAZA) 1 g capsule Take by mouth 2 (two) times daily. Patient not taking: Reported on 06/11/2021    [provider]  solifenacin (VESICARE) 5 MG tablet Take by mouth. Patient not taking: Reported on 06/11/2021 04/29/13   [provider]  sucralfate (CARAFATE) 1 g tablet Take 1 tablet (1 g total) by mouth 4 (four) times daily as needed for up to 15 days. Patient not taking: Reported on 06/11/2021 09/01/20 09/16/20  Arta Silence, MD  terconazole (TERAZOL 3) 80 MG vaginal suppository Place vaginally. Patient not taking: Reported on 06/11/2021 01/01/19   [provider]  traMADol (ULTRAM) 50 MG tablet Take 50 mg by mouth daily as needed. Patient not taking: Reported on 06/11/2021 06/06/21   [provider]  zolpidem (AMBIEN) 5 MG tablet zolpidem 5 mg tablet Patient not taking: Reported on 06/11/2021    [provider]    Family History  Problem Relation Age of Onset   Kidney cancer Neg Hx    Prostate cancer Neg Hx    Breast cancer Neg Hx      Social History   Tobacco Use   Smoking status: Never   Smokeless tobacco: Never  Vaping Use   Vaping Use: Never used  Substance Use Topics   Alcohol use: Yes    Comment:  rarely   Drug use: Never    Allergies as of 06/10/2021 - Review Complete 06/10/2021  Allergen Reaction Noted   Levaquin [levofloxacin in d5w] Other (See Comments) 10/31/2016   Meloxicam  05/02/2016   Codeine Anxiety 03/30/2015   Sucralfate Nausea Only 01/10/2020    Review of Systems:    All systems reviewed and negative except where noted in HPI.   Physical Exam:  Vital signs in last 24 hours: Temp:  [97.5 F (36.4 C)-97.9 F (36.6 C)] 97.8 F (36.6 C) (11/25 1006) Pulse Rate:  [58-81] 58 (11/25 1006) Resp:  [15-20] 18 (11/25 1006) BP: (118-168)/(46-74) 118/57 (11/25 1006) SpO2:  [97 %-100 %] 100 % (11/25 1006) Weight:  [77.1 kg] 77.1 kg (11/24 1056)   General:   Pleasant, cooperative in NAD Head:  Normocephalic and atraumatic. Eyes:   No icterus.   Conjunctiva pink. PERRLA. Ears:  Normal auditory acuity. Neck:  Supple; no masses or thyroidomegaly Lungs: Respirations even and unlabored. Lungs clear to auscultation bilaterally.   No wheezes, crackles, or rhonchi.  Heart:  Regular rate and rhythm;  Without murmur, clicks, rubs or gallops Abdomen:  Soft, nondistended, nontender. Normal bowel sounds. No appreciable masses or hepatomegaly.  No rebound or guarding.  Neurologic:  Alert and oriented x3;  grossly normal neurologically. Skin:  Intact without significant lesions or rashes. Cervical Nodes:  No significant cervical adenopathy. Psych:  Alert and cooperative. Normal affect.  LAB RESULTS: Recent Labs    06/10/21 1051 06/10/21 1415 06/11/21 0752  WBC 8.3  --  6.9  HGB 11.5* 11.1* 10.2*  HCT 34.1* 34.5* 31.2*  PLT 360  --  332   BMET Recent Labs    06/10/21 1051 06/11/21 0752  NA 135 140  K 3.7 4.2  CL 107 110  CO2 22 25  GLUCOSE 124* 112*  BUN 34* 26*  CREATININE 0.97 0.98  CALCIUM 8.7* 8.6*   LFT Recent Labs    06/11/21 0752  PROT 6.6  ALBUMIN 3.6  AST 20  ALT 14  ALKPHOS 54  BILITOT 0.7   PT/INR No results for input(s): LABPROT, INR in  the last 72 hours.  STUDIES: CT Angio Abd/Pel W and/or Wo Contrast  Result Date: 06/10/2021 CLINICAL DATA:  75 year old female presents with rectal bleeding. EXAM: CTA ABDOMEN  AND PELVIS WITHOUT AND WITH CONTRAST TECHNIQUE: Multidetector CT imaging of the abdomen and pelvis was performed using the standard protocol during bolus administration of intravenous contrast. Multiplanar reconstructed images and MIPs were obtained and reviewed to evaluate the vascular anatomy. CONTRAST:  15mL OMNIPAQUE IOHEXOL 350 MG/ML SOLN COMPARISON:  December 26, 2020. FINDINGS: VASCULAR Aorta: Signs of aortic atherosclerosis. No perivascular stranding, sign of dissection or acute process. The aorta in the abdomen and lower chest is of normal caliber. Celiac: Patent without evidence of aneurysm, dissection, vasculitis or significant stenosis. SMA: Patent without evidence of aneurysm, dissection, vasculitis or significant stenosis. Renals: Both renal arteries are patent without evidence of aneurysm, dissection or vasculitis. Beaded appearance of the RIGHT renal artery. No substantial dilation of this vessel. Mild atherosclerotic plaque at the origin of the LEFT renal artery. IMA: No sign of stranding or dissection. 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 Proximal Outflow: Bilateral common femoral and visualized portions of the superficial and profunda femoral arteries are patent without evidence of aneurysm, dissection, vasculitis or significant stenosis. Veins: Smooth contour of the IVC. Patent veins in the abdomen and pelvis. Review of the MIP images confirms the above findings. NON-VASCULAR Lower chest: Unremarkable. Hepatobiliary: No focal, suspicious hepatic lesion. No pericholecystic stranding. No biliary duct dilation. Portal vein is patent. Pancreas: Signs of  pancreatic atrophy without ductal dilation or inflammation. Spleen: Normal. Adrenals/Urinary Tract: Adrenal glands are normal. Low-density lesions of the bilateral kidneys likely small cysts. No hydronephrosis. LEFT nephrolithiasis, a 2 mm calculus in the interpolar LEFT kidney. No perinephric stranding. Stomach/Bowel: Signs of colonic diverticulosis. No discrete area of active extravasation. Profound diverticular changes in the sigmoid displayed no substantial change compared to previous imaging. Query mild stranding about the sigmoid, quite subtle findings mainly appear to be indicative of more chronic diverticular changes. The stomach is unremarkable. Small bowel is normal caliber. The appendix is normal. Lymphatic: There is no gastrohepatic or hepatoduodenal ligament lymphadenopathy. No retroperitoneal or mesenteric lymphadenopathy. No pelvic sidewall lymphadenopathy. Reproductive: Unremarkable by CT. Other: No ascites.  No pneumoperitoneum. Musculoskeletal: No acute bone finding. No destructive bone process. Spinal degenerative changes. IMPRESSION: VASCULAR Signs of aortic atherosclerosis. No acute aortic pathology. Aortic atherosclerosis, no acute vascular findings. Beaded appearance of the RIGHT renal artery raising the question of FMD but without aneurysmal dilation and without change. 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 NON-VASCULAR: Diverticular disease of the colon, query low level diverticulitis versus sequela of previous diverticulitis. No visible area of active bleeding. Renal cysts as before. Electronically Signed   By: Zetta Bills M.D.   On: 06/10/2021 16:16      Impression / Plan:   Jackie Hunter is a 75 y.o. y/o female presents to the emergency room with a history and presentation similar to what she had in June 2022 with a  possible diverticular bleed.  Colonoscopy in 2022 June showed blood throughout the colon and colonic diverticulosis.  On this admission CT angiogram showed no active bleeding.  Hemoglobin has been relatively stable.  There is concern for possible diverticulitis.  Plan 1.  Monitor CBC and transfuse as needed. 2.  If hemoglobin is stable and there is no further evidence of a GI  bleed can consider transitioning to oral antibiotics for diverticulitis.  With active diverticulitis it would be a contraindication to perform any colonoscopy.  At this point of time there is no strong indication to perform a colonoscopy as it was just done a few months back and showed nothing else apart from diverticulosis of the colon.  If there is concern for an active bleed consider CT angiogram or tagged RBC scan and subsequently vascular intervention. 3.  If stable and has a normal bowel movement subsequently can go home tomorrow  and follow-up with her primary gastroenterologist Dr. Haig Prophet as an outpatient 4. Advance diet as tolerated  Thank you for involving me in the care of this patient.      LOS: 0 days   Jackie Bellows, MD  06/11/2021, 10:44 AM

## 2021-06-11 NOTE — Progress Notes (Signed)
   06/11/21 0705  Clinical Encounter Type  Visited With Patient  Visit Type Follow-up;Spiritual support;Social support  Referral From Other (Comment)  Spiritual Encounters  Spiritual Needs Prayer;Emotional  Chaplain Burris returne to check on Pt's well-being. Chaplain provided additional active listening and supportive presence. Chaplain offered appropriate prayer at Pt's request. Provided non-anxious presence and words of encouragement.

## 2021-06-11 NOTE — ED Notes (Signed)
A&O x 4. Ambulatory to bathroom. Gait steady. No signs of distress.

## 2021-06-11 NOTE — Progress Notes (Signed)
   06/11/21 0450  Clinical Encounter Type  Visited With Patient  Visit Type Initial;Spiritual support;Social support  Referral From Other (Comment) (chaplain on unit for code)  [place] engaged Pt who was on Brewing technologist in hallway. Pt described feeling anxious and unsure why she was in hallway and also agitated about heart monitor alarm. Chaplain Burris provided active listening and calm, compassionate presence. Chaplain offered hospitality and facilitated nursing response to check monitor. Chaplain Burris attempted to guide Pt in some deep breathing and to promote her relaxation. Will f/u.

## 2021-06-12 DIAGNOSIS — K922 Gastrointestinal hemorrhage, unspecified: Secondary | ICD-10-CM | POA: Diagnosis not present

## 2021-06-12 LAB — CBC
HCT: 28.7 % — ABNORMAL LOW (ref 36.0–46.0)
Hemoglobin: 9.4 g/dL — ABNORMAL LOW (ref 12.0–15.0)
MCH: 29.6 pg (ref 26.0–34.0)
MCHC: 32.8 g/dL (ref 30.0–36.0)
MCV: 90.3 fL (ref 80.0–100.0)
Platelets: 314 10*3/uL (ref 150–400)
RBC: 3.18 MIL/uL — ABNORMAL LOW (ref 3.87–5.11)
RDW: 15.7 % — ABNORMAL HIGH (ref 11.5–15.5)
WBC: 5.9 10*3/uL (ref 4.0–10.5)
nRBC: 0 % (ref 0.0–0.2)

## 2021-06-12 MED ORDER — DOCUSATE SODIUM 100 MG PO CAPS: 100.0000 mg | ORAL_CAPSULE | Freq: Every day | ORAL | Status: DC

## 2021-06-12 MED ORDER — AMOXICILLIN-POT CLAVULANATE 875-125 MG PO TABS: 1.0000 | ORAL_TABLET | Freq: Two times a day (BID) | ORAL | 0 refills | Status: AC

## 2021-06-12 MED ORDER — SODIUM CHLORIDE 0.9 % IV SOLN: INTRAVENOUS | Status: DC | PRN

## 2021-06-12 MED ORDER — POLYETHYLENE GLYCOL 3350 17 G PO PACK: 17.0000 g | PACK | Freq: Every day | ORAL | Status: DC | PRN

## 2021-06-12 NOTE — Discharge Summary (Addendum)
Physician Discharge Summary  Jackie Hunter HUD:[id number] DOB: Jul 31, 1945 DOA: 06/10/2021  PCP: Tracie Harrier, MD  Admit date: 06/10/2021 Discharge date: 06/12/2021  Admitted From: home Disposition:  home  Recommendations for Outpatient Follow-up:  Follow up with PCP in 1-2 weeks Please obtain BMP/CBC in one week Please follow up on the following pending results:  Home Health:no  Equipment/Devices: none  Discharge Condition: Stable Code Status:   Code Status: Full Code Diet recommendation:  Diet Order             Diet Heart Room service appropriate? Yes; Fluid consistency: Thin  Diet effective now                    Brief/Interim Summary: 75 y.o. female with PMH of diverticulitis (2019), gastritis (July 2020), GERD,HTN,chronic anxiety/depression, asthma presents to ED due to bloody bowel movements multiple episodes, 8 overnight, 3 in the morning and 2 while in the ED along with dizziness lightheadedness but no chest pain or shortness of breath or abdominal pain. In the ED vitals are stable hemoglobin in the 11 g range with elevated BUN 34 COVID-19 negative CT abdomen pelvis without and with contrast showed diverticular disease of the colon with suspicion for low-level diverticulitis versus sequela of previous diverticulitis.  Patient was placed on IV morphine and Zosyn and admitted GI was consulted. Her hemoglobin was monitored and managed with IV antibiotics.  At this time she remained stable tolerating diet no abdominal pain .requesting to go home today.  She is medically stable for d/c home on oral antibiotics, she will have CBC checked in 1 week, PCP and will also follow-up with her GI doctor  Discharge Diagnoses:   Rectal bleeding Acute diverticulitis: Clinically improved.  No more bleeding no bright red blood, hemoglobin relatively stable, abdominal pain resolved tolerating diet.  Seen by GI okay to discharge home follow-up with CBC monitoring PCP on follow-up  with GI doctor.  Discharged on oral Augmentin. Similar episodes in the past-had colonoscopy in June 13 and EGD in January 01 2021.  Baseline hemoglobin 8.7 to 9.1 g about 5 months ago on admission likely elevated 11.5 g due to dehydration, remains at baseline .  Continue constipation management at home.  Follow-up with gastroenterology Recent Labs  Lab 06/10/21 1051 06/10/21 1415 06/11/21 0752 06/12/21 0437  HGB 11.5* 11.1* 10.2* 9.4*  HCT 34.1* 34.5* 31.2* 28.7*    Dehydration-resolved.  Tolerating diet.     Hypocalcemia: Improved   Anxiety/depression: Mood is stable.  Continue bupropion and Celexa.   Asthma stable respiratory status.   Essential hypertension blood pressure is controlled.   GERD-cont ppi   Class I Obesity:Patient's Body mass index is 30.11 kg/m. : Will benefit with PCP follow-up, weight loss  healthy lifestyle and outpatient sleep evaluation  Consults: Gastroenterology  Subjective: Alert awake oriented, tolerating diet, had small bowel movement no fresh blood and had old blood. Complains of constipation  Discharge Exam: Vitals:   06/12/21 0453 06/12/21 0751  BP: (!) 152/65 (!) 150/74  Pulse: 89 72  Resp: 17 18  Temp: 97.7 F (36.5 C) 98.1 F (36.7 C)  SpO2: 100% 99%   General: Pt is alert, awake, not in acute distress Cardiovascular: RRR, S1/S2 +, no rubs, no gallops Respiratory: CTA bilaterally, no wheezing, no rhonchi Abdominal: Soft, NT, ND, bowel sounds + Extremities: no edema, no cyanosis  Discharge Instructions  Discharge Instructions     Discharge instructions   Complete by: As directed  Check CBC in 1 week to make sure hemoglobin remains stable. Follow-up with your gastroenterologist in 1 to 2 weeks but call the office sooner if you have any recurrent GI bleeding abdominal pain fever nausea vomiting  Please call call MD or return to ER for similar or worsening recurring problem that brought you to hospital or if any  fever,nausea/vomiting,abdominal pain, uncontrolled pain, chest pain,  shortness of breath or any other alarming symptoms.  Please follow-up your doctor as instructed in a week time and call the office for appointment.  Please avoid alcohol, smoking, or any other illicit substance and maintain healthy habits including taking your regular medications as prescribed.  You were cared for by a hospitalist during your hospital stay. If you have any questions about your discharge medications or the care you received while you were in the hospital after you are discharged, you can call the unit and ask to speak with the hospitalist on call if the hospitalist that took care of you is not available.  Once you are discharged, your primary care physician will handle any further medical issues. Please note that NO REFILLS for any discharge medications will be authorized once you are discharged, as it is imperative that you return to your primary care physician (or establish a relationship with a primary care physician if you do not have one) for your aftercare needs so that they can reassess your need for medications and monitor your lab values      Allergies as of 06/12/2021       Reactions   Levaquin [levofloxacin In D5w] Other (See Comments)   dizziness   Meloxicam    Codeine Anxiety   Sucralfate Nausea Only        Medication List     STOP taking these medications    Azelastine HCl 137 MCG/SPRAY Soln   buPROPion 150 MG 12 hr tablet Commonly known as: ZYBAN   escitalopram 10 MG tablet Commonly known as: LEXAPRO   Ferrex 150 150 MG capsule Generic drug: iron polysaccharides   mometasone 50 MCG/ACT nasal spray Commonly known as: NASONEX   solifenacin 5 MG tablet Commonly known as: VESICARE   sucralfate 1 g tablet Commonly known as: Carafate   terconazole 80 MG vaginal suppository Commonly known as: TERAZOL 3   traMADol 50 MG tablet Commonly known as: ULTRAM   zolpidem 5 MG  tablet Commonly known as: AMBIEN       TAKE these medications    acetaminophen 325 MG tablet Commonly known as: TYLENOL Take 2 tablets (650 mg total) by mouth every 6 (six) hours as needed for mild pain, fever or headache.   albuterol 108 (90 Base) MCG/ACT inhaler Commonly known as: VENTOLIN HFA Inhale 2 puffs into the lungs every 4 (four) hours as needed for wheezing or shortness of breath.   ALPRAZolam 0.25 MG tablet Commonly known as: XANAX Take 0.25 mg by mouth at bedtime as needed for anxiety.   amoxicillin-clavulanate 875-125 MG tablet Commonly known as: Augmentin Take 1 tablet by mouth 2 (two) times daily for 14 days.   buPROPion 300 MG 24 hr tablet Commonly known as: WELLBUTRIN XL Take 300 mg by mouth daily.   fexofenadine 60 MG tablet Commonly known as: ALLEGRA Take 60 mg by mouth daily.   fluticasone 50 MCG/ACT nasal spray Commonly known as: FLONASE Place 2 sprays into both nostrils daily.   gabapentin 300 MG capsule Commonly known as: NEURONTIN Take 300 mg by mouth 3 (three) times daily.  losartan-hydrochlorothiazide 50-12.5 MG tablet Commonly known as: HYZAAR Take 1 tablet by mouth daily.   montelukast 10 MG tablet Commonly known as: SINGULAIR Take by mouth.   Omega-3 1000 MG Caps Take 1 capsule by mouth daily.   omega-3 acid ethyl esters 1 g capsule Commonly known as: LOVAZA Take by mouth 2 (two) times daily.   pantoprazole 40 MG tablet Commonly known as: Protonix Take 1 tablet (40 mg total) by mouth daily.   vitamin B-12 1000 MCG tablet Commonly known as: CYANOCOBALAMIN Take 1,000 mcg by mouth daily.   vitamin C 500 MG tablet Commonly known as: ASCORBIC ACID Take 500 mg by mouth daily.        Follow-up Information     Tracie Harrier, MD Follow up.   Specialty: Internal Medicine Why: CBC check in 1 week Contact information: [place] [address] [phone]          Lesly Rubenstein, MD Follow up in 1 week(s).   Specialty: Gastroenterology Why: Call the office if you have recurrent bleeding or abdominal pain Contact information: [address] [place] [address] [phone]                Allergies  Allergen Reactions   Levaquin [Levofloxacin In D5w] Other (See Comments)    dizziness   Meloxicam    Codeine Anxiety   Sucralfate Nausea Only    The results of significant diagnostics from this hospitalization (including imaging, microbiology, ancillary and laboratory) are listed below for reference.    Microbiology: Recent Results (from the past 240 hour(s))  Resp Panel by RT-PCR (Flu A&B, Covid) Nasopharyngeal Swab     Status: None   Collection Time: 06/10/21  2:15 PM   Specimen: Nasopharyngeal Swab; Nasopharyngeal(NP) swabs in vial transport medium  Result Value Ref Range Status   SARS Coronavirus 2 by RT PCR NEGATIVE NEGATIVE Final    Comment: (NOTE) SARS-CoV-2 target nucleic acids are NOT DETECTED.  The SARS-CoV-2 RNA is generally detectable in upper respiratory specimens during the acute phase of infection. The lowest concentration of SARS-CoV-2 viral copies this assay can detect is 138 copies/mL. A negative result does not preclude SARS-Cov-2 infection and should not be used as the sole basis for treatment or other patient management decisions. A negative result may occur with  improper specimen collection/handling, submission of specimen other than nasopharyngeal swab, presence of viral mutation(s) within the areas targeted by this assay, and inadequate number of viral copies(<138 copies/mL). A negative result must be combined with clinical observations, patient history, and epidemiological information. The expected result is Negative.  Fact Sheet for Patients:  EntrepreneurPulse.com.au  Fact Sheet for Healthcare Providers:  IncredibleEmployment.be  This test is no t yet  approved or cleared by the [place] FDA and  has been authorized for detection and/or diagnosis of SARS-CoV-2 by FDA under an Emergency Use Authorization (EUA). This EUA will remain  in effect (meaning this test can be used) for the duration of the COVID-19 declaration under Section 564(b)(1) of the Act, 21 U.S.C.section 360bbb-3(b)(1), unless the authorization is terminated  or revoked sooner.       Influenza A by PCR NEGATIVE NEGATIVE Final   Influenza B by PCR NEGATIVE NEGATIVE Final    Comment: (NOTE) The Xpert Xpress SARS-CoV-2/FLU/RSV plus assay is intended as an aid in the diagnosis of influenza from Nasopharyngeal swab specimens and should not be used as a sole basis for treatment. Nasal washings and aspirates  are unacceptable for Xpert Xpress SARS-CoV-2/FLU/RSV testing.  Fact Sheet for Patients: EntrepreneurPulse.com.au  Fact Sheet for Healthcare Providers: IncredibleEmployment.be  This test is not yet approved or cleared by the [place] FDA and has been authorized for detection and/or diagnosis of SARS-CoV-2 by FDA under an Emergency Use Authorization (EUA). This EUA will remain in effect (meaning this test can be used) for the duration of the COVID-19 declaration under Section 564(b)(1) of the Act, 21 U.S.C. section 360bbb-3(b)(1), unless the authorization is terminated or revoked.  Performed at [facility], [address], [place], [place] [address]     Procedures/Studies: CT Angio Abd/Pel W and/or Wo Contrast  Result Date: 06/10/2021 CLINICAL DATA:  75 year old female presents with rectal bleeding. EXAM: CTA ABDOMEN AND PELVIS WITHOUT AND WITH CONTRAST TECHNIQUE: Multidetector CT imaging of the abdomen and pelvis was performed using the standard protocol during bolus administration of intravenous contrast. Multiplanar reconstructed images and MIPs were obtained and reviewed to evaluate the vascular anatomy.  CONTRAST:  167mL OMNIPAQUE IOHEXOL 350 MG/ML SOLN COMPARISON:  December 26, 2020. FINDINGS: VASCULAR Aorta: Signs of aortic atherosclerosis. No perivascular stranding, sign of dissection or acute process. The aorta in the abdomen and lower chest is of normal caliber. Celiac: Patent without evidence of aneurysm, dissection, vasculitis or significant stenosis. SMA: Patent without evidence of aneurysm, dissection, vasculitis or significant stenosis. Renals: Both renal arteries are patent without evidence of aneurysm, dissection or vasculitis. Beaded appearance of the RIGHT renal artery. No substantial dilation of this vessel. Mild atherosclerotic plaque at the origin of the LEFT renal artery. IMA: No sign of stranding or dissection. 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 Proximal Outflow: Bilateral common femoral and visualized portions of the superficial and profunda femoral arteries are patent without evidence of aneurysm, dissection, vasculitis or significant stenosis. Veins: Smooth contour of the IVC. Patent veins in the abdomen and pelvis. Review of the MIP images confirms the above findings. NON-VASCULAR Lower chest: Unremarkable. Hepatobiliary: No focal, suspicious hepatic lesion. No pericholecystic stranding. No biliary duct dilation. Portal vein is patent. Pancreas: Signs of pancreatic atrophy without ductal dilation or inflammation. Spleen: Normal. Adrenals/Urinary Tract: Adrenal glands are normal. Low-density lesions of the bilateral kidneys likely small cysts. No hydronephrosis. LEFT nephrolithiasis, a 2 mm calculus in the interpolar LEFT kidney. No perinephric stranding. Stomach/Bowel: Signs of colonic diverticulosis. No discrete area of active extravasation. Profound diverticular changes in the sigmoid displayed no substantial change compared to  previous imaging. Query mild stranding about the sigmoid, quite subtle findings mainly appear to be indicative of more chronic diverticular changes. The stomach is unremarkable. Small bowel is normal caliber. The appendix is normal. Lymphatic: There is no gastrohepatic or hepatoduodenal ligament lymphadenopathy. No retroperitoneal or mesenteric lymphadenopathy. No pelvic sidewall lymphadenopathy. Reproductive: Unremarkable by CT. Other: No ascites.  No pneumoperitoneum. Musculoskeletal: No acute bone finding. No destructive bone process. Spinal degenerative changes. IMPRESSION: VASCULAR Signs of aortic atherosclerosis. No acute aortic pathology. Aortic atherosclerosis, no acute vascular findings. Beaded appearance of the RIGHT renal artery raising the question of FMD but without aneurysmal dilation and without change. 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 NON-VASCULAR: Diverticular disease of the colon, query low level diverticulitis versus sequela of  previous diverticulitis. No visible area of active bleeding. Renal cysts as before. Electronically Signed   By: Zetta Bills M.D.   On: 06/10/2021 16:16    Labs: BNP (last 3 results) No results for input(s): BNP in the last 8760 hours. Basic Metabolic Panel: Recent Labs  Lab 06/10/21 1051 06/11/21 0752  NA 135 140  K 3.7 4.2  CL 107 110  CO2 22 25  GLUCOSE 124* 112*  BUN 34* 26*  CREATININE 0.97 0.98  CALCIUM 8.7* 8.6*  MG  --  2.2  PHOS  --  3.0   Liver Function Tests: Recent Labs  Lab 06/10/21 1051 06/11/21 0752  AST 20 20  ALT 16 14  ALKPHOS 60 54  BILITOT 0.7 0.7  PROT 7.2 6.6  ALBUMIN 3.9 3.6   No results for input(s): LIPASE, AMYLASE in the last 168 hours. No results for input(s): AMMONIA in the last 168 hours. CBC: Recent Labs  Lab 06/10/21 1051 06/10/21 1415  06/11/21 0752 06/12/21 0437  WBC 8.3  --  6.9 5.9  HGB 11.5* 11.1* 10.2* 9.4*  HCT 34.1* 34.5* 31.2* 28.7*  MCV 90.9  --  90.4 90.3  PLT 360  --  332 314   Cardiac Enzymes: No results for input(s): CKTOTAL, CKMB, CKMBINDEX, TROPONINI in the last 168 hours. BNP: Invalid input(s): POCBNP CBG: No results for input(s): GLUCAP in the last 168 hours. D-Dimer No results for input(s): DDIMER in the last 72 hours. Hgb A1c No results for input(s): HGBA1C in the last 72 hours. Lipid Profile No results for input(s): CHOL, HDL, LDLCALC, TRIG, CHOLHDL, LDLDIRECT in the last 72 hours. Thyroid function studies No results for input(s): TSH, T4TOTAL, T3FREE, THYROIDAB in the last 72 hours.  Invalid input(s): FREET3 Anemia work up No results for input(s): VITAMINB12, FOLATE, FERRITIN, TIBC, IRON, RETICCTPCT in the last 72 hours. Urinalysis    Component Value Date/Time   COLORURINE YELLOW (A) 09/01/2020 0855   APPEARANCEUR HAZY (A) 09/01/2020 0855   APPEARANCEUR Clear 05/27/2020 1359   LABSPEC 1.011 09/01/2020 0855   PHURINE 6.0 09/01/2020 0855   GLUCOSEU NEGATIVE 09/01/2020 0855   HGBUR SMALL (A) 09/01/2020 0855   BILIRUBINUR NEGATIVE 09/01/2020 0855   BILIRUBINUR Negative 05/27/2020 [place] 09/01/2020 0855   PROTEINUR NEGATIVE 09/01/2020 0855   NITRITE NEGATIVE 09/01/2020 0855   LEUKOCYTESUR NEGATIVE 09/01/2020 0855   Sepsis Labs Invalid input(s): PROCALCITONIN,  WBC,  LACTICIDVEN Microbiology Recent Results (from the past 240 hour(s))  Resp Panel by RT-PCR (Flu A&B, Covid) Nasopharyngeal Swab     Status: None   Collection Time: 06/10/21  2:15 PM   Specimen: Nasopharyngeal Swab; Nasopharyngeal(NP) swabs in vial transport medium  Result Value Ref Range Status   SARS Coronavirus 2 by RT PCR NEGATIVE NEGATIVE Final    Comment: (NOTE) SARS-CoV-2 target nucleic acids are NOT DETECTED.  The SARS-CoV-2 RNA is generally detectable in upper respiratory specimens during  the acute phase of infection. The lowest concentration of SARS-CoV-2 viral copies this assay can detect is 138 copies/mL. A negative result does not preclude SARS-Cov-2 infection and should not be used as the sole basis for treatment or other patient management decisions. A negative result may occur with  improper specimen collection/handling, submission of specimen other than nasopharyngeal swab, presence of viral mutation(s) within the areas targeted by this assay, and inadequate number of viral copies(<138 copies/mL). A negative result must be combined with clinical observations, patient history, and epidemiological information. The expected result is Negative.  Fact Sheet for Patients:  EntrepreneurPulse.com.au  Fact Sheet for Healthcare Providers:  IncredibleEmployment.be  This test is no t yet approved or cleared by the [place] FDA and  has been authorized for detection and/or diagnosis of SARS-CoV-2 by FDA under an Emergency Use Authorization (EUA). This EUA will remain  in effect (meaning this test can be used) for the duration of the COVID-19 declaration under Section 564(b)(1) of the Act, 21 U.S.C.section 360bbb-3(b)(1), unless the authorization is terminated  or revoked sooner.       Influenza A by PCR NEGATIVE NEGATIVE Final   Influenza B by PCR NEGATIVE NEGATIVE Final    Comment: (NOTE) The Xpert Xpress SARS-CoV-2/FLU/RSV plus assay is intended as an aid in the diagnosis of influenza from Nasopharyngeal swab specimens and should not be used as a sole basis for treatment. Nasal washings and aspirates are unacceptable for Xpert Xpress SARS-CoV-2/FLU/RSV testing.  Fact Sheet for Patients: EntrepreneurPulse.com.au  Fact Sheet for Healthcare Providers: IncredibleEmployment.be  This test is not yet approved or cleared by the [place] FDA and has been authorized for detection and/or  diagnosis of SARS-CoV-2 by FDA under an Emergency Use Authorization (EUA). This EUA will remain in effect (meaning this test can be used) for the duration of the COVID-19 declaration under Section 564(b)(1) of the Act, 21 U.S.C. section 360bbb-3(b)(1), unless the authorization is terminated or revoked.  Performed at [facility], [address], [place], [place] [address]      Time coordinating discharge: 25 minutes  SIGNED: Antonieta Pert, MD  [facility] 06/12/2021, 10:52 AM  If 7PM-7AM, please contact night-coverage [email or web address]

## 2021-06-12 NOTE — Progress Notes (Signed)
Patient discharge instruction given, all questions answered. IV removed and belongings gathered. Daughter is coming from [place] to pick up patient.

## 2021-06-12 NOTE — Progress Notes (Signed)
Patient's daughter has arrived to transport patient home, discharge instruction also gone over with her, daughters name is Janett Billow.

## 2021-06-12 NOTE — Plan of Care (Signed)

## 2021-11-04 ENCOUNTER — Other Ambulatory Visit: Payer: Self-pay | Admitting: Internal Medicine

## 2021-11-04 DIAGNOSIS — G44329 Chronic post-traumatic headache, not intractable: Secondary | ICD-10-CM

## 2021-11-04 DIAGNOSIS — M542 Cervicalgia: Secondary | ICD-10-CM

## 2021-11-04 DIAGNOSIS — Z87828 Personal history of other (healed) physical injury and trauma: Secondary | ICD-10-CM

## 2021-11-18 ENCOUNTER — Ambulatory Visit
Admission: RE | Admit: 2021-11-18 | Discharge: 2021-11-18 | Disposition: A | Discharge status: Home / Self Care | Payer: Medicare Other | Source: Ambulatory Visit | Attending: Internal Medicine | Admitting: Internal Medicine

## 2021-11-18 DIAGNOSIS — Z87828 Personal history of other (healed) physical injury and trauma: Secondary | ICD-10-CM | POA: Insufficient documentation

## 2021-11-18 DIAGNOSIS — M542 Cervicalgia: Secondary | ICD-10-CM | POA: Insufficient documentation

## 2021-11-18 DIAGNOSIS — G44329 Chronic post-traumatic headache, not intractable: Secondary | ICD-10-CM | POA: Insufficient documentation

## 2021-11-18 MED ORDER — GADOBUTROL 1 MMOL/ML IV SOLN
8.0000 mL | Freq: Once | INTRAVENOUS | Status: AC | PRN
  Administered 2021-11-18: 8 mL via INTRAVENOUS

## 2022-01-06 ENCOUNTER — Inpatient Hospital Stay: Payer: Medicare Other

## 2022-01-06 ENCOUNTER — Inpatient Hospital Stay: Payer: Medicare Other | Attending: Oncology | Admitting: Oncology

## 2022-01-06 ENCOUNTER — Encounter: Payer: Self-pay | Admitting: Oncology

## 2022-01-06 VITALS — BP 167/75 | HR 66 | Temp 97.2°F | Resp 18 | Wt 168.9 lb

## 2022-01-06 DIAGNOSIS — D75839 Thrombocytosis, unspecified: Secondary | ICD-10-CM

## 2022-01-06 LAB — IRON AND TIBC
Iron: 68 ug/dL (ref 28–170)
Saturation Ratios: 16 % (ref 10.4–31.8)
TIBC: 414 ug/dL (ref 250–450)
UIBC: 346 ug/dL

## 2022-01-06 LAB — CBC WITH DIFFERENTIAL/PLATELET
Abs Immature Granulocytes: 0.01 10*3/uL (ref 0.00–0.07)
Basophils Absolute: 0 10*3/uL (ref 0.0–0.1)
Basophils Relative: 1 %
Eosinophils Absolute: 0.7 10*3/uL — ABNORMAL HIGH (ref 0.0–0.5)
Eosinophils Relative: 11 %
HCT: 40.3 % (ref 36.0–46.0)
Hemoglobin: 13 g/dL (ref 12.0–15.0)
Immature Granulocytes: 0 %
Lymphocytes Relative: 38 %
Lymphs Abs: 2.6 10*3/uL (ref 0.7–4.0)
MCH: 28.6 pg (ref 26.0–34.0)
MCHC: 32.3 g/dL (ref 30.0–36.0)
MCV: 88.8 fL (ref 80.0–100.0)
Monocytes Absolute: 0.4 10*3/uL (ref 0.1–1.0)
Monocytes Relative: 6 %
Neutro Abs: 3.1 10*3/uL (ref 1.7–7.7)
Neutrophils Relative %: 44 %
Platelets: 393 10*3/uL (ref 150–400)
RBC: 4.54 MIL/uL (ref 3.87–5.11)
RDW: 16 % — ABNORMAL HIGH (ref 11.5–15.5)
WBC: 6.9 10*3/uL (ref 4.0–10.5)
nRBC: 0 % (ref 0.0–0.2)

## 2022-01-06 LAB — TECHNOLOGIST SMEAR REVIEW
Plt Morphology: NORMAL
RBC MORPHOLOGY: NORMAL
WBC MORPHOLOGY: NORMAL

## 2022-01-06 LAB — FERRITIN: Ferritin: 15 ng/mL (ref 11–307)

## 2022-01-06 NOTE — Progress Notes (Signed)
Pt here to establish care for thrombocytosis. Pt here for niece today.

## 2022-01-07 LAB — PROTEIN ELECTROPHORESIS, SERUM
A/G Ratio: 1.2 (ref 0.7–1.7)
Albumin ELP: 3.8 g/dL (ref 2.9–4.4)
Alpha-1-Globulin: 0.2 g/dL (ref 0.0–0.4)
Alpha-2-Globulin: 0.6 g/dL (ref 0.4–1.0)
Beta Globulin: 1.1 g/dL (ref 0.7–1.3)
Gamma Globulin: 1.3 g/dL (ref 0.4–1.8)
Globulin, Total: 3.1 g/dL (ref 2.2–3.9)
Total Protein ELP: 6.9 g/dL (ref 6.0–8.5)

## 2022-02-07 ENCOUNTER — Ambulatory Visit: Payer: Self-pay | Admitting: Surgery

## 2022-02-07 NOTE — H&P (View-Only) (Signed)
Subjective:   CC: Lipoma of right upper extremity [D17.21]   HPI:  Jackie Hunter is a 76 y.o. female who returns for evaluation of above. Increasing discomfort so will like to proceed with excision.     Past Medical History:  has a past medical history of Allergic rhinitis, Asthma without status asthmaticus, unspecified, Broken wrist, Carpal tunnel syndrome, Cataract cortical, senile, Cervicalgia, CKD (chronic kidney disease) stage 3, GFR 30-59 ml/min, DDD (degenerative disc disease), lumbar, Diverticulosis (05/03/2016), Esophageal reflux, Essential hypertension, benign, Frequency of micturition, Gastritis (10/26/2015), Generalized osteoarthritis of multiple sites, Hiatal hernia, Lumbago, Migraines, Obesity, Osteoarthritis, Other and unspecified hyperlipidemia, TIA (transient ischemic attack) (2006), Tubular adenoma of colon, unspecified (05/03/2016), and Unspecified symptom associated with female genital organs.   Past Surgical History:  has a past surgical history that includes Oophorectomy (Left); Colonoscopy (07/20/09); Extraction Teeth; Colonoscopy (05/03/2016); Upper gastrointestinal endoscopy; egd (10/26/2015); Colonoscopy (11/02/2016); Colonoscopy (12/01/2017); Colonoscopy (03/02/2018); and egd (02/12/2019).   Family History: family history includes High blood pressure (Hypertension) in her mother; Leukemia in her mother; Lung disease in her mother; Migraines in her daughter and mother.   Social History:  reports that she has never smoked. She has never used smokeless tobacco. She reports current alcohol use. She reports that she does not use drugs.   Current Medications: has a current medication list which includes the following prescription(s): alprazolam, bupropion, cyanocobalamin, esomeprazole, fexofenadine, fluticasone propionate, lisinopril-hydrochlorothiazide, and albuterol.   Allergies:      Allergies Allergen Reactions  Codeine Unknown and Other (See Comments)     Increased  heart rate  Codeine Phosphate Other (See Comments)     Difficulty breathing; arrythmia  Levaquin [Levofloxacin] Dizziness  Meloxicam Other (See Comments)     GI upset     ROS:  A 15 point review of systems was performed and pertinent positives and negatives noted in HPI   Objective:   VSS   Constitutional :  alert, appears stated age, cooperative and no distress Lymphatics/Throat:  no asymmetry, masses, or scars Respiratory:  clear to auscultation bilaterally Cardiovascular:  regular rate and rhythm Gastrointestinal: soft, non-tender; bowel sounds normal; no masses,  no organomegaly.   Musculoskeletal: Steady gait and movement Skin: Cool and moist.  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 Lipoma of right upper extremity [D17.21] Discussed surgical excision.  Alternatives include continued observation.  Benefits include possible symptom relief, pathologic evaluation, improved cosmesis. Discussed the risk of surgery including recurrence, chronic pain, post-op infxn, poor cosmesis, poor/delayed wound healing, and possible re-operation to address said risks.  The patient verbalized understanding and all questions were answered to the patient's satisfaction.   Will proceed with excision in OR due to increased size  now. Left lateral decubitus positioning

## 2022-02-07 NOTE — H&P (Signed)
Subjective:   CC: Lipoma of right upper extremity [D17.21]   HPI:  Jackie Hunter is a 76 y.o. female who returns for evaluation of above. Increasing discomfort so will like to proceed with excision.     Past Medical History:  has a past medical history of Allergic rhinitis, Asthma without status asthmaticus, unspecified, Broken wrist, Carpal tunnel syndrome, Cataract cortical, senile, Cervicalgia, CKD (chronic kidney disease) stage 3, GFR 30-59 ml/min, DDD (degenerative disc disease), lumbar, Diverticulosis (05/03/2016), Esophageal reflux, Essential hypertension, benign, Frequency of micturition, Gastritis (10/26/2015), Generalized osteoarthritis of multiple sites, Hiatal hernia, Lumbago, Migraines, Obesity, Osteoarthritis, Other and unspecified hyperlipidemia, TIA (transient ischemic attack) (2006), Tubular adenoma of colon, unspecified (05/03/2016), and Unspecified symptom associated with female genital organs.   Past Surgical History:  has a past surgical history that includes Oophorectomy (Left); Colonoscopy (07/20/09); Extraction Teeth; Colonoscopy (05/03/2016); Upper gastrointestinal endoscopy; egd (10/26/2015); Colonoscopy (11/02/2016); Colonoscopy (12/01/2017); Colonoscopy (03/02/2018); and egd (02/12/2019).   Family History: family history includes High blood pressure (Hypertension) in her mother; Leukemia in her mother; Lung disease in her mother; Migraines in her daughter and mother.   Social History:  reports that she has never smoked. She has never used smokeless tobacco. She reports current alcohol use. She reports that she does not use drugs.   Current Medications: has a current medication list which includes the following prescription(s): alprazolam, bupropion, cyanocobalamin, esomeprazole, fexofenadine, fluticasone propionate, lisinopril-hydrochlorothiazide, and albuterol.   Allergies:      Allergies Allergen Reactions  Codeine Unknown and Other (See Comments)     Increased  heart rate  Codeine Phosphate Other (See Comments)     Difficulty breathing; arrythmia  Levaquin [Levofloxacin] Dizziness  Meloxicam Other (See Comments)     GI upset     ROS:  A 15 point review of systems was performed and pertinent positives and negatives noted in HPI   Objective:   VSS   Constitutional :  alert, appears stated age, cooperative and no distress Lymphatics/Throat:  no asymmetry, masses, or scars Respiratory:  clear to auscultation bilaterally Cardiovascular:  regular rate and rhythm Gastrointestinal: soft, non-tender; bowel sounds normal; no masses,  no organomegaly.   Musculoskeletal: Steady gait and movement Skin: Cool and moist.  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 Lipoma of right upper extremity [D17.21] Discussed surgical excision.  Alternatives include continued observation.  Benefits include possible symptom relief, pathologic evaluation, improved cosmesis. Discussed the risk of surgery including recurrence, chronic pain, post-op infxn, poor cosmesis, poor/delayed wound healing, and possible re-operation to address said risks.  The patient verbalized understanding and all questions were answered to the patient's satisfaction.   Will proceed with excision in OR due to increased size  now. Left lateral decubitus positioning

## 2022-02-10 ENCOUNTER — Other Ambulatory Visit: Payer: Self-pay

## 2022-02-10 ENCOUNTER — Encounter
Admission: RE | Admit: 2022-02-10 | Discharge: 2022-02-10 | Disposition: A | Discharge status: Home / Self Care | Payer: Medicare Other | Source: Ambulatory Visit | Attending: Surgery | Admitting: Surgery

## 2022-02-10 DIAGNOSIS — Z01812 Encounter for preprocedural laboratory examination: Secondary | ICD-10-CM

## 2022-02-10 DIAGNOSIS — I10 Essential (primary) hypertension: Secondary | ICD-10-CM

## 2022-02-10 HISTORY — DX: Type 2 diabetes mellitus without complications: E11.9

## 2022-02-10 HISTORY — DX: Anemia, unspecified: D64.9

## 2022-02-10 NOTE — Patient Instructions (Addendum)
Your procedure is scheduled on: 02/17/22 - Thursday Report to the Registration Desk on the 1st floor of the [facility]. To find out your arrival time, please call [phone] between 1PM - 3PM on: 02/16/22 - Wednesday If your arrival time is 6:00 am, do not arrive prior to that time as the [facility] entrance doors do not open until 6:00 am.  REMEMBER: Instructions that are not followed completely may result in serious medical risk, up to and including death; or upon the discretion of your surgeon and anesthesiologist your surgery may need to be rescheduled.  Do not eat food after midnight the night before surgery.  No gum chewing, lozengers or hard candies.  You may however, drink CLEAR liquids up to 2 hours before you are scheduled to arrive for your surgery. Do not drink anything within 2 hours of your scheduled arrival time.  Clear liquids include: - water  - apple juice without pulp - gatorade (not RED colors) - black coffee or tea (Do NOT add milk or creamers to the coffee or tea) Do NOT drink anything that is not on this list.  TAKE THESE MEDICATIONS THE MORNING OF SURGERY WITH A SIP OF WATER:  - ARIPiprazole (ABILIFY) - pantoprazole (PROTONIX)   One week prior to surgery: Stop Anti-inflammatories (NSAIDS) such as Advil, Aleve, Ibuprofen, Motrin, Naproxen, Naprosyn and Aspirin based products such as Excedrin, Goodys Powder, BC Powder.  Stop ANY OVER THE COUNTER supplements until after surgery., vitamin C (ASCORBIC ACID) , Omega-3.BLACK ELDERBERRY , CRANBERRY PLUS VITAMIN C, aspirin , Hemp-Seed Oil.  You may however, continue to take Tylenol if needed for pain up until the day of surgery.  No Alcohol for 24 hours before or after surgery.  No Smoking including e-cigarettes for 24 hours prior to surgery.  No chewable tobacco products for at least 6 hours prior to surgery.  No nicotine patches on the day of surgery.  Do not use any "recreational" drugs for at least a  week prior to your surgery.  Please be advised that the combination of cocaine and anesthesia may have negative outcomes, up to and including death. If you test positive for cocaine, your surgery will be cancelled.  On the morning of surgery brush your teeth with toothpaste and water, you may rinse your mouth with mouthwash if you wish. Do not swallow any toothpaste or mouthwash.  Do not wear jewelry, make-up, hairpins, clips or nail polish.  Do not wear lotions, powders, or perfumes.   Do not shave body from the neck down 48 hours prior to surgery just in case you cut yourself which could leave a site for infection.  Also, freshly shaved skin may become irritated if using the CHG soap.  Contact lenses, hearing aids and dentures may not be worn into surgery.  Do not bring valuables to the hospital. [facility] is not responsible for any missing/lost belongings or valuables.   Notify your doctor if there is any change in your medical condition (cold, fever, infection).  Wear comfortable clothing (specific to your surgery type) to the hospital.  After surgery, you can help prevent lung complications by doing breathing exercises.  Take deep breaths and cough every 1-2 hours. Your doctor may order a device called an Incentive Spirometer to help you take deep breaths. When coughing or sneezing, hold a pillow firmly against your incision with both hands. This is called "splinting." Doing this helps protect your incision. It also decreases belly discomfort.  If you are  being admitted to the hospital overnight, leave your suitcase in the car. After surgery it may be brought to your room.  If you are being discharged the day of surgery, you will not be allowed to drive home. You will need a responsible adult (18 years or older) to drive you home and stay with you that night.   If you are taking public transportation, you will need to have a responsible adult (18 years or older) with  you. Please confirm with your physician that it is acceptable to use public transportation.   Please call the [facility] at [phone] if you have any questions about these instructions.  Surgery Visitation Policy:  Patients undergoing a surgery or procedure may have two family members or support persons with them as long as the person is not COVID-19 positive or experiencing its symptoms.   Inpatient Visitation:    Visiting hours are 7 a.m. to 8 p.m. Up to four visitors are allowed at one time in a patient room, including children. The visitors may rotate out with other people during the day. One designated support person (adult) may remain overnight.

## 2022-02-16 MED ORDER — ORAL CARE MOUTH RINSE: 15.0000 mL | Freq: Once | OROMUCOSAL | Status: AC

## 2022-02-16 MED ORDER — CHLORHEXIDINE GLUCONATE 0.12 % MT SOLN: 15.0000 mL | Freq: Once | OROMUCOSAL | Status: AC

## 2022-02-16 MED ORDER — CHLORHEXIDINE GLUCONATE CLOTH 2 % EX PADS: 6.0000 | MEDICATED_PAD | Freq: Once | CUTANEOUS | Status: DC

## 2022-02-16 MED ORDER — LACTATED RINGERS IV SOLN: INTRAVENOUS | Status: DC

## 2022-02-16 MED ORDER — CEFAZOLIN SODIUM-DEXTROSE 2-4 GM/100ML-% IV SOLN
2.0000 g | INTRAVENOUS | Status: AC
  Administered 2022-02-17: 2 g via INTRAVENOUS

## 2022-02-17 ENCOUNTER — Ambulatory Visit
Admission: RE | Admit: 2022-02-17 | Discharge: 2022-02-17 | Disposition: A | Discharge status: Home / Self Care | Payer: Medicare Other | Attending: Surgery | Admitting: Surgery

## 2022-02-17 ENCOUNTER — Encounter: Payer: Self-pay | Admitting: Surgery

## 2022-02-17 ENCOUNTER — Encounter
Admission: RE | Disposition: A | Discharge status: Home / Self Care | Payer: Self-pay | Source: Home / Self Care | Attending: Surgery

## 2022-02-17 ENCOUNTER — Other Ambulatory Visit: Payer: Self-pay

## 2022-02-17 ENCOUNTER — Ambulatory Visit: Payer: Medicare Other | Admitting: Certified Registered Nurse Anesthetist

## 2022-02-17 ENCOUNTER — Ambulatory Visit: Payer: Medicare Other | Admitting: Urgent Care

## 2022-02-17 DIAGNOSIS — I129 Hypertensive chronic kidney disease with stage 1 through stage 4 chronic kidney disease, or unspecified chronic kidney disease: Secondary | ICD-10-CM | POA: Diagnosis not present

## 2022-02-17 DIAGNOSIS — D1721 Benign lipomatous neoplasm of skin and subcutaneous tissue of right arm: Secondary | ICD-10-CM | POA: Diagnosis present

## 2022-02-17 DIAGNOSIS — I10 Essential (primary) hypertension: Secondary | ICD-10-CM

## 2022-02-17 DIAGNOSIS — K219 Gastro-esophageal reflux disease without esophagitis: Secondary | ICD-10-CM | POA: Insufficient documentation

## 2022-02-17 DIAGNOSIS — E1122 Type 2 diabetes mellitus with diabetic chronic kidney disease: Secondary | ICD-10-CM | POA: Insufficient documentation

## 2022-02-17 DIAGNOSIS — E669 Obesity, unspecified: Secondary | ICD-10-CM | POA: Diagnosis not present

## 2022-02-17 DIAGNOSIS — N183 Chronic kidney disease, stage 3 unspecified: Secondary | ICD-10-CM | POA: Insufficient documentation

## 2022-02-17 DIAGNOSIS — Z01812 Encounter for preprocedural laboratory examination: Secondary | ICD-10-CM

## 2022-02-17 HISTORY — PX: LIPOMA EXCISION: SHX5283

## 2022-02-17 LAB — GLUCOSE, CAPILLARY: Glucose-Capillary: 77 mg/dL (ref 70–99)

## 2022-02-17 SURGERY — EXCISION LIPOMA
Anesthesia: General | Site: Arm Upper | Laterality: Right

## 2022-02-17 MED ORDER — 0.9 % SODIUM CHLORIDE (POUR BTL) OPTIME
TOPICAL | Status: DC | PRN
  Administered 2022-02-17: 500 mL

## 2022-02-17 MED ORDER — BUPIVACAINE-EPINEPHRINE 0.5% -1:200000 IJ SOLN
INTRAMUSCULAR | Status: DC | PRN
  Administered 2022-02-17: 15 mL

## 2022-02-17 MED ORDER — FENTANYL CITRATE (PF) 100 MCG/2ML IJ SOLN
INTRAMUSCULAR | Status: DC | PRN
  Administered 2022-02-17 (×2): 50 ug via INTRAVENOUS

## 2022-02-17 MED ORDER — OXYCODONE HCL 5 MG PO TABS: 5.0000 mg | ORAL_TABLET | Freq: Once | ORAL | Status: DC | PRN

## 2022-02-17 MED ORDER — TRAMADOL HCL 50 MG PO TABS: 50.0000 mg | ORAL_TABLET | Freq: Four times a day (QID) | ORAL | 0 refills | Status: DC | PRN

## 2022-02-17 MED ORDER — LIDOCAINE HCL (PF) 1 % IJ SOLN
INTRAMUSCULAR | Status: AC
  Filled 2022-02-17: qty 30

## 2022-02-17 MED ORDER — FENTANYL CITRATE (PF) 100 MCG/2ML IJ SOLN
INTRAMUSCULAR | Status: AC
  Filled 2022-02-17: qty 2

## 2022-02-17 MED ORDER — ACETAMINOPHEN 10 MG/ML IV SOLN
INTRAVENOUS | Status: DC | PRN
  Administered 2022-02-17: 1000 mg via INTRAVENOUS

## 2022-02-17 MED ORDER — DOCUSATE SODIUM 100 MG PO CAPS: 100.0000 mg | ORAL_CAPSULE | Freq: Two times a day (BID) | ORAL | 0 refills | Status: AC | PRN

## 2022-02-17 MED ORDER — LIDOCAINE HCL (PF) 1 % IJ SOLN
INTRAMUSCULAR | Status: DC | PRN
  Administered 2022-02-17: 15 mg

## 2022-02-17 MED ORDER — PROPOFOL 500 MG/50ML IV EMUL
INTRAVENOUS | Status: DC | PRN
  Administered 2022-02-17: 100 ug/kg/min via INTRAVENOUS

## 2022-02-17 MED ORDER — MIDAZOLAM HCL 2 MG/2ML IJ SOLN
INTRAMUSCULAR | Status: AC
  Filled 2022-02-17: qty 2

## 2022-02-17 MED ORDER — CEFAZOLIN SODIUM-DEXTROSE 2-4 GM/100ML-% IV SOLN
INTRAVENOUS | Status: AC
  Filled 2022-02-17: qty 100

## 2022-02-17 MED ORDER — PROPOFOL 10 MG/ML IV BOLUS
INTRAVENOUS | Status: AC
  Filled 2022-02-17: qty 20

## 2022-02-17 MED ORDER — EPHEDRINE SULFATE (PRESSORS) 50 MG/ML IJ SOLN
INTRAMUSCULAR | Status: DC | PRN
  Administered 2022-02-17: 5 mg via INTRAVENOUS

## 2022-02-17 MED ORDER — FENTANYL CITRATE (PF) 100 MCG/2ML IJ SOLN: 25.0000 ug | INTRAMUSCULAR | Status: DC | PRN

## 2022-02-17 MED ORDER — BUPIVACAINE-EPINEPHRINE (PF) 0.5% -1:200000 IJ SOLN
INTRAMUSCULAR | Status: AC
Start: 2022-02-17 — End: ?
  Filled 2022-02-17: qty 30

## 2022-02-17 MED ORDER — OXYCODONE HCL 5 MG/5ML PO SOLN: 5.0000 mg | Freq: Once | ORAL | Status: DC | PRN

## 2022-02-17 MED ORDER — ACETAMINOPHEN 10 MG/ML IV SOLN
INTRAVENOUS | Status: AC
Start: 2022-02-17 — End: ?
  Filled 2022-02-17: qty 100

## 2022-02-17 MED ORDER — PROMETHAZINE HCL 25 MG/ML IJ SOLN: 6.2500 mg | INTRAMUSCULAR | Status: DC | PRN

## 2022-02-17 MED ORDER — EPHEDRINE 5 MG/ML INJ
INTRAVENOUS | Status: AC
  Filled 2022-02-17: qty 5

## 2022-02-17 MED ORDER — ACETAMINOPHEN 325 MG PO TABS: 650.0000 mg | ORAL_TABLET | Freq: Three times a day (TID) | ORAL | 0 refills | Status: AC | PRN

## 2022-02-17 MED ORDER — PHENYLEPHRINE 80 MCG/ML (10ML) SYRINGE FOR IV PUSH (FOR BLOOD PRESSURE SUPPORT)
PREFILLED_SYRINGE | INTRAVENOUS | Status: AC
Start: 2022-02-17 — End: ?
  Filled 2022-02-17: qty 10

## 2022-02-17 MED ORDER — PHENYLEPHRINE HCL (PRESSORS) 10 MG/ML IV SOLN
INTRAVENOUS | Status: DC | PRN
  Administered 2022-02-17 (×2): 160 ug via INTRAVENOUS

## 2022-02-17 MED ORDER — ACETAMINOPHEN 10 MG/ML IV SOLN: 1000.0000 mg | Freq: Once | INTRAVENOUS | Status: DC | PRN

## 2022-02-17 MED ORDER — MIDAZOLAM HCL 2 MG/2ML IJ SOLN
INTRAMUSCULAR | Status: DC | PRN
  Administered 2022-02-17: 1 mg via INTRAVENOUS

## 2022-02-17 MED ORDER — CHLORHEXIDINE GLUCONATE 0.12 % MT SOLN
OROMUCOSAL | Status: AC
  Administered 2022-02-17: 15 mL via OROMUCOSAL
  Filled 2022-02-17: qty 15

## 2022-02-17 SURGICAL SUPPLY — 33 items
BLADE SURG 15 STRL LF DISP TIS (BLADE) ×1 IMPLANT
BLADE SURG 15 STRL SS (BLADE) ×1
CHLORAPREP W/TINT 26 (MISCELLANEOUS) ×2 IMPLANT
DERMABOND ADVANCED (GAUZE/BANDAGES/DRESSINGS) ×1
DERMABOND ADVANCED .7 DNX12 (GAUZE/BANDAGES/DRESSINGS) ×1 IMPLANT
DRAPE 3/4 80X56 (DRAPES) ×2 IMPLANT
DRAPE LAPAROTOMY 100X77 ABD (DRAPES) ×2 IMPLANT
ELECT CAUTERY BLADE 6.4 (BLADE) ×2 IMPLANT
ELECT REM PT RETURN 9FT ADLT (ELECTROSURGICAL) ×2
ELECTRODE REM PT RTRN 9FT ADLT (ELECTROSURGICAL) ×1 IMPLANT
GAUZE 4X4 16PLY ~~LOC~~+RFID DBL (SPONGE) ×2 IMPLANT
GLOVE BIOGEL PI IND STRL 7.0 (GLOVE) ×1 IMPLANT
GLOVE BIOGEL PI INDICATOR 7.0 (GLOVE) ×1
GLOVE SURG SYN 6.5 ES PF (GLOVE) ×2 IMPLANT
GLOVE SURG SYN 6.5 PF PI (GLOVE) ×1 IMPLANT
GOWN STRL REUS W/ TWL LRG LVL3 (GOWN DISPOSABLE) ×2 IMPLANT
GOWN STRL REUS W/TWL LRG LVL3 (GOWN DISPOSABLE) ×2
KIT TURNOVER KIT A (KITS) ×2 IMPLANT
LABEL OR SOLS (LABEL) ×2 IMPLANT
MANIFOLD NEPTUNE II (INSTRUMENTS) ×2 IMPLANT
NEEDLE HYPO 22GX1.5 SAFETY (NEEDLE) ×2 IMPLANT
NS IRRIG 1000ML POUR BTL (IV SOLUTION) ×2 IMPLANT
PACK BASIN MINOR ARMC (MISCELLANEOUS) ×2 IMPLANT
SUT ETHILON 3-0 FS-10 30 BLK (SUTURE)
SUT MNCRL 4-0 (SUTURE) ×1
SUT MNCRL 4-0 27XMFL (SUTURE) ×1
SUT VIC AB 3-0 SH 27 (SUTURE) ×1
SUT VIC AB 3-0 SH 27X BRD (SUTURE) ×1 IMPLANT
SUTURE EHLN 3-0 FS-10 30 BLK (SUTURE) IMPLANT
SUTURE MNCRL 4-0 27XMF (SUTURE) ×1 IMPLANT
SYR 30ML LL (SYRINGE) ×2 IMPLANT
TOWEL OR 17X26 4PK STRL BLUE (TOWEL DISPOSABLE) ×2 IMPLANT
WATER STERILE IRR 500ML POUR (IV SOLUTION) ×1 IMPLANT

## 2022-02-17 NOTE — Discharge Instructions (Addendum)
Removal, Care After This sheet gives you information about how to care for yourself after your procedure. Your health care provider may also give you more specific instructions. If you have problems or questions, contact your health care provider. What can I expect after the procedure? After the procedure, it is common to have: Soreness. Bruising. Itching. Follow these instructions at home: site care Follow instructions from your health care provider about how to take care of your site. Make sure you: Wash your hands with soap and water before and after you change your bandage (dressing). If soap and water are not available, use hand sanitizer. Leave stitches (sutures), skin glue, or adhesive strips in place. These skin closures may need to stay in place for 2 weeks or longer. If adhesive strip edges start to loosen and curl up, you may trim the loose edges. Do not remove adhesive strips completely unless your health care provider tells you to do that. If the area bleeds or bruises, apply gentle pressure for 10 minutes. OK TO SHOWER IN 24HRS  Check your site every day for signs of infection. Check for: Redness, swelling, or pain. Fluid or blood. Warmth. Pus or a bad smell.  General instructions Rest and then return to your normal activities as told by your health care provider.  tylenol and advil as needed for discomfort.  Please alternate between the two every four hours as needed for pain.    Use narcotics, if prescribed, only when tylenol and motrin is not enough to control pain.  325-650mg every 8hrs to max of 3000mg/24hrs (including the 325mg in every norco dose) for the tylenol.    Advil up to 800mg per dose every 8hrs as needed for pain.   Keep all follow-up visits as told by your health care provider. This is important. Contact a health care provider if: You have redness, swelling, or pain around your site. You have fluid or blood coming from your site. Your site feels warm to  the touch. You have pus or a bad smell coming from your site. You have a fever. Your sutures, skin glue, or adhesive strips loosen or come off sooner than expected. Get help right away if: You have bleeding that does not stop with pressure or a dressing. Summary After the procedure, it is common to have some soreness, bruising, and itching at the site. Follow instructions from your health care provider about how to take care of your site. Check your site every day for signs of infection. Contact a health care provider if you have redness, swelling, or pain around your site, or your site feels warm to the touch. Keep all follow-up visits as told by your health care provider. This is important. This information is not intended to replace advice given to you by your health care provider. Make sure you discuss any questions you have with your health care provider. Document Released: 07/31/2015 Document Revised: 01/01/2018 Document Reviewed: 01/01/2018 Elsevier Interactive Patient Education  2019 [employer]  AMBULATORY SURGERY  DISCHARGE INSTRUCTIONS   The drugs that you were given will stay in your system until tomorrow so for the next 24 hours you should not:  Drive an automobile Make any legal decisions Drink any alcoholic beverage   You may resume regular meals tomorrow.  Today it is better to start with liquids and gradually work up to solid foods.  You may eat anything you prefer, but it is better to start with liquids, then soup and crackers, and gradually   work up to solid foods.   Please notify your doctor immediately if you have any unusual bleeding, trouble breathing, redness and pain at the surgery site, drainage, fever, or pain not relieved by medication.    Your post-operative visit with Dr.                                       is: Date:                        Time:    Please call to schedule your post-operative visit.  Additional Instructions: 

## 2022-02-17 NOTE — Interval H&P Note (Signed)
History and Physical Interval Note:  02/17/2022 3:37 PM  Jackie Hunter  has presented today for surgery, with the diagnosis of Lipoma of right upper extremity D17.21.  The various methods of treatment have been discussed with the patient and family. After consideration of risks, benefits and other options for treatment, the patient has consented to  Procedure(s): EXCISION LIPOMA (Right) as a surgical intervention.  The patient's history has been reviewed, patient examined, no change in status, stable for surgery.  I have reviewed the patient's chart and labs.  Questions were answered to the patient's satisfaction.     Hoyt Leanos Lysle Pearl

## 2022-02-17 NOTE — Anesthesia Postprocedure Evaluation (Signed)
Anesthesia Post Note  Patient: Jackie Hunter  Procedure(s) Performed: EXCISION LIPOMA (Right: Arm Upper)  Patient location during evaluation: PACU Anesthesia Type: General Level of consciousness: awake and alert Pain management: pain level controlled Vital Signs Assessment: post-procedure vital signs reviewed and stable Respiratory status: spontaneous breathing, nonlabored ventilation, respiratory function stable and patient connected to nasal cannula oxygen Cardiovascular status: blood pressure returned to baseline and stable Postop Assessment: no apparent nausea or vomiting Anesthetic complications: no   No notable events documented.   Last Vitals:  Vitals:   02/17/22 1745 02/17/22 1800  BP: (!) 126/58 (!) 145/67  Pulse: 65 (!) 59  Resp: 12 16  Temp:  (!) 36.4 C  SpO2: 99% 100%    Last Pain:  Vitals:   02/17/22 1800  PainSc: 0-No pain                 Precious Haws Pamelyn Bancroft

## 2022-02-17 NOTE — Op Note (Addendum)
Pre-Op Dx: right upper arm lipoma Post-Op Dx: same Anesthesia: MAC EBL: 60FU Complications:  none apparent Specimen: lipoma Procedure: excisional biopsy of right upper arm lipoma Surgeon: Lysle Pearl  Indications for procedure: See H&P  Description of Procedure:  Consent obtained, time out performed.  Patient placed in left lateral position.  Area sterilized and draped in usual position.  Local infused to area previously marked.  5cm incision made through dermis with 15blade and lipoma noted in subcutaneous layer.  The  8cm x 7.3cm x 7cm lipoma then removed from surrounding tissue completely using electrocautery, passed off field pending pathology.  Wound hemostasis noted, then closed in two layer fashion with 3-0 vicryl in interrupted fashion for deep dermal layer, then running 4-0 monocryl in subcuticular fashion for epidermal layer.  Wound then dressed with dermabond.  Pt tolerated procedure well, and transferred to PACU in stable condition. Sponge and instrument count correct at end of procedure.

## 2022-02-17 NOTE — Anesthesia Preprocedure Evaluation (Signed)
Anesthesia Evaluation  Patient identified by MRN, date of birth, ID band Patient awake    Reviewed: Allergy & Precautions, H&P , NPO status , Patient's Chart, lab work & pertinent test results, reviewed documented beta blocker date and time   History of Anesthesia Complications Negative for: history of anesthetic complications  Airway Mallampati: III   Neck ROM: full    Dental  (+) Poor Dentition, Teeth Intact   Pulmonary neg pulmonary ROS, neg shortness of breath, neg COPD, neg recent URI,    Pulmonary exam normal        Cardiovascular Exercise Tolerance: Good hypertension, (-) angina(-) CAD, (-) Past MI, (-) Cardiac Stents and (-) CABG Normal cardiovascular exam(-) dysrhythmias (-) Valvular Problems/Murmurs     Neuro/Psych  Headaches, neg Seizures PSYCHIATRIC DISORDERS Anxiety Depression TIA Neuromuscular disease CVA, No Residual Symptoms    GI/Hepatic Neg liver ROS, hiatal hernia, GERD  Controlled,  Endo/Other  diabetesDiet controlled  Renal/GU CRFRenal disease  negative genitourinary   Musculoskeletal  (+) Arthritis , Osteoarthritis,    Abdominal (+) + obese,   Peds  Hematology  (+) Blood dyscrasia, ,   Anesthesia Other Findings Past Medical History: No date: Adenoma of colon No date: Allergic rhinitis No date: Anxiety No date: Arthritis No date: Asthma No date: Back pain No date: Carpal tunnel syndrome No date: Cervicalgia No date: Chronic kidney disease     Comment: STAGE 3 No date: DDD (degenerative disc disease), lumbar No date: GERD (gastroesophageal reflux disease) No date: Headache No date: History of hiatal hernia No date: Hypertension No date: Lipids serum increased No date: Lumbago No date: Migraines No date: Obesity No date: Osteoarthritis No date: Stroke [facility]) No date: TIA (transient ischemic attack) No date: Wrist fracture Past Surgical History: 2014: BREAST BIOPSY Right      Comment: NEG 1967: BREAST EXCISIONAL BIOPSY Left     Comment: NEG No date: cataracts Bilateral 05/03/2016: COLONOSCOPY WITH PROPOFOL N/A     Comment: Procedure: COLONOSCOPY WITH PROPOFOL;                Surgeon: Lollie Sails, MD;  Location: [facility]              ENDOSCOPY;  Service: Endoscopy;  Laterality:               N/A; 10/26/2015: ESOPHAGOGASTRODUODENOSCOPY (EGD) WITH PROPOFOL N/A     Comment: Procedure: ESOPHAGOGASTRODUODENOSCOPY (EGD)               WITH PROPOFOL;  Surgeon: [name], MD;                Location: [facility] ENDOSCOPY;  Service:               Gastroenterology;  Laterality: N/A; No date: EYE SURGERY No date: OOPHORECTOMY     Comment: left   Reproductive/Obstetrics negative OB ROS                             Anesthesia Physical  Anesthesia Plan  ASA: 3  Anesthesia Plan: General   Post-op Pain Management: Minimal or no pain anticipated   Induction: Intravenous  PONV Risk Score and Plan: 3 and Propofol infusion  Airway Management Planned: Natural Airway and Nasal Cannula  Additional Equipment:   Intra-op Plan:   Post-operative Plan:   Informed Consent: I have reviewed the patients History and Physical, chart, labs and discussed the procedure including the risks, benefits and alternatives  for the proposed anesthesia with the patient or authorized representative who has indicated his/her understanding and acceptance.     Dental Advisory Given  Plan Discussed with: CRNA  Anesthesia Plan Comments:         Anesthesia Quick Evaluation

## 2022-02-17 NOTE — Transfer of Care (Signed)
Immediate Anesthesia Transfer of Care Note  Patient: Jackie Hunter  Procedure(s) Performed: EXCISION LIPOMA (Right: Arm Upper)  Patient Location: PACU  Anesthesia Type:General  Level of Consciousness: drowsy  Airway & Oxygen Therapy: Patient Spontanous Breathing  Post-op Assessment: Report given to RN and Post -op Vital signs reviewed and stable  Post vital signs: Reviewed and stable  Last Vitals:  Vitals Value Taken Time  BP 122/53 02/17/22 1734  Temp 36.3 C 02/17/22 1734  Pulse 68 02/17/22 1738  Resp 11 02/17/22 1738  SpO2 99 % 02/17/22 1738  Vitals shown include unvalidated device data.  Last Pain:  Vitals:   02/17/22 1734  PainSc: Asleep         Complications: No notable events documented.

## 2022-02-18 ENCOUNTER — Encounter: Payer: Self-pay | Admitting: Surgery

## 2022-02-21 LAB — SURGICAL PATHOLOGY

## 2022-04-28 ENCOUNTER — Ambulatory Visit (INDEPENDENT_AMBULATORY_CARE_PROVIDER_SITE_OTHER): Payer: Medicare Other | Admitting: Urology

## 2022-04-28 ENCOUNTER — Encounter: Payer: Self-pay | Admitting: Urology

## 2022-04-28 VITALS — BP 157/87 | HR 71 | Ht 63.0 in | Wt 173.0 lb

## 2022-04-28 DIAGNOSIS — Z87440 Personal history of urinary (tract) infections: Secondary | ICD-10-CM | POA: Diagnosis not present

## 2022-04-28 DIAGNOSIS — R310 Gross hematuria: Secondary | ICD-10-CM | POA: Diagnosis not present

## 2022-04-28 DIAGNOSIS — R300 Dysuria: Secondary | ICD-10-CM

## 2022-04-28 DIAGNOSIS — R3989 Other symptoms and signs involving the genitourinary system: Secondary | ICD-10-CM

## 2022-04-28 LAB — URINALYSIS, COMPLETE
Bilirubin, UA: NEGATIVE
Glucose, UA: NEGATIVE
Ketones, UA: NEGATIVE
Leukocytes,UA: NEGATIVE
Nitrite, UA: NEGATIVE
Protein,UA: NEGATIVE
Specific Gravity, UA: 1.015 (ref 1.005–1.030)
Urobilinogen, Ur: 0.2 mg/dL (ref 0.2–1.0)
pH, UA: 6 (ref 5.0–7.5)

## 2022-04-28 LAB — MICROSCOPIC EXAMINATION
Bacteria, UA: NONE SEEN
Epithelial Cells (non renal): >10 /hpf — AB (ref 0–10)

## 2022-04-28 LAB — BLADDER SCAN AMB NON-IMAGING: Scan Result: 40

## 2022-04-28 MED ORDER — FLUCONAZOLE 150 MG PO TABS: 150.0000 mg | ORAL_TABLET | Freq: Once | ORAL | 0 refills | Status: AC

## 2022-04-28 NOTE — Progress Notes (Signed)
   04/28/2022 9:30 AM   [name] 1946-02-06 [id number]  Reason for visit: Dysuria, pelvic pain, history of UTI  HPI: 76 year old female who I previously saw in November 2021 for hip and pelvic pain likely secondary to pinched nerve in her back.  She was not having any hematuria or dysuria at that time.  She has a long history of asymptomatic microscopic hematuria since at least 2015, with prior negative CT urogram and cystoscopy in 2017 that was benign.  She is a very difficult historian.  From what I understand she had some dysuria and gross hematuria and was seen at an urgent care in [place] and was prescribed antibiotics for an E. coli UTI.  She had some temporary improvement, but has had some recurrence of her symptoms with intermittent burning, pelvic pain, and intermittent gross hematuria.  Urinalysis today relatively benign with 0-5 WBC, 0-2 RBC, greater than 10 epithelial cells, yeast present, nitrite negative, no leukocytes.  She is very concerned about possible malignancy.  I personally viewed and interpreted the most recent cross-sectional imaging with CT abdomen and pelvis with contrast from November 2022 that shows benign cystic lesions in the kidneys but no hydronephrosis, stones, or obvious bladder lesions.  I recommended fluconazole 150 mg for suspected yeast infection, as well as cystoscopy to rule out any bladder lesions with her recurrent gross hematuria in the setting of relatively benign UA with no definitive evidence of infection aside from yeast.  I think it is reasonable to defer further upper tract imaging with numerous normal CTs in the last few years.  Billey Co, [facility] [address], [place], [place] [address] [phone]

## 2022-04-28 NOTE — Patient Instructions (Signed)

## 2022-04-29 ENCOUNTER — Telehealth: Payer: Self-pay | Admitting: Urology

## 2022-04-29 NOTE — Telephone Encounter (Signed)
Dr Diamantina Providence,  I was told to route this message to you.

## 2022-04-29 NOTE — Telephone Encounter (Signed)
Pt LMOM that the RX sent to [facility] is on back order.  She wanted to know if Dr Diamantina Providence may want to send her in something different.

## 2022-05-02 ENCOUNTER — Other Ambulatory Visit: Payer: Self-pay

## 2022-05-02 MED ORDER — FLUCONAZOLE 150 MG PO TABS: 150.0000 mg | ORAL_TABLET | Freq: Once | ORAL | 0 refills | Status: AC

## 2022-05-02 NOTE — Telephone Encounter (Signed)
Called and spoke with patient  would like to have this sent to [facility] in [place] on [address].

## 2022-05-02 NOTE — Telephone Encounter (Signed)
Sent rx to [facility] pt is aware of this.

## 2022-05-19 ENCOUNTER — Encounter: Payer: Self-pay | Admitting: Urology

## 2022-05-19 ENCOUNTER — Ambulatory Visit (INDEPENDENT_AMBULATORY_CARE_PROVIDER_SITE_OTHER): Payer: Medicare Other | Admitting: Urology

## 2022-05-19 VITALS — Ht 63.0 in | Wt 173.0 lb

## 2022-05-19 DIAGNOSIS — R310 Gross hematuria: Secondary | ICD-10-CM | POA: Diagnosis not present

## 2022-05-19 MED ORDER — CEPHALEXIN 250 MG PO CAPS
500.0000 mg | ORAL_CAPSULE | Freq: Once | ORAL | Status: AC
  Administered 2022-05-19: 500 mg via ORAL

## 2022-05-19 NOTE — Progress Notes (Signed)
Cystoscopy Procedure Note:  Indication:  76 year old female with prior negative microscopic hematuria work-up in 2017 and long history of stable asymptomatic microscopic hematuria seen in October for dysuria and gross hematuria.  Urinalysis was benign with no microscopic hematuria but yeast were present, and she was treated for yeast infection with significant improvement in her symptoms.  She also requested to pursue cystoscopy with her gross hematuria.  Hematuria resolved after treatment with fluconazole.  She really denies any significant symptoms today aside from some occasional frequency.  After informed consent and discussion of the procedure and its risks, Jackie Hunter was positioned and prepped in the standard fashion. Cystoscopy was performed with a flexible cystoscope. The urethra, bladder neck and entire bladder was visualized in a standard fashion. The bladder mucosa was grossly normal throughout.  The ureteral orifices were visualized in their normal location and orientation.  No abnormalities on retroflexion   Findings: Normal cystoscopy  Assessment and Plan: Behavioral strategies discussed regarding urinary symptoms, consider trial of Myrbetriq and follow-up if persistent OAB symptoms  Nickolas Madrid, MD 05/19/2022

## 2022-05-19 NOTE — Patient Instructions (Signed)

## 2022-07-13 ENCOUNTER — Other Ambulatory Visit: Payer: Medicare Other

## 2022-07-13 ENCOUNTER — Ambulatory Visit: Payer: Medicare Other | Admitting: Oncology

## 2022-08-10 ENCOUNTER — Ambulatory Visit (INDEPENDENT_AMBULATORY_CARE_PROVIDER_SITE_OTHER): Payer: 59 | Admitting: Dermatology

## 2022-08-10 VITALS — BP 147/63

## 2022-08-10 DIAGNOSIS — L811 Chloasma: Secondary | ICD-10-CM | POA: Diagnosis not present

## 2022-08-10 DIAGNOSIS — L649 Androgenic alopecia, unspecified: Secondary | ICD-10-CM

## 2022-08-10 DIAGNOSIS — L910 Hypertrophic scar: Secondary | ICD-10-CM | POA: Diagnosis not present

## 2022-08-10 MED ORDER — MINOXIDIL 2.5 MG PO TABS: 2.5000 mg | ORAL_TABLET | Freq: Every day | ORAL | 1 refills | Status: DC

## 2022-08-10 MED ORDER — FINASTERIDE 5 MG PO TABS: 5.0000 mg | ORAL_TABLET | Freq: Every day | ORAL | 1 refills | Status: DC

## 2022-08-10 NOTE — Progress Notes (Signed)
Follow-Up Visit   Subjective  Jackie Hunter is a 77 y.o. female who presents for the following: Keloids (Left breast, umbilicus. Itchy. Patient has had improvement with ILK in the past. ), Melasma (Cheeks, improved in the past with cream from [facility]. She would like a refill. ), and Alopecia (Thinning of the hair over the last year. Patient's mother has thinning hair. ).   The following portions of the chart were reviewed this encounter and updated as appropriate:       Review of Systems:  No other skin or systemic complaints except as noted in HPI or Assessment and Plan.  Objective  Well appearing patient in no apparent distress; mood and affect are within normal limits.  A focused examination was performed including face, trunk, scalp. Relevant physical exam findings are noted in the Assessment and Plan.  Left Breast, Umbilicus Firm hyperpigmented plaques  face Reticulated hyperpigmented patches        Scalp Diffuse thinning of the crown and widening of the midline part with retention of the frontal hairline - Reviewed progressive nature and prognosis.                Assessment & Plan  Keloid Left Breast, Umbilicus  Chronic and persistent condition with duration or expected duration over one year. Condition is symptomatic/ bothersome to patient. Not currently at goal.   Intralesional injection - Left Breast, Umbilicus Location: left breast, umbilicus  Informed Consent: Discussed risks (infection, pain, bleeding, bruising, thinning of the skin, loss of skin pigment, lack of resolution, and recurrence of lesion) and benefits of the procedure, as well as the alternatives. Informed consent was obtained. Preparation: The area was prepared a standard fashion.  Procedure Details: An intralesional injection was performed with Kenalog 40 mg/cc. (0.4cc breast- multiple lesions, 0.5 cc umbilicus)  0.9 cc in total were injected.  Total number of  injections: >7  Plan: The patient was instructed on post-op care. Recommend OTC analgesia as needed for pain.  Lot [id number] Exp 08/2022 NDC 0350-0938-18   Melasma face  Chronic and persistent condition with duration or expected duration over one year. Condition is symptomatic/ bothersome to patient. Not currently at goal.   Melasma is a chronic; persistent condition of hyperpigmented patches generally on the face, worse in summer due to higher UV exposure.    Heredity; thyroid disease; sun exposure; pregnancy; birth control pills; epilepsy medication and darker skin may predispose to Melasma.   Recommendations include: - Sun avoidance and daily broad spectrum (UVA/UVB) sunscreen SPF 30+, preferably with Zinc or Titanium Dioxide. - Rx topical bleaching creams (i.e. hydroquinone) is a common treatment but should not be used long term.  Hydroquinones may be mixed with retinoids; steroids; Kojic Acid. - Rx Azelaic Acid is also a treatment option that is safe for pregnancy (Category B). - OTC Heliocare can be helpful in control and prevention. - Oral Rx with Tranexamic Acid 250 mg - 650 mg po daily can be used for moderate to severe cases especially during summer (contraindications include pregnancy; lactation; hx of PE; DVT; clotting disorder; heart disease; anticoagulant use and upcoming long trips)   - Chemical peels (would need multiple for best result).  - Lasers and  Microdermabrasion may also be helpful adjunct treatments.  [facility] Hydroquinone 8%, Tretinoin 0.025%, Kojic acid 1%, Niacinamide 4%, Fluocinolone 0.025% cream, a pea sized amount nightly to dark spots on face for up to 2-3 months. 2Rf. This cannot be used more than 3  months due to risk of skin atrophy (thinning) and exogenous ochronosis (permanent dark spots).   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 Condition is symptomatic/ bothersome to patient. Not currently at goal.   Female Androgenic Alopecia is a chronic condition related to genetics and/or hormonal changes.  In women androgenetic alopecia is commonly associated with menopause but may occur any time after puberty.  It causes hair thinning primarily on the crown with widening of the part and temporal hairline recession.  Can use OTC Rogaine (minoxidil) 5% solution/foam as directed.  Oral treatments in female patients who have no contraindication may include : - Low dose oral minoxidil 1.25 - '5mg'$  daily - Spironolactone 50 - '100mg'$  bid - Finasteride 2.5 - 5 mg daily Adjunctive therapies include: - Low Level Laser Light Therapy (LLLT) - Platelet-rich plasma injections (PRP) - Hair Transplants or scalp reduction  CBC, TSH reviewed 07/20/2022 wnl Ferritin ordered today.  Start minoxidil 2.'5mg'$  take 1/2 tablet po QD dsp #30 1Rf (Rx written 1 po QD) Doses of minoxidil for hair loss are considered 'low dose'. This is because the doses used for hair loss are much lower than the doses which are used for conditions such as high blood pressure (hypertension). The doses used for hypertension are 10-'40mg'$  per day.  Side effects are uncommon at the low doses (up to 2.5 mg/day) used to treat hair loss. Potential side effects, more commonly seen at higher doses, include: Increase in hair growth (hypertrichosis) elsewhere on face and body Temporary hair shedding upon starting medication which may last up to 4 weeks Ankle swelling, fluid retention, rapid weight gain more than 5 pounds Low blood pressure and feeling lightheaded or dizzy when standing up quickly Fast or irregular heartbeat Headaches  Start finasteride '5mg'$  take 1/2 tablet po QD dsp #30 1Rf (Rx written 1 po QD)  BP 147/63  Ferritin - Scalp  minoxidil (LONITEN) 2.5 MG tablet - Scalp Take 1 tablet (2.5 mg total) by mouth daily.  finasteride  (PROSCAR) 5 MG tablet - Scalp Take 1 tablet (5 mg total) by mouth daily.   Return in about 3 months (around 11/09/2022) for alopecia, keloids, melasma.  IJamesetta Orleans, CMA, am acting as scribe for Brendolyn Patty, MD .  Documentation: I have reviewed the above documentation for accuracy and completeness, and I agree with the above.  Brendolyn Patty MD

## 2022-08-10 NOTE — Patient Instructions (Addendum)
Female Androgenic Alopecia is a chronic condition related to genetics and/or hormonal changes.  In women androgenetic alopecia is commonly associated with menopause but may occur any time after puberty.  It causes hair thinning primarily on the crown with widening of the part and temporal hairline recession.  Can use OTC Rogaine (minoxidil) 5% solution/foam as directed.  Oral treatments in female patients who have no contraindication may include : - Low dose oral minoxidil 1.25 - '5mg'$  daily - Spironolactone 50 - '100mg'$  bid - Finasteride 2.5 - 5 mg daily Adjunctive therapies include: - Low Level Laser Light Therapy (LLLT) - Platelet-rich plasma injections (PRP) - Hair Transplants or scalp reduction   Start minoxidil 2.5 MG take 1/2 tablet by mouth every day. Prescription will be written 1 pill daily. Doses of minoxidil for hair loss are considered 'low dose'. This is because the doses used for hair loss are much lower than the doses which are used for conditions such as high blood pressure (hypertension). The doses used for hypertension are 10-'40mg'$  per day.  Side effects are uncommon at the low doses (up to 2.5 mg/day) used to treat hair loss. Potential side effects, more commonly seen at higher doses, include: Increase in hair growth (hypertrichosis) elsewhere on face and body Temporary hair shedding upon starting medication which may last up to 4 weeks Ankle swelling, fluid retention, rapid weight gain more than 5 pounds Low blood pressure and feeling lightheaded or dizzy when standing up quickly Fast or irregular heartbeat Headaches  Start finasteride 5 MG take 1/2 tablet by mouth every day. Prescription will be written 1 pill daily.   Instructions for [facility] Medications One or more of your medications was sent to the [facility] mail order compounding pharmacy. You will receive an email from them and can purchase the medicine through that link. It will then be mailed to your  home at the address you confirmed. If for any reason you do not receive an email from them, please check your spam folder. If you still do not find the email, please let us know. [facility] phone number is [phone].   Due to recent changes in healthcare laws, you may see results of your pathology and/or laboratory studies on MyChart before the doctors have had a chance to review them. We understand that in some cases there may be results that are confusing or concerning to you. Please understand that not all results are received at the same time and often the doctors may need to interpret multiple results in order to provide you with the best plan of care or course of treatment. Therefore, we ask that you please give [place] 2 business days to thoroughly review all your results before contacting the office for clarification. Should we see a critical lab result, you will be contacted sooner.   If You Need Anything After Your Visit  If you have any questions or concerns for your doctor, please call our main line at [phone] and press option 4 to reach your doctor's medical assistant. If no one answers, please leave a voicemail as directed and we will return your call as soon as possible. Messages left after 4 pm will be answered the following business day.   You may also send [place] a message via [place]. We typically respond to MyChart messages within 1-2 business days.  For prescription refills, please ask your pharmacy to contact our office. Our fax number is [phone].  If you have an urgent issue when the  clinic is closed that cannot wait until the next business day, you can page your doctor at the number below.    Please note that while we do our best to be available for urgent issues outside of office hours, we are not available 24/7.   If you have an urgent issue and are unable to reach [place], you may choose to seek medical care at your doctor's office, retail clinic, urgent care center,  or emergency room.  If you have a medical emergency, please immediately call 911 or go to the emergency department.  Pager Numbers  - Dr. Nehemiah Massed: [phone]  - Dr. Laurence Ferrari: [phone]  - Dr. Nicole Kindred: [phone]  In the event of inclement weather, please call our main line at [phone] for an update on the status of any delays or closures.  Dermatology Medication Tips: Please keep the boxes that topical medications come in in order to help keep track of the instructions about where and how to use these. Pharmacies typically print the medication instructions only on the boxes and not directly on the medication tubes.   If your medication is too expensive, please contact our office at [phone] option 4 or send [place] a message through [place].   We are unable to tell what your co-pay for medications will be in advance as this is different depending on your insurance coverage. However, we may be able to find a substitute medication at lower cost or fill out paperwork to get insurance to cover a needed medication.   If a prior authorization is required to get your medication covered by your insurance company, please allow [place] 1-2 business days to complete this process.  Drug prices often vary depending on where the prescription is filled and some pharmacies may offer cheaper prices.  The website www.goodrx.com contains coupons for medications through different pharmacies. The prices here do not account for what the cost may be with help from insurance (it may be cheaper with your insurance), but the website can give you the price if you did not use any insurance.  - You can print the associated coupon and take it with your prescription to the pharmacy.  - You may also stop by our office during regular business hours and pick up a GoodRx coupon card.  - If you need your prescription sent electronically to a different pharmacy, notify our office through [facility] or by phone at  [phone] option 4.     Si Usted Necesita Algo Despus de Su Visita  Tambin puede enviarnos un mensaje a travs de Pharmacist, community. Por lo general respondemos a los mensajes de MyChart en el transcurso de 1 a 2 das hbiles.  Para renovar recetas, por favor pida a su farmacia que se ponga en contacto con nuestra oficina. Harland Dingwall de fax es [place] [phone].  Si tiene un asunto urgente cuando la clnica est cerrada y que no puede esperar hasta el siguiente da hbil, puede llamar/localizar a su doctor(a) al nmero que aparece a continuacin.   Por favor, tenga en cuenta que aunque hacemos todo lo posible para estar disponibles para asuntos urgentes fuera del horario de [place], no estamos disponibles las 24 horas del da, los 7 das de la [place].   Si tiene un problema urgente y no puede comunicarse con nosotros, puede optar por buscar atencin mdica  en el consultorio de su doctor(a), en una clnica privada, en un centro de atencin urgente o en una sala de emergencias.  Si tiene [employer]  emergencia mdica, por favor llame inmediatamente al 911 o vaya a la sala de emergencias.  Nmeros de bper  - Dr. Nehemiah Massed: [phone]  - Dra. Moye: [phone]  - Dra. Nicole Kindred: [phone]  En caso de inclemencias del [place], por favor llame a Johnsie Kindred principal al [phone] para una actualizacin sobre el [place] de cualquier retraso o cierre.  Consejos para la medicacin en dermatologa: Por favor, guarde las cajas en las que vienen los medicamentos de uso tpico para ayudarle a seguir las instrucciones sobre dnde y cmo usarlos. Las farmacias generalmente imprimen las instrucciones del medicamento slo en las cajas y no directamente en los tubos del [place].   Si su medicamento es muy caro, por favor, pngase en contacto con Zigmund Daniel llamando al [phone] y presione la opcin 4 o envenos un mensaje a travs de Pharmacist, community.   No podemos decirle cul ser su copago por los  medicamentos por adelantado ya que esto es diferente dependiendo de la cobertura de su seguro. Sin embargo, es posible que podamos encontrar un medicamento sustituto a Electrical engineer un formulario para que el seguro cubra el medicamento que se considera necesario.   Si se requiere una autorizacin previa para que su compaa de seguros [place] su medicamento, por favor permtanos de 1 a 2 das hbiles para completar este proceso.  Los precios de los medicamentos varan con frecuencia dependiendo del Environmental consultant de dnde se surte la receta y alguna farmacias pueden ofrecer precios ms baratos.  El sitio web www.goodrx.com tiene cupones para medicamentos de Airline pilot. Los precios aqu no tienen en cuenta lo que podra costar con la ayuda del seguro (puede ser ms barato con su seguro), pero el sitio web puede darle el precio si no utiliz Research scientist (physical sciences).  - Puede imprimir el cupn correspondiente y llevarlo con su receta a la farmacia.  - Tambin puede pasar por nuestra oficina durante el horario de atencin regular y Charity fundraiser una tarjeta de cupones de GoodRx.  - Si necesita que su receta se enve electrnicamente a una farmacia diferente, informe a nuestra oficina a travs de MyChart de [place] o por telfono llamando al [phone] y presione la opcin 4.

## 2022-08-12 LAB — FERRITIN: Ferritin: 37 ng/mL (ref 15–150)

## 2022-08-15 ENCOUNTER — Telehealth: Payer: Self-pay

## 2022-08-15 ENCOUNTER — Other Ambulatory Visit: Payer: Self-pay | Admitting: Internal Medicine

## 2022-08-15 DIAGNOSIS — Z1231 Encounter for screening mammogram for malignant neoplasm of breast: Secondary | ICD-10-CM

## 2022-08-15 NOTE — Telephone Encounter (Signed)
Tried to call patient with lab results. No answer and not able to leave voicemail.

## 2022-08-15 NOTE — Telephone Encounter (Signed)
-----  Message from Brendolyn Patty, MD sent at 08/15/2022  8:33 AM EST ----- Ferritin is WNL at 37 (improved from 15, seven mos ago), but for optimal hair growth, it should be at least 50.  Recommend iron supplement. Start ferrous sulfate 325 mg by mouth every other day with orange juice or vitamin C to help promote absorption.  Avoid taking with dairy (Calcium) or mineral-containing supplements which may decrease absorption.  The side effects of iron are primarily GI in type, such as cramping, constipation, and black stools.   - please call patient

## 2022-08-16 NOTE — Telephone Encounter (Signed)
Left message for patient to call for lab results. 

## 2022-08-19 ENCOUNTER — Ambulatory Visit: Payer: Medicare Other | Admitting: Physician Assistant

## 2022-08-22 ENCOUNTER — Ambulatory Visit (INDEPENDENT_AMBULATORY_CARE_PROVIDER_SITE_OTHER): Payer: 59 | Admitting: Physician Assistant

## 2022-08-22 VITALS — BP 144/85 | HR 75 | Ht 63.0 in | Wt 174.4 lb

## 2022-08-22 DIAGNOSIS — N3281 Overactive bladder: Secondary | ICD-10-CM | POA: Diagnosis not present

## 2022-08-22 LAB — URINALYSIS, COMPLETE
Bilirubin, UA: NEGATIVE
Glucose, UA: NEGATIVE
Ketones, UA: NEGATIVE
Leukocytes,UA: NEGATIVE
Nitrite, UA: NEGATIVE
Specific Gravity, UA: 1.025 (ref 1.005–1.030)
Urobilinogen, Ur: 0.2 mg/dL (ref 0.2–1.0)
pH, UA: 5.5 (ref 5.0–7.5)

## 2022-08-22 LAB — BLADDER SCAN AMB NON-IMAGING: Scan Result: 0

## 2022-08-22 LAB — MICROSCOPIC EXAMINATION: Epithelial Cells (non renal): >10 /hpf — AB (ref 0–10)

## 2022-08-22 MED ORDER — MIRABEGRON ER 50 MG PO TB24: 50.0000 mg | ORAL_TABLET | Freq: Every day | ORAL | 0 refills | Status: DC

## 2022-08-22 NOTE — Progress Notes (Signed)
08/22/2022 1:06 PM   [name] [birth year] [id number]  CC: Chief Complaint  Patient presents with   Over Active Bladder   HPI: Jackie Hunter is a 77 y.o. female with PMH microscopic hematuria with benign workup in 2017 and negative cystoscopy with Dr. Diamantina Providence in November 2023 and urinary frequency who presents today for symptom recheck.   Today she reports no dysuria or gross hematuria.  She reports chronic, bothersome urinary urgency, daytime frequency x 7, and nocturia x 3-4.  She wears pads for security, but these remain dry.  She has a history of dry mouth, constipation, and dry eye at baseline.  She drinks about 48 ounces of water daily and has 2 cups of caffeinated coffee each morning.  She will be traveling to [place] later this month and anticipates being out of town for 2 to 3 months.  In-office UA today positive for 1+ bilirubin, 2+ blood, and 1+ protein; urine microscopy with 3-10 RBCs/HPF, >10 epithelial cells/hpf, hyaline and granular casts, many bacteria, and yeast.  PVR 75m.  PMH: Past Medical History:  Diagnosis Date   Adenoma of colon    Allergic rhinitis    Anemia    Anxiety    Arthritis    Asthma    Back pain    Carpal tunnel syndrome    Carpal tunnel syndrome    Carpal tunnel syndrome    Cataract cortical, senile    Cervicalgia    Chronic kidney disease    STAGE 3   CKD (chronic kidney disease)    DDD (degenerative disc disease), lumbar    DDD (degenerative disc disease), lumbar    Diabetes mellitus without complication (HCC)    does not take any medications   Diverticulosis    Gastritis    GERD (gastroesophageal reflux disease)    Headache    Hiatal hernia    History of hiatal hernia    Hypertension    Lipids serum increased    Lumbago    Migraines    Obesity    Osteoarthritis    Stroke ([facility]    TIA (transient ischemic attack)    Wrist fracture     Surgical History: Past Surgical History:  Procedure Laterality Date    BREAST BIOPSY Right 2014   NEG   BREAST BIOPSY Left 11/24/2016   [place], radial scar    BREAST EXCISIONAL BIOPSY Left 1967   NEG   cataracts Bilateral    COLONOSCOPY WITH PROPOFOL N/A 05/03/2016   Procedure: COLONOSCOPY WITH PROPOFOL;  Surgeon: MLollie Sails MD;  [facility]  Service: Endoscopy;  Laterality: N/A;   COLONOSCOPY WITH PROPOFOL N/A 11/01/2016   Procedure: COLONOSCOPY WITH PROPOFOL;  Surgeon: MLollie Sails MD;  [facility]  Service: Endoscopy;  Laterality: N/A;   COLONOSCOPY WITH PROPOFOL N/A 11/02/2016   Procedure: COLONOSCOPY WITH PROPOFOL;  Surgeon: MLollie Sails MD;  [facility];  Service: Endoscopy;  Laterality: N/A;   COLONOSCOPY WITH PROPOFOL N/A 12/01/2017   Procedure: COLONOSCOPY WITH PROPOFOL;  Surgeon: SLollie Sails MD;  [facility];  Service: Endoscopy;  Laterality: N/A;   COLONOSCOPY WITH PROPOFOL N/A 03/02/2018   Procedure: COLONOSCOPY WITH PROPOFOL;  Surgeon: SLollie Sails MD;  [facility];  Service: Endoscopy;  Laterality: N/A;   COLONOSCOPY WITH PROPOFOL N/A 12/28/2020   Procedure: COLONOSCOPY WITH PROPOFOL;  Surgeon: VLin Landsman MD;  [facility];  Service: Gastroenterology;  Laterality: N/A;   ESOPHAGOGASTRODUODENOSCOPY (EGD)  WITH PROPOFOL N/A 10/26/2015   Procedure: ESOPHAGOGASTRODUODENOSCOPY (EGD) WITH PROPOFOL;  Surgeon: [name], MD;  [facility];  Service: Gastroenterology;  Laterality: N/A;   ESOPHAGOGASTRODUODENOSCOPY (EGD) WITH PROPOFOL N/A 02/12/2019   Procedure: ESOPHAGOGASTRODUODENOSCOPY (EGD) WITH PROPOFOL;  Surgeon: Lollie Sails, MD;  [facility];  Service: Endoscopy;  Laterality: N/A;   EYE SURGERY     cataract surgery   LIPOMA EXCISION Right 02/17/2022   Procedure: EXCISION LIPOMA;  Surgeon: Benjamine Sprague, DO;  Location: [facility] ORS;  Service: General;  Laterality: Right;   OOPHORECTOMY     left    OOPHORECTOMY Left     Home Medications:  Allergies as of 08/22/2022       Reactions   Levaquin [levofloxacin In D5w] Other (See Comments)   dizziness   Levofloxacin    Other reaction(s): Dizziness dizziness   Meloxicam    Other reaction(s): Other (See Comments) GI upset   Codeine Anxiety, Other (See Comments)   Other reaction(s): Unknown (See Comments) Increased heart rate Difficulty breathing; arrythmia   Sucralfate Nausea Only        Medication List        Accurate as of August 22, 2022  1:06 PM. If you have any questions, ask your nurse or doctor.          acetaminophen 325 MG tablet Commonly known as: TYLENOL Take 2 tablets (650 mg total) by mouth every 6 (six) hours as needed for mild pain, fever or headache.   ALPRAZolam 0.25 MG tablet Commonly known as: XANAX Take 0.25 mg by mouth at bedtime as needed for anxiety.   ARIPiprazole 2 MG tablet Commonly known as: ABILIFY Take 2 mg by mouth daily.   ascorbic acid 500 MG tablet Commonly known as: VITAMIN C Take 500 mg by mouth daily.   aspirin EC 81 MG tablet Take 81 mg by mouth as needed. Swallow whole.   BLACK ELDERBERRY PO Take by mouth as needed.   buPROPion 300 MG 24 hr tablet Commonly known as: WELLBUTRIN XL Take 300 mg by mouth daily.   CRANBERRY PLUS VITAMIN C PO Take 1 tablet by mouth as needed.   cyanocobalamin 1000 MCG tablet Commonly known as: VITAMIN B12 Take 1,000 mcg by mouth daily.   fexofenadine 60 MG tablet Commonly known as: ALLEGRA Take 60 mg by mouth as needed.   finasteride 5 MG tablet Commonly known as: PROSCAR Take 1 tablet (5 mg total) by mouth daily.   lisinopril-hydrochlorothiazide 10-12.5 MG tablet Commonly known as: ZESTORETIC Take 1 tablet by mouth daily.   Lithium Orotate 5 MG Caps Take by mouth as needed.   minoxidil 2.5 MG tablet Commonly known as: LONITEN Take 1 tablet (2.5 mg total) by mouth daily.   mirabegron ER 50 MG Tb24 tablet Commonly known  as: MYRBETRIQ Take 1 tablet (50 mg total) by mouth daily. Started by: Debroah Loop, PA-C   Omega-3 1000 MG Caps Take 1 capsule by mouth as needed.   pantoprazole 40 MG tablet Commonly known as: Protonix Take 1 tablet (40 mg total) by mouth daily.   traZODone 50 MG tablet Commonly known as: DESYREL Take 1 tablet by mouth at bedtime.        Allergies:  Allergies  Allergen Reactions   Levaquin [Levofloxacin In D5w] Other (See Comments)    dizziness   Levofloxacin     Other reaction(s): Dizziness dizziness   Meloxicam     Other reaction(s): Other (See Comments) GI upset  Codeine Anxiety and Other (See Comments)    Other reaction(s): Unknown (See Comments) Increased heart rate  Difficulty breathing; arrythmia   Sucralfate Nausea Only    Family History: Family History  Problem Relation Age of Onset   Leukemia Mother    Arthritis Mother    Leukemia Nephew    Kidney cancer Neg Hx    Prostate cancer Neg Hx    Breast cancer Neg Hx     Social History:   reports that she has never smoked. She has never been exposed to tobacco smoke. She has never used smokeless tobacco. She reports current alcohol use. She reports that she does not use drugs.  Physical Exam: BP (!) 144/85   Pulse 75   Ht '5\' 3"'$  (1.6 m)   Wt 174 lb 7 oz (79.1 kg)   BMI 30.90 kg/m   Constitutional:  Alert and oriented, no acute distress, nontoxic appearing HEENT: [place], AT Cardiovascular: No clubbing, cyanosis, or edema Respiratory: Normal respiratory effort, no increased work of breathing Skin: No rashes, bruises or suspicious lesions Neurologic: Grossly intact, no focal deficits, moving all 4 extremities Psychiatric: Normal mood and affect  Laboratory Data: Results for orders placed or performed in visit on 08/22/22  Microscopic Examination   Urine  Result Value Ref Range   WBC, UA 0-5 0 - 5 /hpf   RBC, Urine 3-10 (A) 0 - 2 /hpf   Epithelial Cells (non renal) >10 (A) 0 - 10 /hpf    Casts Present (A) None seen /lpf   Cast Type Hyaline casts N/A   Bacteria, UA Many (A) None seen/Few   Yeast, UA Present (A) None seen  Urinalysis, Complete  Result Value Ref Range   Specific Gravity, UA 1.025 1.005 - 1.030   pH, UA 5.5 5.0 - 7.5   Color, UA Yellow Yellow   Appearance Ur Clear Clear   Leukocytes,UA Negative Negative   Protein,UA 1+ (A) Negative/Trace   Glucose, UA Negative Negative   Ketones, UA Negative Negative   RBC, UA 2+ (A) Negative   Bilirubin, UA Negative Negative   Urobilinogen, Ur 0.2 0.2 - 1.0 mg/dL   Nitrite, UA Negative Negative   Microscopic Examination See below:   Bladder Scan (Post Void Residual) in office  Result Value Ref Range   Scan Result 0 ml    Assessment & Plan:   1. OAB (overactive bladder) OAB dry, most bothersome symptom is urgency.  She is emptying appropriately.  Her UA today has stable microscopic hematuria.  She does not appear clinically infected, and I think her bacteriuria and candiduria are secondary to sample contamination.  Will start Myrbetriq and prescribe her a 12-monthsupply upon completion of samples if desired with plans for follow-up with [facility] after she returns to town.  She is in agreement with this plan. - Bladder Scan (Post Void Residual) in office - Urinalysis, Complete - mirabegron ER (MYRBETRIQ) 50 MG TB24 tablet; Take 1 tablet (50 mg total) by mouth daily.  Dispense: 28 tablet; Refill: 0  Return in about 4 months (around 12/21/2022) for Symptom recheck with [facility].  SDebroah Loop PA-C  [facility] [address] [phone]

## 2022-08-23 ENCOUNTER — Telehealth: Payer: Self-pay

## 2022-08-23 NOTE — Telephone Encounter (Signed)
-----   Message from Brendolyn Patty, MD sent at 08/15/2022  8:33 AM EST ----- Ferritin is WNL at 37 (improved from 15, seven mos ago), but for optimal hair growth, it should be at least 50.  Recommend iron supplement. Start ferrous sulfate 325 mg by mouth every other day with orange juice or vitamin C to help promote absorption.  Avoid taking with dairy (Calcium) or mineral-containing supplements which may decrease absorption.  The side effects of iron are primarily GI in type, such as cramping, constipation, and black stools.   - please call patient

## 2022-08-23 NOTE — Telephone Encounter (Signed)
Patient advised of lab results and supplements per Dr. Nicole Kindred. aw

## 2022-11-22 ENCOUNTER — Ambulatory Visit: Payer: 59 | Admitting: Dermatology

## 2022-12-08 ENCOUNTER — Ambulatory Visit: Payer: 59 | Admitting: Physician Assistant

## 2022-12-09 ENCOUNTER — Encounter: Payer: Self-pay | Admitting: Physician Assistant

## 2022-12-13 ENCOUNTER — Ambulatory Visit (INDEPENDENT_AMBULATORY_CARE_PROVIDER_SITE_OTHER): Payer: 59 | Admitting: Physician Assistant

## 2022-12-13 VITALS — BP 149/81 | HR 91 | Ht 63.0 in | Wt 157.0 lb

## 2022-12-13 DIAGNOSIS — R310 Gross hematuria: Secondary | ICD-10-CM | POA: Diagnosis not present

## 2022-12-13 DIAGNOSIS — N3281 Overactive bladder: Secondary | ICD-10-CM | POA: Diagnosis not present

## 2022-12-13 LAB — BLADDER SCAN AMB NON-IMAGING: Scan Result: 0

## 2022-12-13 NOTE — Progress Notes (Signed)
12/13/2022 11:22 AM   Jackie Hunter 1946-03-26 [id number]  CC: Chief Complaint  Patient presents with   Over Active Bladder   HPI: Jackie Hunter is a 77 y.o. female with PMH microscopic hematuria area with benign workup in 2017 and negative cystoscopy with Dr. Richardo Hanks in November 2023 and OAB dry who presents today for symptom recheck on Myrbetriq 50 mg after returning from a prolonged trip to [place].   Today she reports she developed painless gross hematuria while traveling 3 months ago the lasted about 3 days and resolved on its own.  She did not take any medication to treat this.  She stopped Myrbetriq, because she was concerned that this might be contributing.  He denies dysuria.  PVR 0 mL.  PMH: Past Medical History:  Diagnosis Date   Adenoma of colon    Allergic rhinitis    Anemia    Anxiety    Arthritis    Asthma    Back pain    Carpal tunnel syndrome    Carpal tunnel syndrome    Carpal tunnel syndrome    Cataract cortical, senile    Cervicalgia    Chronic kidney disease    STAGE 3   CKD (chronic kidney disease)    DDD (degenerative disc disease), lumbar    DDD (degenerative disc disease), lumbar    Diabetes mellitus without complication (HCC)    does not take any medications   Diverticulosis    Gastritis    GERD (gastroesophageal reflux disease)    Headache    Hiatal hernia    History of hiatal hernia    Hypertension    Lipids serum increased    Lumbago    Migraines    Obesity    Osteoarthritis    Stroke [facility])    TIA (transient ischemic attack)    Wrist fracture     Surgical History: Past Surgical History:  Procedure Laterality Date   BREAST BIOPSY Right 2014   NEG   BREAST BIOPSY Left 11/24/2016   [place], radial scar    BREAST EXCISIONAL BIOPSY Left 1967   NEG   cataracts Bilateral    COLONOSCOPY WITH PROPOFOL N/A 05/03/2016   Procedure: COLONOSCOPY WITH PROPOFOL;  Surgeon: Christena Deem, MD;  Location: [facility] ENDOSCOPY;   Service: Endoscopy;  Laterality: N/A;   COLONOSCOPY WITH PROPOFOL N/A 11/01/2016   Procedure: COLONOSCOPY WITH PROPOFOL;  Surgeon: Christena Deem, MD;  Location: [facility] ENDOSCOPY;  Service: Endoscopy;  Laterality: N/A;   COLONOSCOPY WITH PROPOFOL N/A 11/02/2016   Procedure: COLONOSCOPY WITH PROPOFOL;  Surgeon: Christena Deem, MD;  Location[facility] ENDOSCOPY;  Service: Endoscopy;  Laterality: N/A;   COLONOSCOPY WITH PROPOFOL N/A 12/01/2017   Procedure: COLONOSCOPY WITH PROPOFOL;  Surgeon: Christena Deem, MD;  Location: [facility] ENDOSCOPY;  Service: Endoscopy;  Laterality: N/A;   COLONOSCOPY WITH PROPOFOL N/A 03/02/2018   Procedure: COLONOSCOPY WITH PROPOFOL;  Surgeon: Christena Deem, MD;  Location: [facility] ENDOSCOPY;  Service: Endoscopy;  Laterality: N/A;   COLONOSCOPY WITH PROPOFOL N/A 12/28/2020   Procedure: COLONOSCOPY WITH PROPOFOL;  Surgeon: Toney Reil, MD;  Location: [facility] ENDOSCOPY;  Service: Gastroenterology;  Laterality: N/A;   ESOPHAGOGASTRODUODENOSCOPY (EGD) WITH PROPOFOL N/A 10/26/2015   Procedure: ESOPHAGOGASTRODUODENOSCOPY (EGD) WITH PROPOFOL;  Surgeon: Wallace Cullens, MD;  Location: [facility] ENDOSCOPY;  Service: Gastroenterology;  Laterality: N/A;   ESOPHAGOGASTRODUODENOSCOPY (EGD) WITH PROPOFOL N/A 02/12/2019   Procedure: ESOPHAGOGASTRODUODENOSCOPY (EGD) WITH PROPOFOL;  Surgeon: Christena Deem, MD;  Location: [facility]  ENDOSCOPY;  Service: Endoscopy;  Laterality: N/A;   EYE SURGERY     cataract surgery   LIPOMA EXCISION Right 02/17/2022   Procedure: EXCISION LIPOMA;  Surgeon: Sung Amabile, DO;  Location: [facility] ORS;  Service: General;  Laterality: Right;   OOPHORECTOMY     left   OOPHORECTOMY Left     Home Medications:  Allergies as of 12/13/2022       Reactions   Levaquin [levofloxacin In D5w] Other (See Comments)   dizziness   Levofloxacin    Other reaction(s): Dizziness dizziness   Meloxicam    Other reaction(s): Other (See Comments) GI upset   Codeine Anxiety, Other  (See Comments)   Other reaction(s): Unknown (See Comments) Increased heart rate Difficulty breathing; arrythmia   Sucralfate Nausea Only        Medication List        Accurate as of Dec 13, 2022 11:22 AM. If you have any questions, ask your nurse or doctor.          acetaminophen 325 MG tablet Commonly known as: TYLENOL Take 2 tablets (650 mg total) by mouth every 6 (six) hours as needed for mild pain, fever or headache.   ALPRAZolam 0.25 MG tablet Commonly known as: XANAX Take 0.25 mg by mouth at bedtime as needed for anxiety.   ARIPiprazole 2 MG tablet Commonly known as: ABILIFY Take 2 mg by mouth daily.   ascorbic acid 500 MG tablet Commonly known as: VITAMIN C Take 500 mg by mouth daily.   aspirin EC 81 MG tablet Take 81 mg by mouth as needed. Swallow whole.   BLACK ELDERBERRY PO Take by mouth as needed.   buPROPion 300 MG 24 hr tablet Commonly known as: WELLBUTRIN XL Take 300 mg by mouth daily.   CRANBERRY PLUS VITAMIN C PO Take 1 tablet by mouth as needed.   cyanocobalamin 1000 MCG tablet Commonly known as: VITAMIN B12 Take 1,000 mcg by mouth daily.   fexofenadine 60 MG tablet Commonly known as: ALLEGRA Take 60 mg by mouth as needed.   finasteride 5 MG tablet Commonly known as: PROSCAR Take 1 tablet (5 mg total) by mouth daily.   lisinopril-hydrochlorothiazide 10-12.5 MG tablet Commonly known as: ZESTORETIC Take 1 tablet by mouth daily.   Lithium Orotate 5 MG Caps Take by mouth as needed.   minoxidil 2.5 MG tablet Commonly known as: LONITEN Take 1 tablet (2.5 mg total) by mouth daily.   mirabegron ER 50 MG Tb24 tablet Commonly known as: MYRBETRIQ Take 1 tablet (50 mg total) by mouth daily.   Omega-3 1000 MG Caps Take 1 capsule by mouth as needed.   pantoprazole 40 MG tablet Commonly known as: Protonix Take 1 tablet (40 mg total) by mouth daily.   traZODone 50 MG tablet Commonly known as: DESYREL Take 1 tablet by mouth at  bedtime.        Allergies:  Allergies  Allergen Reactions   Levaquin [Levofloxacin In D5w] Other (See Comments)    dizziness   Levofloxacin     Other reaction(s): Dizziness dizziness   Meloxicam     Other reaction(s): Other (See Comments) GI upset   Codeine Anxiety and Other (See Comments)    Other reaction(s): Unknown (See Comments) Increased heart rate  Difficulty breathing; arrythmia   Sucralfate Nausea Only    Family History: Family History  Problem Relation Age of Onset   Leukemia Mother    Arthritis Mother    Leukemia Nephew    Kidney  cancer Neg Hx    Prostate cancer Neg Hx    Breast cancer Neg Hx     Social History:   reports that she has never smoked. She has never been exposed to tobacco smoke. She has never used smokeless tobacco. She reports current alcohol use. She reports that she does not use drugs.  Physical Exam: BP (!) 149/81   Pulse 91   Ht 5\' 3"  (1.6 m)   Wt 157 lb (71.2 kg)   BMI 27.81 kg/m   Constitutional:  Alert and oriented, no acute distress, nontoxic appearing HEENT: [place], AT Cardiovascular: No clubbing, cyanosis, or edema Respiratory: Normal respiratory effort, no increased work of breathing Skin: No rashes, bruises or suspicious lesions Neurologic: Grossly intact, no focal deficits, moving all 4 extremities Psychiatric: Normal mood and affect  Laboratory Data: Results for orders placed or performed in visit on 12/13/22  Bladder Scan (Post Void Residual) in office  Result Value Ref Range   Scan Result 0    Assessment & Plan:   1. OAB (overactive bladder) She stopped Myrbetriq due to #2 below.  She prefers to defer reinitiation of pharmacotherapy pending CT results as below.  Will continue to monitor. - Bladder Scan (Post Void Residual) in office  2. Gross hematuria 3 days of painless gross hematuria 3 months ago while traveling.  She denies dysuria and is not clinically infected today.  I recommended pursuing CT urogram for  further evaluation and she agreed.  Will consider repeat cystoscopy based on results. - CT HEMATURIA WORKUP; Future  Return for Will call with results.  Carman Ching, PA-C  [facility] [place] [address], [address] [place], [place] [address] [phone]

## 2022-12-23 ENCOUNTER — Ambulatory Visit
Admission: RE | Admit: 2022-12-23 | Discharge: 2022-12-23 | Disposition: A | Discharge status: Home / Self Care | Payer: 59 | Source: Ambulatory Visit | Attending: Physician Assistant | Admitting: Physician Assistant

## 2022-12-23 DIAGNOSIS — R310 Gross hematuria: Secondary | ICD-10-CM | POA: Insufficient documentation

## 2022-12-23 MED ORDER — IOHEXOL 300 MG/ML  SOLN
100.0000 mL | Freq: Once | INTRAMUSCULAR | Status: AC | PRN
  Administered 2022-12-23: 100 mL via INTRAVENOUS

## 2023-01-02 ENCOUNTER — Telehealth: Payer: Self-pay | Admitting: Physician Assistant

## 2023-01-02 LAB — POCT I-STAT CREATININE: Creatinine, Ser: 1.1 mg/dL — ABNORMAL HIGH (ref 0.44–1.00)

## 2023-01-02 NOTE — Telephone Encounter (Signed)
Pt returned call

## 2023-01-04 ENCOUNTER — Ambulatory Visit (INDEPENDENT_AMBULATORY_CARE_PROVIDER_SITE_OTHER): Payer: 59 | Admitting: Dermatology

## 2023-01-04 VITALS — BP 157/85 | HR 72

## 2023-01-04 DIAGNOSIS — L910 Hypertrophic scar: Secondary | ICD-10-CM | POA: Diagnosis not present

## 2023-01-04 DIAGNOSIS — L649 Androgenic alopecia, unspecified: Secondary | ICD-10-CM | POA: Diagnosis not present

## 2023-01-04 DIAGNOSIS — L811 Chloasma: Secondary | ICD-10-CM | POA: Diagnosis not present

## 2023-01-04 MED ORDER — MINOXIDIL 2.5 MG PO TABS
2.5000 mg | ORAL_TABLET | Freq: Every day | ORAL | 1 refills | Status: AC
Start: 2023-01-04 — End: ?

## 2023-01-04 MED ORDER — TRIAMCINOLONE ACETONIDE 10 MG/ML IJ SUSP
20.0000 mg | Freq: Once | INTRAMUSCULAR | Status: AC
  Administered 2023-01-04: 20 mg via INTRADERMAL

## 2023-01-04 MED ORDER — AZELAIC ACID 15 % EX GEL: CUTANEOUS | 2 refills | Status: DC

## 2023-01-04 MED ORDER — TRIAMCINOLONE ACETONIDE 40 MG/ML IJ SUSP
40.0000 mg | Freq: Once | INTRAMUSCULAR | Status: AC
Start: 2023-01-04 — End: 2023-01-04
  Administered 2023-01-04: 40 mg via INTRADERMAL

## 2023-01-04 MED ORDER — FINASTERIDE 5 MG PO TABS: 5.0000 mg | ORAL_TABLET | Freq: Every day | ORAL | 1 refills | Status: DC

## 2023-01-04 NOTE — Patient Instructions (Addendum)
Face:  Start Azelaic acid gel to face in morning. Wash off at bedtime.  Can use twice daily on months not using compounded topical.   Start prescription Skin Medicinals Hydroquinone 8%, Tretinoin 0.1%, Kojic acid 1%, Niacinamide 4%, Fluocinolone 0.025% cream, a pea sized amount nightly to dark spots on face for up to 3 months. This cannot be used more than 3 months due to risk of skin atrophy (thinning) and exogenous ochronosis (permanent dark spots). The patient was advised this is not covered by insurance. They will receive an email to check out and the medication will be mailed to their home.    Hair Loss:  Re-Start minoxidil 2.5mg  take 1 tablet by mouth once daily.   Re-Start finasteride 5mg  take 1 tablet by mouth once daily.     Doses of minoxidil for hair loss are considered 'low dose'. This is because the doses used for hair loss are much lower than the doses which are used for conditions such as high blood pressure (hypertension). The doses used for hypertension are 10-40mg  per day.  Side effects are uncommon at the low doses (up to 2.5 mg/day) used to treat hair loss. Potential side effects, more commonly seen at higher doses, include: Increase in hair growth (hypertrichosis) elsewhere on face and body Temporary hair shedding upon starting medication which may last up to 4 weeks Ankle swelling, fluid retention, rapid weight gain more than 5 pounds Low blood pressure and feeling lightheaded or dizzy when standing up quickly Fast or irregular heartbeat Headaches    Intralesional steroid injection side effects were reviewed including thinning of the skin and discoloration, such as redness, lightening or darkening.    Due to recent changes in healthcare laws, you may see results of your pathology and/or laboratory studies on MyChart before the doctors have had a chance to review them. We understand that in some cases there may be results that are confusing or concerning to you.  Please understand that not all results are received at the same time and often the doctors may need to interpret multiple results in order to provide you with the best plan of care or course of treatment. Therefore, we ask that you please give [place] 2 business days to thoroughly review all your results before contacting the office for clarification. Should we see a critical lab result, you will be contacted sooner.   If You Need Anything After Your Visit  If you have any questions or concerns for your doctor, please call our main line at [phone] and press option 4 to reach your doctor's medical assistant. If no one answers, please leave a voicemail as directed and we will return your call as soon as possible. Messages left after 4 pm will be answered the following business day.   You may also send [place] a message via MyChart. We typically respond to MyChart messages within 1-2 business days.  For prescription refills, please ask your pharmacy to contact our office. Our fax number is [phone].  If you have an urgent issue when the clinic is closed that cannot wait until the next business day, you can page your doctor at the number below.    Please note that while we do our best to be available for urgent issues outside of office hours, we are not available 24/7.   If you have an urgent issue and are unable to reach [place], you may choose to seek medical care at your doctor's office, retail clinic, urgent care center, or  emergency room.  If you have a medical emergency, please immediately call 911 or go to the emergency department.  Pager Numbers  - Dr. Gwen Pounds: [phone]  - Dr. Neale Burly: [phone]  - Dr. Roseanne Reno: [phone]  In the event of inclement weather, please call our main line at [phone] for an update on the status of any delays or closures.  Dermatology Medication Tips: Please keep the boxes that topical medications come in in order to help keep track of the  instructions about where and how to use these. Pharmacies typically print the medication instructions only on the boxes and not directly on the medication tubes.   If your medication is too expensive, please contact our office at [phone] option 4 or send [place] a message through MyChart.   We are unable to tell what your co-pay for medications will be in advance as this is different depending on your insurance coverage. However, we may be able to find a substitute medication at lower cost or fill out paperwork to get insurance to cover a needed medication.   If a prior authorization is required to get your medication covered by your insurance company, please allow [place] 1-2 business days to complete this process.  Drug prices often vary depending on where the prescription is filled and some pharmacies may offer cheaper prices.  The website www.goodrx.com contains coupons for medications through different pharmacies. The prices here do not account for what the cost may be with help from insurance (it may be cheaper with your insurance), but the website can give you the price if you did not use any insurance.  - You can print the associated coupon and take it with your prescription to the pharmacy.  - You may also stop by our office during regular business hours and pick up a GoodRx coupon card.  - If you need your prescription sent electronically to a different pharmacy, notify our office through [facility] or by phone at [phone] option 4.     Si Usted Necesita Algo Despus de Su Visita  Tambin puede enviarnos un mensaje a travs de Clinical cytogeneticist. Por lo general respondemos a los mensajes de MyChart en el transcurso de 1 a 2 das hbiles.  Para renovar recetas, por favor pida a su farmacia que se ponga en contacto con nuestra oficina. Annie Sable de fax es [place] [phone].  Si tiene un asunto urgente cuando la clnica est cerrada y que no puede esperar hasta el siguiente da hbil,  puede llamar/localizar a su doctor(a) al nmero que aparece a continuacin.   Por favor, tenga en cuenta que aunque hacemos todo lo posible para estar disponibles para asuntos urgentes fuera del horario de [place], no estamos disponibles las 24 horas del da, los 7 [address]  [address] de la [place].   Si tiene un problema urgente y no puede comunicarse con nosotros, puede optar por buscar atencin mdica  en el consultorio de su doctor(a), en una clnica privada, en un centro de atencin urgente o en una sala de emergencias.  Si tiene Engineer, drilling, por favor llame inmediatamente al 911 o vaya a la sala de emergencias.  Nmeros de bper  - Dr. Gwen Pounds: [phone]  - Dra. Moye: [phone]  - Dra. Roseanne Reno: [phone]  En caso de inclemencias del [place], por favor llame a Lacy Duverney principal al [phone] para una actualizacin sobre el [place] de cualquier retraso o cierre.  Consejos para la medicacin en dermatologa: Por favor, guarde las cajas en las que vienen  los medicamentos de uso tpico para ayudarle a seguir las instrucciones sobre dnde y cmo usarlos. Las farmacias generalmente imprimen las instrucciones del medicamento slo en las cajas y no directamente en los tubos del [place].   Si su medicamento es muy caro, por favor, pngase en contacto con Rolm Gala llamando al [phone] y presione la opcin 4 o envenos un mensaje a travs de Clinical cytogeneticist.   No podemos decirle cul ser su copago por los medicamentos por adelantado ya que esto es diferente dependiendo de la cobertura de su seguro. Sin embargo, es posible que podamos encontrar un medicamento sustituto a Audiological scientist un formulario para que el seguro cubra el medicamento que se considera necesario.   Si se requiere una autorizacin previa para que su compaa de seguros [place] su medicamento, por favor permtanos de 1 a 2 das hbiles para completar [address]  Los precios de los medicamentos varan con  frecuencia dependiendo del Environmental consultant de dnde se surte la receta y alguna farmacias pueden ofrecer precios ms baratos.  El sitio web www.goodrx.com tiene cupones para medicamentos de Health and safety inspector. Los precios aqu no tienen en cuenta lo que podra costar con la ayuda del seguro (puede ser ms barato con su seguro), pero el sitio web puede darle el precio si no utiliz Tourist information centre manager.  - Puede imprimir el cupn correspondiente y llevarlo con su receta a la farmacia.  - Tambin puede pasar por nuestra oficina durante el horario de atencin regular y Education officer, museum una tarjeta de cupones de GoodRx.  - Si necesita que su receta se enve electrnicamente a una farmacia diferente, informe a nuestra oficina a travs de MyChart de [place] o por telfono llamando al [phone] y presione la opcin 4.

## 2023-01-04 NOTE — Progress Notes (Signed)
Follow-Up Visit   Subjective  Jackie Hunter is a 77 y.o. female who presents for the following: 5 month follow up. Hair loss, keloid, melasma.Getting steroid shots into scars on breast.  Has new scar on R upper arm from recent surgery that is very tender to touch. Alopecia: is not currently taking Minoxidil or Finasteride. Did not keep 3 month follow up appointment and ran out of meds. She felt like it was helping while she was taking it.  Melasma: using SM compound cream 3 months on 3 months off as directed.  Wants to try something stronger.   The following portions of the chart were reviewed this encounter and updated as appropriate: medications, allergies, medical history  Review of Systems:  No other skin or systemic complaints except as noted in HPI or Assessment and Plan.  Objective  Well appearing patient in no apparent distress; mood and affect are within normal limits.  A focused examination was performed of the following areas: Scalp, face, chest  Relevant exam findings are noted in the Assessment and Plan.    Assessment & Plan    KELOID Exam shows Firm pink/brown dermal papule(s)/plaque(s) at left breast with thinning and right posterior upper arm.  Treatment Plan: ILK injections  Location: left breast and right posterior upper arm.  Informed Consent: Discussed risks (infection, pain, bleeding, bruising, thinning of the skin, loss of skin pigment, lack of resolution, and recurrence of lesion) and benefits of the procedure, as well as the alternatives. Informed consent was obtained. Preparation: The area was prepared a standard fashion.  Anesthesia: none  Procedure Details: An intralesional injection was performed with Kenalog 20 mg/cc, Kenalog 40 mg/cc. 0.2 cc in total were injected to left breast.  Kenalog 40mg /cc 0.1 cc injected into right posterior upper arm.  Total number of injections: 8 L breast, 6 R arm  Plan: The patient was instructed on post-op  care. Recommend OTC analgesia as needed for pain.  NDC: 0003-0293-28 Lot: [id number] Exp:07/2023   MELASMA Exam: reticulated hyperpigmented patches at face, some mild fading when compared to baseline  Chronic and persistent condition with duration or expected duration over one year. Condition is symptomatic/ bothersome to patient. Not currently at goal.   Melasma is a chronic; persistent condition of hyperpigmented patches generally on the face, worse in summer due to higher UV exposure.    Heredity; thyroid disease; sun exposure; pregnancy; birth control pills; epilepsy medication and darker skin may predispose to Melasma.   Recommendations include: - Sun avoidance and daily broad spectrum (UVA/UVB) tinted mineral sunscreen SPF 30+, with Zinc or Titanium Dioxide. - Rx topical bleaching creams (i.e. hydroquinone) is a common treatment but should not be used long term.  Hydroquinones may be mixed with retinoids; vitamin C; steroids; Kojic Acid. - Alastin A-luminate, retinoids, vitamin C, topical tranexamic acid, glycolic acid and kojic acid can be used for brightening while on break from hydroquinone - Rx Azelaic Acid is also a treatment option that is safe for pregnancy (Category B). - OTC Heliocare can be helpful in control and prevention. - Oral Rx with Tranexamic Acid 250 mg - 650 mg po daily can be used for moderate to severe cases especially during summer (contraindications include pregnancy; lactation; hx of PE; hx of DVT; clotting disorder; heart disease; anticoagulant use and upcoming long trips)   - Chemical peels (would need multiple for best result).  - Lasers and  Microdermabrasion may also be helpful adjunct treatments.  Treatment Plan: Start Azelaic acid  gel to face in morning. Wash off at bedtime.  Will increase tretinoin strength in SM mix Start prescription Skin Medicinals Hydroquinone 8%, Tretinoin 0.1%, Kojic acid 1%, Niacinamide 4%, Fluocinolone 0.025% cream, a pea sized  amount nightly to dark spots on face for up to 3 months. This cannot be used more than 3 months due to risk of skin atrophy (thinning) and exogenous ochronosis (permanent dark spots). The patient was advised this is not covered by insurance. They will receive an email to check out and the medication will be mailed to their home.    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 Condition is symptomatic/ bothersome to patient. Not currently at goal.   Female Androgenic Alopecia is a chronic condition related to genetics and/or hormonal changes.  In women androgenetic alopecia is commonly associated with menopause but may occur any time after puberty.  It causes hair thinning primarily on the crown with widening of the part and temporal hairline recession.  Can use OTC Rogaine (minoxidil) 5% solution/foam as directed.  Oral treatments in female patients who have no contraindication may include : - Low dose oral minoxidil 1.25 - 5mg  daily - Spironolactone 50 - 100mg  bid - Finasteride 2.5 - 5 mg daily Adjunctive therapies include: - Low Level Laser Light Therapy (LLLT) - Platelet-rich plasma injections (PRP) - Hair Transplants or scalp reduction   Treatment Plan: Restart minoxidil 2.5mg  take 1 tablet po QD dsp #90 1Rf  Doses of minoxidil for hair loss are considered 'low dose'. This is because the doses used for hair loss are much lower than the doses which are used for conditions such as high blood pressure (hypertension). The doses used for hypertension are 10-40mg  per day.  Side effects are uncommon at the low doses (up to 2.5 mg/day) used to treat hair loss. Potential side effects, more commonly seen at higher doses, include: Increase in hair growth  (hypertrichosis) elsewhere on face and body Temporary hair shedding upon starting medication which may last up to 4 weeks Ankle swelling, fluid retention, rapid weight gain more than 5 pounds Low blood pressure and feeling lightheaded or dizzy when standing up quickly Fast or irregular heartbeat Headaches   Restart finasteride 5mg  take 1 tablet po QD dsp #90, 1Rf   Keloid  Related Medications triamcinolone acetonide (KENALOG) 10 MG/ML injection 20 mg   triamcinolone acetonide (KENALOG-40) injection 40 mg   Androgenetic alopecia    Return in about 6 months (around 07/06/2023) for Alopecia Follow Up, Melasma Follow Up, Keloid Follow Up.  I, Lawson Radar, CMA, am acting as scribe for Willeen Niece, MD.   Documentation: I have reviewed the above documentation for accuracy and completeness, and I agree with the above.  Willeen Niece, MD

## 2023-06-29 ENCOUNTER — Ambulatory Visit: Payer: 59 | Admitting: Physician Assistant

## 2023-07-10 ENCOUNTER — Ambulatory Visit: Payer: 59 | Admitting: Dermatology

## 2023-07-17 ENCOUNTER — Ambulatory Visit: Payer: 59 | Admitting: Dermatology

## 2023-07-18 ENCOUNTER — Ambulatory Visit: Payer: 59 | Admitting: Physician Assistant

## 2023-07-20 ENCOUNTER — Ambulatory Visit: Payer: 59 | Admitting: Physician Assistant

## 2023-07-20 VITALS — BP 151/84 | HR 72

## 2023-07-20 DIAGNOSIS — N3281 Overactive bladder: Secondary | ICD-10-CM

## 2023-07-20 DIAGNOSIS — R399 Unspecified symptoms and signs involving the genitourinary system: Secondary | ICD-10-CM

## 2023-07-20 DIAGNOSIS — K5900 Constipation, unspecified: Secondary | ICD-10-CM

## 2023-07-20 DIAGNOSIS — R310 Gross hematuria: Secondary | ICD-10-CM | POA: Diagnosis not present

## 2023-07-20 LAB — URINALYSIS, COMPLETE
Bilirubin, UA: NEGATIVE
Glucose, UA: NEGATIVE
Ketones, UA: NEGATIVE
Leukocytes,UA: NEGATIVE
Nitrite, UA: NEGATIVE
Protein,UA: NEGATIVE
Specific Gravity, UA: 1.02 (ref 1.005–1.030)
Urobilinogen, Ur: 0.2 mg/dL (ref 0.2–1.0)
pH, UA: 6 (ref 5.0–7.5)

## 2023-07-20 LAB — MICROSCOPIC EXAMINATION

## 2023-07-20 MED ORDER — MIRABEGRON ER 50 MG PO TB24: 50.0000 mg | ORAL_TABLET | Freq: Every day | ORAL | 11 refills | Status: DC

## 2023-07-20 NOTE — Progress Notes (Signed)
 07/20/2023 10:37 AM   Jackie Hunter 07-10-1946 [id number]  CC: Chief Complaint  Patient presents with   Follow-up   HPI: Jackie Hunter is a 78 y.o. female with PMH gross hematuria with benign workup in 2017 and negative cystoscopy with Dr. Francisca in November 2023 and OAB dry previously on Myrbetriq  50 mg who presents today for follow-up.   CT urogram dated 12/23/2022 was notable for a punctate left renal stone and multiple bilateral renal lesions, too small to characterize.  Today she reports no further episodes of gross hematuria or dysuria since her last office visit.  She remains off Myrbetriq  and complains of bothersome frequency, urge incontinence, and nocturia x 4-5.  She reports new constipation over the past several months.  Her stools are dark green in color and she denies melena or hematochezia.  She states she had a colonoscopy about 2 years ago.  In-office catheterized UA today positive for 2+ blood; urine microscopy with 3-10 RBCs/HPF.  PMH: Past Medical History:  Diagnosis Date   Adenoma of colon    Allergic rhinitis    Anemia    Anxiety    Arthritis    Asthma    Back pain    Carpal tunnel syndrome    Carpal tunnel syndrome    Carpal tunnel syndrome    Cataract cortical, senile    Cervicalgia    Chronic kidney disease    STAGE 3   CKD (chronic kidney disease)    DDD (degenerative disc disease), lumbar    DDD (degenerative disc disease), lumbar    Diabetes mellitus without complication (HCC)    does not take any medications   Diverticulosis    Gastritis    GERD (gastroesophageal reflux disease)    Headache    Hiatal hernia    History of hiatal hernia    Hypertension    Lipids serum increased    Lumbago    Migraines    Obesity    Osteoarthritis    Stroke [facility])    TIA (transient ischemic attack)    Wrist fracture     Surgical History: Past Surgical History:  Procedure Laterality Date   BREAST BIOPSY Right 2014   NEG   BREAST BIOPSY  Left 11/24/2016   us  core, radial scar    BREAST EXCISIONAL BIOPSY Left 1967   NEG   cataracts Bilateral    COLONOSCOPY WITH PROPOFOL  N/A 05/03/2016   Procedure: COLONOSCOPY WITH PROPOFOL ;  Surgeon: Gladis RAYMOND Mariner, MD;  Location: [facility] ENDOSCOPY;  Service: Endoscopy;  Laterality: N/A;   COLONOSCOPY WITH PROPOFOL  N/A 11/01/2016   Procedure: COLONOSCOPY WITH PROPOFOL ;  Surgeon: Gladis RAYMOND Mariner, MD;  Location: [facility] ENDOSCOPY;  Service: Endoscopy;  Laterality: N/A;   COLONOSCOPY WITH PROPOFOL  N/A 11/02/2016   Procedure: COLONOSCOPY WITH PROPOFOL ;  Surgeon: Gladis RAYMOND Mariner, MD;  Location: [facility] ENDOSCOPY;  Service: Endoscopy;  Laterality: N/A;   COLONOSCOPY WITH PROPOFOL  N/A 12/01/2017   Procedure: COLONOSCOPY WITH PROPOFOL ;  Surgeon: Mariner Gladis RAYMOND, MD;  Location: [facility] ENDOSCOPY;  Service: Endoscopy;  Laterality: N/A;   COLONOSCOPY WITH PROPOFOL  N/A 03/02/2018   Procedure: COLONOSCOPY WITH PROPOFOL ;  Surgeon: Mariner Gladis RAYMOND, MD;  Location: [facility] ENDOSCOPY;  Service: Endoscopy;  Laterality: N/A;   COLONOSCOPY WITH PROPOFOL  N/A 12/28/2020   Procedure: COLONOSCOPY WITH PROPOFOL ;  Surgeon: Unk Corinn Skiff, MD;  Location: [facility] ENDOSCOPY;  Service: Gastroenterology;  Laterality: N/A;   ESOPHAGOGASTRODUODENOSCOPY (EGD) WITH PROPOFOL  N/A 10/26/2015   Procedure: ESOPHAGOGASTRODUODENOSCOPY (EGD) WITH PROPOFOL ;  Surgeon:  Deward CINDERELLA Piedmont, MD;  Location: [facility] ENDOSCOPY;  Service: Gastroenterology;  Laterality: N/A;   ESOPHAGOGASTRODUODENOSCOPY (EGD) WITH PROPOFOL  N/A 02/12/2019   Procedure: ESOPHAGOGASTRODUODENOSCOPY (EGD) WITH PROPOFOL ;  Surgeon: Gaylyn Gladis PENNER, MD;  Location: [facility] ENDOSCOPY;  Service: Endoscopy;  Laterality: N/A;   EYE SURGERY     cataract surgery   LIPOMA EXCISION Right 02/17/2022   Procedure: EXCISION LIPOMA;  Surgeon: Tye Millet, DO;  Location: [facility] ORS;  Service: General;  Laterality: Right;   OOPHORECTOMY     left   OOPHORECTOMY Left     Home Medications:   Allergies as of 07/20/2023       Reactions   Levaquin [levofloxacin In D5w] Other (See Comments)   dizziness   Levofloxacin    Other reaction(s): Dizziness dizziness   Meloxicam    Other reaction(s): Other (See Comments) GI upset   Codeine Anxiety, Other (See Comments)   Other reaction(s): Unknown (See Comments) Increased heart rate Difficulty breathing; arrythmia   Sucralfate  Nausea Only        Medication List        Accurate as of July 20, 2023 10:37 AM. If you have any questions, ask your nurse or doctor.          acetaminophen  325 MG tablet Commonly known as: TYLENOL  Take 2 tablets (650 mg total) by mouth every 6 (six) hours as needed for mild pain, fever or headache.   ALPRAZolam  0.25 MG tablet Commonly known as: XANAX  Take 0.25 mg by mouth at bedtime as needed for anxiety.   ARIPiprazole 2 MG tablet Commonly known as: ABILIFY Take 2 mg by mouth daily.   ascorbic acid 500 MG tablet Commonly known as: VITAMIN C Take 500 mg by mouth daily.   aspirin EC 81 MG tablet Take 81 mg by mouth as needed. Swallow whole.   Azelaic Acid  15 % gel Apply QAM to face, wash off at bedtime   BLACK ELDERBERRY PO Take by mouth as needed.   buPROPion  300 MG 24 hr tablet Commonly known as: WELLBUTRIN  XL Take 300 mg by mouth daily.   CRANBERRY PLUS VITAMIN C PO Take 1 tablet by mouth as needed.   cyanocobalamin  1000 MCG tablet Commonly known as: VITAMIN B12 Take 1,000 mcg by mouth daily.   estradiol 0.1 MG/GM vaginal cream Commonly known as: ESTRACE Place vaginally.   ferrous sulfate 325 (65 FE) MG EC tablet Take 1 tablet by mouth every morning.   fexofenadine 60 MG tablet Commonly known as: ALLEGRA Take 60 mg by mouth as needed.   finasteride  5 MG tablet Commonly known as: PROSCAR  Take 1 tablet (5 mg total) by mouth daily.   lisinopril-hydrochlorothiazide 10-12.5 MG tablet Commonly known as: ZESTORETIC Take 1 tablet by mouth daily.   Lithium Orotate  5 MG Caps Take by mouth as needed.   minoxidil  2.5 MG tablet Commonly known as: LONITEN  Take 1 tablet (2.5 mg total) by mouth daily.   mirabegron  ER 50 MG Tb24 tablet Commonly known as: MYRBETRIQ  Take 1 tablet (50 mg total) by mouth daily.   Omega-3 1000 MG Caps Take 1 capsule by mouth as needed.   pantoprazole  40 MG tablet Commonly known as: Protonix  Take 1 tablet (40 mg total) by mouth daily.   traZODone 50 MG tablet Commonly known as: DESYREL Take 1 tablet by mouth at bedtime.        Allergies:  Allergies  Allergen Reactions   Levaquin [Levofloxacin In D5w] Other (See Comments)    dizziness  Levofloxacin     Other reaction(s): Dizziness dizziness   Meloxicam     Other reaction(s): Other (See Comments) GI upset   Codeine Anxiety and Other (See Comments)    Other reaction(s): Unknown (See Comments) Increased heart rate  Difficulty breathing; arrythmia   Sucralfate  Nausea Only    Family History: Family History  Problem Relation Age of Onset   Leukemia Mother    Arthritis Mother    Leukemia Nephew    Kidney cancer Neg Hx    Prostate cancer Neg Hx    Breast cancer Neg Hx     Social History:   reports that she has never smoked. She has never been exposed to tobacco smoke. She has never used smokeless tobacco. She reports current alcohol use. She reports that she does not use drugs.  Physical Exam: BP (!) 151/84   Pulse 72   Constitutional:  Alert and oriented, no acute distress, nontoxic appearing HEENT: [place], AT Cardiovascular: No clubbing, cyanosis, or edema Respiratory: Normal respiratory effort, no increased work of breathing Skin: No rashes, bruises or suspicious lesions Neurologic: Grossly intact, no focal deficits, moving all 4 extremities Psychiatric: Normal mood and affect  Laboratory Data: Results for orders placed or performed in visit on 07/20/23  Microscopic Examination   Collection Time: 07/20/23 10:52 AM   Urine  Result Value Ref  Range   WBC, UA 0-5 0 - 5 /hpf   RBC, Urine 3-10 (A) 0 - 2 /hpf   Epithelial Cells (non renal) 0-10 0 - 10 /hpf   Bacteria, UA Few None seen/Few  Urinalysis, Complete   Collection Time: 07/20/23 10:52 AM  Result Value Ref Range   Specific Gravity, UA 1.020 1.005 - 1.030   pH, UA 6.0 5.0 - 7.5   Color, UA Yellow Yellow   Appearance Ur Clear Clear   Leukocytes,UA Negative Negative   Protein,UA Negative Negative/Trace   Glucose, UA Negative Negative   Ketones, UA Negative Negative   RBC, UA 2+ (A) Negative   Bilirubin, UA Negative Negative   Urobilinogen, Ur 0.2 0.2 - 1.0 mg/dL   Nitrite, UA Negative Negative   Microscopic Examination See below:    Assessment & Plan:   1. OAB (overactive bladder) (Primary) OAB wet of pharmacotherapy.  She would like to resume medication, so I am refilling her Myrbetriq  50 mg, which previously worked well for her. - Urinalysis, Complete - mirabegron  ER (MYRBETRIQ ) 50 MG TB24 tablet; Take 1 tablet (50 mg total) by mouth daily.  Dispense: 30 tablet; Refill: 11  2. Gross hematuria Microscopic hematuria again noted on cath UA today, no further episodes of gross hematuria.  I offered her repeat cystoscopy and she would like to do this, but wait until the weather warms up a bit.  We discussed that if she has further episodes of gross hematuria in the meantime, I would like her to make me aware so we can move her appointment up sooner. - Urinalysis, Complete  3. Constipation, unspecified constipation type Will refer to GI per patient request. - Ambulatory referral to Gastroenterology   Return in about 3 months (around 10/18/2023) for Cysto with Dr. Francisca.  Lucie Hones, PA-C  [facility] [place] [address] [place], [place] [address] [phone]

## 2023-07-20 NOTE — Progress Notes (Signed)
 In and Out Catheterization  Patient is present today for a I & O catheterization due to UTI symptoms. Patient was cleaned and prepped in a sterile fashion with betadine . A 14FR cath was inserted no complications were noted , 60 ml of urine return was noted, urine was yellow clear in color. A clean urine sample was collected for UA. Bladder was drained  And catheter was removed with out difficulty.    Performed by: Agapito Hanway Magallon-Mariche, RMA

## 2023-07-20 NOTE — Patient Instructions (Signed)

## 2023-09-24 ENCOUNTER — Emergency Department

## 2023-09-24 ENCOUNTER — Emergency Department
Admission: EM | Admit: 2023-09-24 | Discharge: 2023-09-24 | Disposition: A | Discharge status: Home / Self Care | Attending: Emergency Medicine | Admitting: Emergency Medicine

## 2023-09-24 ENCOUNTER — Other Ambulatory Visit: Payer: Self-pay

## 2023-09-24 DIAGNOSIS — K625 Hemorrhage of anus and rectum: Secondary | ICD-10-CM | POA: Insufficient documentation

## 2023-09-24 DIAGNOSIS — R42 Dizziness and giddiness: Secondary | ICD-10-CM | POA: Diagnosis present

## 2023-09-24 LAB — BASIC METABOLIC PANEL
Anion gap: 8 (ref 5–15)
BUN: 22 mg/dL (ref 8–23)
CO2: 21 mmol/L — ABNORMAL LOW (ref 22–32)
Calcium: 8.5 mg/dL — ABNORMAL LOW (ref 8.9–10.3)
Chloride: 109 mmol/L (ref 98–111)
Creatinine, Ser: 1.36 mg/dL — ABNORMAL HIGH (ref 0.44–1.00)
GFR, Estimated: 40 mL/min — ABNORMAL LOW (ref >60)
Glucose, Bld: 91 mg/dL (ref 70–99)
Potassium: 3.9 mmol/L (ref 3.5–5.1)
Sodium: 138 mmol/L (ref 135–145)

## 2023-09-24 LAB — HEPATIC FUNCTION PANEL
ALT: 15 U/L (ref 0–44)
AST: 21 U/L (ref 15–41)
Albumin: 3.8 g/dL (ref 3.5–5.0)
Alkaline Phosphatase: 45 U/L (ref 38–126)
Bilirubin, Direct: <0.1 mg/dL (ref 0.0–0.2)
Total Bilirubin: 0.5 mg/dL (ref 0.0–1.2)
Total Protein: 7.1 g/dL (ref 6.5–8.1)

## 2023-09-24 LAB — CBC
HCT: 40.8 % (ref 36.0–46.0)
Hemoglobin: 13.4 g/dL (ref 12.0–15.0)
MCH: 31.9 pg (ref 26.0–34.0)
MCHC: 32.8 g/dL (ref 30.0–36.0)
MCV: 97.1 fL (ref 80.0–100.0)
Platelets: 380 10*3/uL (ref 150–400)
RBC: 4.2 MIL/uL (ref 3.87–5.11)
RDW: 14.6 % (ref 11.5–15.5)
WBC: 6.3 10*3/uL (ref 4.0–10.5)
nRBC: 0 % (ref 0.0–0.2)

## 2023-09-24 LAB — TYPE AND SCREEN
ABO/RH(D): O POS
Antibody Screen: NEGATIVE

## 2023-09-24 LAB — URINALYSIS, ROUTINE W REFLEX MICROSCOPIC
Bacteria, UA: NONE SEEN
Bilirubin Urine: NEGATIVE
Glucose, UA: NEGATIVE mg/dL
Ketones, ur: NEGATIVE mg/dL
Leukocytes,Ua: NEGATIVE
Nitrite: NEGATIVE
Protein, ur: NEGATIVE mg/dL
Specific Gravity, Urine: 1.016 (ref 1.005–1.030)
pH: 6 (ref 5.0–8.0)

## 2023-09-24 MED ORDER — IOHEXOL 300 MG/ML  SOLN
80.0000 mL | Freq: Once | INTRAMUSCULAR | Status: AC | PRN
Start: 2023-09-24 — End: 2023-09-24
  Administered 2023-09-24: 80 mL via INTRAVENOUS

## 2023-09-24 MED ORDER — PANTOPRAZOLE SODIUM 40 MG IV SOLR
40.0000 mg | Freq: Once | INTRAVENOUS | Status: AC
  Administered 2023-09-24: 40 mg via INTRAVENOUS
  Filled 2023-09-24: qty 10

## 2023-09-24 MED ORDER — SODIUM CHLORIDE 0.9 % IV BOLUS
1000.0000 mL | Freq: Once | INTRAVENOUS | Status: AC
  Administered 2023-09-24: 1000 mL via INTRAVENOUS

## 2023-09-24 NOTE — ED Notes (Signed)
 Asked patient to provide UA sample. Patient stated "not right now. I peed before I came over here and I don't drink a lot of water."

## 2023-09-24 NOTE — Discharge Instructions (Addendum)
 You are seen in the emergency department with concerns for a GI bleed.  Your hemoglobin level was stable in the emergency department.  Is importantly follow-up closely with your primary care physician to recheck your hemoglobin level to make sure it is not dropping.  You are given information to follow-up with gastroenterology.  Return to the emergency department if you have any worsening bleeding, lightheadedness or dizziness.  Your CT scan showed signs of diverticulosis.

## 2023-09-24 NOTE — ED Provider Notes (Signed)
 [facility] Provider Note    Event Date/Time   First MD Initiated Contact with Patient 09/24/23 1323     (approximate)   History   Dizziness   HPI  Jackie Hunter is a 78 y.o. female past significant for diverticulosis who presents to the emergency department with concern for GI bleed.  States that she was having a bowel movement today and after she had a bowel movement she noticed that there was dark red blood in the toilet seat.  States that she had 1 prior episode of a significant GI bleed that required hospitalization and a blood transfusion approximately 4 years ago.  States that she was told that she had diverticulosis.  Denies any history of cirrhosis.  No significant alcohol use.  No significant Goody powder or BC powder use.  No significant NSAID use.  Not on anticoagulation.  States that she has been having some dizziness over the past couple of weeks.  Denies any room spinning dizziness or headaches.  Complaining of mild left lower abdominal pain.     Physical Exam   Triage Vital Signs: ED Triage Vitals [09/24/23 1204]  Encounter Vitals Group     BP (!) 153/73     Systolic BP Percentile      Diastolic BP Percentile      Pulse Rate 79     Resp 18     Temp 98 F (36.7 C)     Temp src      SpO2 100 %     Weight      Height      Head Circumference      Peak Flow      Pain Score 0     Pain Loc      Pain Education      Exclude from Growth Chart     Most recent vital signs: Vitals:   09/24/23 1500 09/24/23 1555  BP: (!) 150/69   Pulse: 67   Resp: 20   Temp:  98.2 F (36.8 C)  SpO2: 100%     Physical Exam Exam conducted with a chaperone present.  Constitutional:      Appearance: She is well-developed.  HENT:     Head: Atraumatic.  Eyes:     Conjunctiva/sclera: Conjunctivae normal.  Cardiovascular:     Rate and Rhythm: Regular rhythm.  Pulmonary:     Effort: No respiratory distress.  Abdominal:     General: There is no  distension.     Tenderness: There is abdominal tenderness (Mild left lower quadrant).  Genitourinary:    Comments: Rectal exam with no melena, dark red blood, palpable internal hemorrhoid.  No obvious mass Musculoskeletal:        General: Normal range of motion.     Cervical back: Normal range of motion.  Skin:    General: Skin is warm.  Neurological:     Mental Status: She is alert. Mental status is at baseline.     IMPRESSION / MDM / ASSESSMENT AND PLAN / ED COURSE  I reviewed the triage vital signs and the nursing notes.  Differential diagnosis including lower GI bleed, diverticulosis, diverticulitis, anemia   EKG  I, Corena Herter, the attending physician, personally viewed and interpreted this ECG.   Rate: Normal  Rhythm: Normal sinus  Axis: Normal  Intervals: Normal  ST&T Change: None  No tachycardic or bradycardic dysrhythmias while on cardiac telemetry.  RADIOLOGY CT scan read as diverticulosis but no findings of  acute diverticulitis.  CT scan of head with no acute findings LABS (all labs ordered are listed, but only abnormal results are displayed) Labs interpreted as -    Labs Reviewed  BASIC METABOLIC PANEL - Abnormal; Notable for the following components:      Result Value   CO2 21 (*)    Creatinine, Ser 1.36 (*)    Calcium 8.5 (*)    GFR, Estimated 40 (*)    All other components within normal limits  URINALYSIS, ROUTINE W REFLEX MICROSCOPIC - Abnormal; Notable for the following components:   Color, Urine YELLOW (*)    APPearance CLEAR (*)    Hgb urine dipstick SMALL (*)    All other components within normal limits  CBC  HEPATIC FUNCTION PANEL  CBG MONITORING, ED  TYPE AND SCREEN     MDM  No leukocytosis.  Normal BUN.  Hemoglobin is stable at 13.4.  No signs or symptoms of upper GI bleed.  CT scan of the abdomen and pelvis with findings of diverticulosis but no findings of acute diverticulitis.  Patient without further episodes of bloody stool  in the emergency department.  Have a low suspicion for upper GI bleed, normal BUN and no melena.  Discussed close follow-up with primary care physician and given information to follow-up closely with gastroenterology.  Discussed return to the emergency department if she had ongoing or worsening symptoms     PROCEDURES:  Critical Care performed: No  Procedures  Patient's presentation is most consistent with acute presentation with potential threat to life or bodily function.   MEDICATIONS ORDERED IN ED: Medications  sodium chloride 0.9 % bolus 1,000 mL (1,000 mLs Intravenous New Bag/Given 09/24/23 1518)  pantoprazole (PROTONIX) injection 40 mg (40 mg Intravenous Given 09/24/23 1518)  iohexol (OMNIPAQUE) 300 MG/ML solution 80 mL (80 mLs Intravenous Contrast Given 09/24/23 1522)    FINAL CLINICAL IMPRESSION(S) / ED DIAGNOSES   Final diagnoses:  BRBPR (bright red blood per rectum)  Dizzy     Rx / DC Orders   ED Discharge Orders     None        Note:  This document was prepared using Dragon voice recognition software and may include unintentional dictation errors.   Corena Herter, MD 09/24/23 1558

## 2023-09-24 NOTE — ED Triage Notes (Addendum)
 Pt comes with c/o dizziness for two weeks. Pt states bloody stool this morning today. Pt not on thinners. Pt denies any cp or sob. Pt states some pain in lower back.

## 2023-09-24 NOTE — ED Notes (Signed)
Transferred to CT

## 2023-09-25 ENCOUNTER — Telehealth: Payer: Self-pay

## 2023-09-25 NOTE — Telephone Encounter (Signed)
 The patient called to schedule an office visit. I informed her that she is a patient of [facility] and would need to contact them directly. The patient mentioned that she had already called [facility] but did not receive an answer. I called [facility] on her behalf and was able to connect her with a representative. The representative confirmed that they had spoken with the patient earlier that morning and would be happy to assist her further.

## 2023-10-12 ENCOUNTER — Encounter: Payer: Self-pay | Admitting: Urology

## 2023-10-12 ENCOUNTER — Ambulatory Visit (INDEPENDENT_AMBULATORY_CARE_PROVIDER_SITE_OTHER): Payer: 59 | Admitting: Urology

## 2023-10-12 VITALS — BP 145/76 | HR 81 | Ht 63.0 in | Wt 155.0 lb

## 2023-10-12 DIAGNOSIS — R310 Gross hematuria: Secondary | ICD-10-CM

## 2023-10-12 MED ORDER — CEPHALEXIN 500 MG PO CAPS
500.0000 mg | ORAL_CAPSULE | Freq: Once | ORAL | Status: AC
  Administered 2023-10-12: 500 mg via ORAL

## 2023-10-12 NOTE — Progress Notes (Signed)
 Cystoscopy Procedure Note:  Indication: Microscopic hematuria  78 year old female with long history of asymptomatic microscopic hematuria with negative workup in 2017 and 2023.  She has had a questionable history of gross hematuria.  Most recently she reported gross hematuria to our PA [facility]-repeat   After informed consent and discussion of the procedure and its risks, Jackie Hunter was positioned and prepped in the standard fashion. Cystoscopy was performed with a flexible cystoscope. The urethra, bladder neck and entire bladder was visualized in a standard fashion. The ureteral orifices were visualized in their normal location and orientation.  Bladder mucosa grossly normal throughout  Imaging: CT urogram from June 2024 was benign, repeat CT abdomen pelvis with contrast from March 2025 performed in the ER for abdominal pain was also benign.  Findings: Normal cystoscopy  Assessment and Plan: Would not recommend further cystoscopy procedures with her extensive history of negative workup of microscopic hematuria Continue Myrbetriq for OAB RTC PA 6 months symptom check  Legrand Rams, MD 10/12/2023

## 2023-10-19 ENCOUNTER — Encounter: Payer: Self-pay | Admitting: Gastroenterology

## 2023-10-23 ENCOUNTER — Encounter: Payer: Self-pay | Admitting: Gastroenterology

## 2023-10-30 ENCOUNTER — Encounter
Admission: RE | Disposition: A | Discharge status: Home / Self Care | Payer: Self-pay | Source: Ambulatory Visit | Attending: Gastroenterology

## 2023-10-30 ENCOUNTER — Ambulatory Visit: Admitting: Anesthesiology

## 2023-10-30 ENCOUNTER — Ambulatory Visit
Admission: RE | Admit: 2023-10-30 | Discharge: 2023-10-30 | Disposition: A | Discharge status: Home / Self Care | Source: Ambulatory Visit | Attending: Gastroenterology | Admitting: Gastroenterology

## 2023-10-30 ENCOUNTER — Encounter: Payer: Self-pay | Admitting: Gastroenterology

## 2023-10-30 DIAGNOSIS — E1122 Type 2 diabetes mellitus with diabetic chronic kidney disease: Secondary | ICD-10-CM | POA: Insufficient documentation

## 2023-10-30 DIAGNOSIS — K633 Ulcer of intestine: Secondary | ICD-10-CM | POA: Insufficient documentation

## 2023-10-30 DIAGNOSIS — K5730 Diverticulosis of large intestine without perforation or abscess without bleeding: Secondary | ICD-10-CM | POA: Insufficient documentation

## 2023-10-30 DIAGNOSIS — Z79899 Other long term (current) drug therapy: Secondary | ICD-10-CM | POA: Insufficient documentation

## 2023-10-30 DIAGNOSIS — Z8673 Personal history of transient ischemic attack (TIA), and cerebral infarction without residual deficits: Secondary | ICD-10-CM | POA: Diagnosis not present

## 2023-10-30 DIAGNOSIS — K219 Gastro-esophageal reflux disease without esophagitis: Secondary | ICD-10-CM | POA: Insufficient documentation

## 2023-10-30 DIAGNOSIS — K625 Hemorrhage of anus and rectum: Secondary | ICD-10-CM | POA: Diagnosis present

## 2023-10-30 DIAGNOSIS — K921 Melena: Secondary | ICD-10-CM | POA: Insufficient documentation

## 2023-10-30 DIAGNOSIS — I129 Hypertensive chronic kidney disease with stage 1 through stage 4 chronic kidney disease, or unspecified chronic kidney disease: Secondary | ICD-10-CM | POA: Insufficient documentation

## 2023-10-30 DIAGNOSIS — K449 Diaphragmatic hernia without obstruction or gangrene: Secondary | ICD-10-CM | POA: Insufficient documentation

## 2023-10-30 DIAGNOSIS — N189 Chronic kidney disease, unspecified: Secondary | ICD-10-CM | POA: Diagnosis not present

## 2023-10-30 HISTORY — PX: COLONOSCOPY: SHX5424

## 2023-10-30 HISTORY — PX: SUBMUCOSAL INJECTION: SHX5543

## 2023-10-30 LAB — GLUCOSE, CAPILLARY
Glucose-Capillary: 49 mg/dL — ABNORMAL LOW (ref 70–99)
Glucose-Capillary: 146 mg/dL — ABNORMAL HIGH (ref 70–99)

## 2023-10-30 SURGERY — COLONOSCOPY
Anesthesia: General

## 2023-10-30 MED ORDER — LIDOCAINE HCL (PF) 2 % IJ SOLN
INTRAMUSCULAR | Status: AC
  Filled 2023-10-30: qty 5

## 2023-10-30 MED ORDER — SPOT INK MARKER SYRINGE KIT
PACK | SUBMUCOSAL | Status: DC | PRN
  Administered 2023-10-30: 1 mL via SUBMUCOSAL

## 2023-10-30 MED ORDER — DEXTROSE 50 % IV SOLN
INTRAVENOUS | Status: AC
  Filled 2023-10-30: qty 50

## 2023-10-30 MED ORDER — DEXTROSE 50 % IV SOLN: 50.0000 mL | Freq: Once | INTRAVENOUS | Status: DC

## 2023-10-30 MED ORDER — SODIUM CHLORIDE 0.9 % IV SOLN
INTRAVENOUS | Status: DC
  Administered 2023-10-30: 20 mL/h via INTRAVENOUS

## 2023-10-30 MED ORDER — LIDOCAINE HCL (CARDIAC) PF 100 MG/5ML IV SOSY
PREFILLED_SYRINGE | INTRAVENOUS | Status: DC | PRN
  Administered 2023-10-30: 60 mg via INTRAVENOUS

## 2023-10-30 MED ORDER — PROPOFOL 500 MG/50ML IV EMUL
INTRAVENOUS | Status: DC | PRN
  Administered 2023-10-30: 50 ug/kg/min via INTRAVENOUS

## 2023-10-30 MED ORDER — DEXTROSE 50 % IV SOLN
25.0000 mL | Freq: Once | INTRAVENOUS | Status: AC
  Administered 2023-10-30: 50 mL via INTRAVENOUS

## 2023-10-30 MED ORDER — PHENYLEPHRINE 80 MCG/ML (10ML) SYRINGE FOR IV PUSH (FOR BLOOD PRESSURE SUPPORT)
PREFILLED_SYRINGE | INTRAVENOUS | Status: AC
  Filled 2023-10-30: qty 10

## 2023-10-30 MED ORDER — PROPOFOL 10 MG/ML IV BOLUS
INTRAVENOUS | Status: DC | PRN
  Administered 2023-10-30 (×2): 10 mg via INTRAVENOUS
  Administered 2023-10-30: 30 mg via INTRAVENOUS

## 2023-10-30 MED ORDER — PROPOFOL 1000 MG/100ML IV EMUL
INTRAVENOUS | Status: AC
Start: 2023-10-30 — End: ?
  Filled 2023-10-30: qty 100

## 2023-10-30 NOTE — Interval H&P Note (Signed)
 History and Physical Interval Note: Preprocedure H&P from 10/30/23  was reviewed and there was no interval change after seeing and examining the patient.  Written consent was obtained from the patient after discussion of risks, benefits, and alternatives. Patient has consented to proceed with Colonoscopy with possible intervention    10/30/2023 12:49 PM  Jackie Hunter  has presented today for surgery, with the diagnosis of Rectal bleeding (K62.5).  The various methods of treatment have been discussed with the patient and family. After consideration of risks, benefits and other options for treatment, the patient has consented to  Procedure(s): COLONOSCOPY (N/A) as a surgical intervention.  The patient's history has been reviewed, patient examined, no change in status, stable for surgery.  I have reviewed the patient's chart and labs.  Questions were answered to the patient's satisfaction.     Quintin Buckle

## 2023-10-30 NOTE — H&P (Addendum)
 Pre-Procedure H&P   Patient ID: Jackie Hunter is a 78 y.o. female.  Gastroenterology Provider: Quintin Buckle, DO  Referring Provider: Laquetta Plank, PA PCP: Antonio Baumgarten, MD  Date: 10/30/2023  HPI Ms. Jackie Hunter is a 78 y.o. female who presents today for Colonoscopy for Bright red blood per rectum .  Mild llq discomfort. CT in March 2025 negative for acute diverticulitis.  Bowel movements twice a day to every 2 days.  Weight and appetite been stable.  No melena.  Since her appointment with Leola Raisin, the blood and pain have resolved.  Last colonoscopy in June 2022 for hematochezia.  Diverticular bleeding.  Blood was noted from hepatic flexure to the rectum with no source found.  Colonoscopy August 2019 demonstrated pandiverticulosis.  2017 demonstrated 1 adenomatous polyp She had 2 diverticular bleeds in 2022   Past Medical History:  Diagnosis Date   Adenoma of colon    Allergic rhinitis    Anemia    Anxiety    Arthritis    Asthma    Back pain    Carpal tunnel syndrome    Carpal tunnel syndrome    Carpal tunnel syndrome    Cataract cortical, senile    Cervicalgia    Chronic kidney disease    STAGE 3   CKD (chronic kidney disease)    DDD (degenerative disc disease), lumbar    Diabetes mellitus without complication (HCC)    does not take any medications   Diverticulosis    Gastritis    GERD (gastroesophageal reflux disease)    Headache    History of hiatal hernia    Hypertension    Lipids serum increased    Lumbago    Migraines    Obesity    Osteoarthritis    Stroke [facility])    TIA (transient ischemic attack)    Wrist fracture     Past Surgical History:  Procedure Laterality Date   BREAST BIOPSY Right 2014   NEG   BREAST BIOPSY Left 11/24/2016   us  core, radial scar    BREAST EXCISIONAL BIOPSY Left 1967   NEG   cataracts Bilateral    COLONOSCOPY WITH PROPOFOL N/A 05/03/2016   Procedure: COLONOSCOPY WITH PROPOFOL;  Surgeon: Deveron Fly, MD;  Location: [facility] ENDOSCOPY;  Service: Endoscopy;  Laterality: N/A;   COLONOSCOPY WITH PROPOFOL N/A 11/01/2016   Procedure: COLONOSCOPY WITH PROPOFOL;  Surgeon: Deveron Fly, MD;  Location: [facility] ENDOSCOPY;  Service: Endoscopy;  Laterality: N/A;   COLONOSCOPY WITH PROPOFOL N/A 11/02/2016   Procedure: COLONOSCOPY WITH PROPOFOL;  Surgeon: Deveron Fly, MD;  Location: [facility] ENDOSCOPY;  Service: Endoscopy;  Laterality: N/A;   COLONOSCOPY WITH PROPOFOL N/A 12/01/2017   Procedure: COLONOSCOPY WITH PROPOFOL;  Surgeon: Deveron Fly, MD;  Location: [facility] ENDOSCOPY;  Service: Endoscopy;  Laterality: N/A;   COLONOSCOPY WITH PROPOFOL N/A 03/02/2018   Procedure: COLONOSCOPY WITH PROPOFOL;  Surgeon: Deveron Fly, MD;  Location: [facility] ENDOSCOPY;  Service: Endoscopy;  Laterality: N/A;   COLONOSCOPY WITH PROPOFOL N/A 12/28/2020   Procedure: COLONOSCOPY WITH PROPOFOL;  Surgeon: Selena Daily, MD;  Location: [facility] ENDOSCOPY;  Service: Gastroenterology;  Laterality: N/A;   ESOPHAGOGASTRODUODENOSCOPY (EGD) WITH PROPOFOL N/A 10/26/2015   Procedure: ESOPHAGOGASTRODUODENOSCOPY (EGD) WITH PROPOFOL;  Surgeon: Stephens Eis, MD;  Location: [facility] ENDOSCOPY;  Service: Gastroenterology;  Laterality: N/A;   ESOPHAGOGASTRODUODENOSCOPY (EGD) WITH PROPOFOL N/A 02/12/2019   Procedure: ESOPHAGOGASTRODUODENOSCOPY (EGD) WITH PROPOFOL;  Surgeon: Deveron Fly, MD;  Location: [facility] ENDOSCOPY;  Service: Endoscopy;  Laterality: N/A;   EYE SURGERY     cataract surgery   LIPOMA EXCISION Right 02/17/2022   Procedure: EXCISION LIPOMA;  Surgeon: Conrado Delay, DO;  Location: [facility] ORS;  Service: General;  Laterality: Right;   OOPHORECTOMY     left    Family History No h/o GI disease or malignancy  Review of Systems  Constitutional:  Negative for activity change, appetite change, chills, diaphoresis, fatigue, fever and unexpected weight change.  HENT:  Negative for trouble swallowing and voice change.    Respiratory:  Negative for shortness of breath and wheezing.   Cardiovascular:  Negative for chest pain, palpitations and leg swelling.  Gastrointestinal:  Positive for abdominal pain and anal bleeding. Negative for abdominal distention, blood in stool, constipation, diarrhea, nausea, rectal pain and vomiting.  Musculoskeletal:  Negative for arthralgias and myalgias.  Skin:  Negative for color change and pallor.  Neurological:  Negative for dizziness, syncope and weakness.  Psychiatric/Behavioral:  Negative for confusion.   All other systems reviewed and are negative.    Medications No current facility-administered medications on file prior to encounter.   Current Outpatient Medications on File Prior to Encounter  Medication Sig Dispense Refill   acetaminophen (TYLENOL) 325 MG tablet Take 2 tablets (650 mg total) by mouth every 6 (six) hours as needed for mild pain, fever or headache.     ALPRAZolam (XANAX) 0.25 MG tablet Take 0.25 mg by mouth at bedtime as needed for anxiety.     ARIPiprazole (ABILIFY) 2 MG tablet Take 2 mg by mouth daily.     aspirin EC 81 MG tablet Take 81 mg by mouth as needed. Swallow whole.     Azelaic Acid 15 % gel Apply QAM to face, wash off at bedtime 50 g 2   BLACK ELDERBERRY PO Take by mouth as needed.     buPROPion (WELLBUTRIN XL) 300 MG 24 hr tablet Take 300 mg by mouth daily.     Cranberry-Vitamin C-Vitamin E (CRANBERRY PLUS VITAMIN C PO) Take 1 tablet by mouth as needed.     estradiol (ESTRACE) 0.1 MG/GM vaginal cream Place vaginally.     ferrous sulfate 325 (65 FE) MG EC tablet Take 1 tablet by mouth every morning.     fexofenadine (ALLEGRA) 60 MG tablet Take 60 mg by mouth as needed.     finasteride (PROSCAR) 5 MG tablet Take 1 tablet (5 mg total) by mouth daily. 90 tablet 1   Lithium Orotate 5 MG CAPS Take by mouth as needed.     minoxidil (LONITEN) 2.5 MG tablet Take 1 tablet (2.5 mg total) by mouth daily. 90 tablet 1   mirabegron ER (MYRBETRIQ) 50  MG TB24 tablet Take 1 tablet (50 mg total) by mouth daily. 30 tablet 11   Omega-3 1000 MG CAPS Take 1 capsule by mouth as needed.     pantoprazole (PROTONIX) 40 MG tablet Take 1 tablet (40 mg total) by mouth daily. 30 tablet 1   traZODone (DESYREL) 50 MG tablet Take 1 tablet by mouth at bedtime.     vitamin B-12 (CYANOCOBALAMIN) 1000 MCG tablet Take 1,000 mcg by mouth daily.     vitamin C (ASCORBIC ACID) 500 MG tablet Take 500 mg by mouth daily.      Pertinent medications related to GI and procedure were reviewed by me with the patient prior to the procedure   Current Facility-Administered Medications:    0.9 %  sodium chloride infusion, , Intravenous, Continuous, Mamie Searles,  Eugena Herter, DO, Last Rate: 20 mL/hr at 10/30/23 1239, Continued from Pre-op at 10/30/23 1239   dextrose 50 % solution 50 mL, 50 mL, Intravenous, Once, Enrique Harvest, MD  sodium chloride 20 mL/hr at 10/30/23 1239       Allergies  Allergen Reactions   Levaquin [Levofloxacin In D5w] Other (See Comments)    dizziness   Levofloxacin     Other reaction(s): Dizziness dizziness   Meloxicam     Other reaction(s): Other (See Comments) GI upset   Codeine Anxiety and Other (See Comments)    Other reaction(s): Unknown (See Comments) Increased heart rate  Difficulty breathing; arrythmia   Sucralfate Nausea Only   Allergies were reviewed by me prior to the procedure  Objective   Body mass index is 26.02 kg/m. Vitals:   10/30/23 1151  BP: 139/60  Pulse: 82  Resp: 20  Temp: (!) 96.9 F (36.1 C)  TempSrc: Temporal  SpO2: 100%  Weight: 68.8 kg  Height: 5\' 4"  (1.626 m)     Physical Exam Vitals and nursing note reviewed.  Constitutional:      General: She is not in acute distress.    Appearance: Normal appearance. She is not ill-appearing, toxic-appearing or diaphoretic.  HENT:     Head: Normocephalic and atraumatic.     Nose: Nose normal.     Mouth/Throat:     Mouth: Mucous membranes are moist.      Pharynx: Oropharynx is clear.  Eyes:     General: No scleral icterus.    Extraocular Movements: Extraocular movements intact.  Cardiovascular:     Rate and Rhythm: Normal rate and regular rhythm.     Heart sounds: Normal heart sounds. No murmur heard.    No friction rub. No gallop.  Pulmonary:     Effort: Pulmonary effort is normal. No respiratory distress.     Breath sounds: Normal breath sounds. No wheezing, rhonchi or rales.  Abdominal:     General: Bowel sounds are normal. There is no distension.     Palpations: Abdomen is soft.     Tenderness: There is no abdominal tenderness. There is no guarding or rebound.  Musculoskeletal:     Cervical back: Neck supple.     Right lower leg: No edema.     Left lower leg: No edema.  Skin:    General: Skin is warm and dry.     Coloration: Skin is not jaundiced or pale.  Neurological:     General: No focal deficit present.     Mental Status: She is alert and oriented to person, place, and time. Mental status is at baseline.  Psychiatric:        Mood and Affect: Mood normal.        Behavior: Behavior normal.        Thought Content: Thought content normal.        Judgment: Judgment normal.      Assessment:  Jackie Hunter is a 78 y.o. female  who presents today for Colonoscopy for Bright red blood per rectum .  Plan:  Colonoscopy with possible intervention today  Colonoscopy with possible biopsy, control of bleeding, polypectomy, and interventions as necessary has been discussed with the patient/patient representative. Informed consent was obtained from the patient/patient representative after explaining the indication, nature, and risks of the procedure including but not limited to death, bleeding, perforation, missed neoplasm/lesions, cardiorespiratory compromise, and reaction to medications. Opportunity for questions was given and appropriate answers were provided. Patient/patient  representative has verbalized understanding is  amenable to undergoing the procedure.   Quintin Buckle, DO  [facility] Gastroenterology  Portions of the record may have been created with voice recognition software. Occasional wrong-word or 'sound-a-like' substitutions may have occurred due to the inherent limitations of voice recognition software.  Read the chart carefully and recognize, using context, where substitutions may have occurred.

## 2023-10-30 NOTE — Anesthesia Postprocedure Evaluation (Signed)
 Anesthesia Post Note  Patient: EUVA RUNDELL  Procedure(s) Performed: COLONOSCOPY INJECTION, SUBMUCOSAL  Patient location during evaluation: PACU Anesthesia Type: General Level of consciousness: awake and alert, oriented and patient cooperative Pain management: pain level controlled Vital Signs Assessment: post-procedure vital signs reviewed and stable Respiratory status: spontaneous breathing, nonlabored ventilation and respiratory function stable Cardiovascular status: blood pressure returned to baseline and stable Postop Assessment: adequate PO intake Anesthetic complications: no   No notable events documented.   Last Vitals:  Vitals:   10/30/23 1342 10/30/23 1352  BP: 131/76 (!) 146/84  Pulse: 67 72  Resp: 14 17  Temp:    SpO2: 100% 100%    Last Pain:  Vitals:   10/30/23 1352  TempSrc:   PainSc: 0-No pain                 Dorothey Gate

## 2023-10-30 NOTE — Op Note (Signed)
 [facility] Gastroenterology Patient Name: Jackie Hunter Procedure Date: 10/30/2023 11:57 AM MRN: [id number] Account #: [id number] Date of Birth: [birth year] Admit Type: Outpatient Age: 78 Room: [facility] ENDO ROOM 2 Gender: Female Note Status: Supervisor Override Instrument Name: Peds Colonoscope [id number] Procedure:             Colonoscopy Indications:           Rectal bleeding Providers:             Jaynie Collins DO, DO Medicines:             Monitored Anesthesia Care Complications:         No immediate complications. Estimated blood loss:                         Minimal. Procedure:             Pre-Anesthesia Assessment:                        - Prior to the procedure, a History and Physical was                         performed, and patient medications and allergies were                         reviewed. The patient is competent. The risks and                         benefits of the procedure and the sedation options and                         risks were discussed with the patient. All questions                         were answered and informed consent was obtained.                         Patient identification and proposed procedure were                         verified by the physician, the nurse, the anesthetist                         and the technician in the endoscopy suite. Mental                         Status Examination: alert and oriented. Airway                         Examination: normal oropharyngeal airway and neck                         mobility. Respiratory Examination: clear to                         auscultation. CV Examination: RRR, no murmurs, no S3                         or S4. Prophylactic Antibiotics: The patient does not  require prophylactic antibiotics. Prior                         Anticoagulants: The patient has taken no anticoagulant                         or antiplatelet agents. ASA Grade Assessment:  III - A                         patient with severe systemic disease. After reviewing                         the risks and benefits, the patient was deemed in                         satisfactory condition to undergo the procedure. The                         anesthesia plan was to use monitored anesthesia care                         (MAC). Immediately prior to administration of                         medications, the patient was re-assessed for adequacy                         to receive sedatives. The heart rate, respiratory                         rate, oxygen saturations, blood pressure, adequacy of                         pulmonary ventilation, and response to care were                         monitored throughout the procedure. The physical                         status of the patient was re-assessed after the                         procedure.                        After obtaining informed consent, the colonoscope was                         passed under direct vision. Throughout the procedure,                         the patient's blood pressure, pulse, and oxygen                         saturations were monitored continuously. The                         Colonoscope was introduced through the anus and  advanced to the the cecum, identified by appendiceal                         orifice and ileocecal valve. The colonoscopy was                         performed without difficulty. The patient tolerated                         the procedure well. The quality of the bowel                         preparation was evaluated using the BBPS [facility] Bowel                         Preparation Scale) with scores of: Right Colon = 3,                         Transverse Colon = 3 and Left Colon = 3 (entire mucosa                         seen well with no residual staining, small fragments                         of stool or opaque liquid). The total BBPS score                          equals 9. The ileocecal valve, appendiceal orifice,                         and rectum were photographed. Findings:      The perianal and digital rectal examinations were normal. Pertinent       negatives include normal sphincter tone.      Multiple small-mouthed diverticula were found in the entire colon.       Severe disease Estimated blood loss: none.      A single (solitary) nine mm ulcer was found in the ascending colon. No       bleeding was present. Stigmata of recent bleeding were present.       Suspicious for diverticula- this area was ulcerated with old blood/stool       over it which was likely the source for her recent LGIB episode.       Biopsies were taken with a cold forceps for histology. Area was tattooed       with an injection of 2 mL of [place] ink. Tattoo placed two folds proximal       to this area. Estimated blood loss was minimal.      The exam was otherwise without abnormality on direct and retroflexion       views. Impression:            - Diverticulosis in the entire examined colon.                        - A single (solitary) ulcer in the ascending colon.                         Biopsied. Tattooed.                        -  The examination was otherwise normal on direct and                         retroflexion views. Recommendation:        - Patient has a contact number available for                         emergencies. The signs and symptoms of potential                         delayed complications were discussed with the patient.                         Return to normal activities tomorrow. Written                         discharge instructions were provided to the patient.                        - Discharge patient to home.                        - Resume previous diet.                        - Continue present medications.                        - Await pathology results.                        - Repeat colonoscopy 6-8 weeks to review site  post                         healing.                        - No ibuprofen, naproxen, or other non-steroidal                         anti-inflammatory drugs.                        - Miralax 1 capful (17 grams) in 8 ounces of water PO                         daily.                        - Return to GI office as previously scheduled.                        - The findings and recommendations were discussed with                         the patient. Procedure Code(s):     --- Professional ---                        [phone], Colonoscopy, flexible; with biopsy, single or  multiple                        [id number], Colonoscopy, flexible; with directed submucosal                         injection(s), any substance Diagnosis Code(s):     --- Professional ---                        K63.3, Ulcer of intestine                        K92.1, Melena (includes Hematochezia)                        K57.30, Diverticulosis of large intestine without                         perforation or abscess without bleeding CPT copyright 2022 American Medical Association. All rights reserved. The codes documented in this report are preliminary and upon coder review may  be revised to meet current compliance requirements. Attending Participation:      I personally performed the entire procedure. Polo Brisk, DO Quintin Buckle DO, DO 10/30/2023 1:36:01 PM This report has been signed electronically. Number of Addenda: 0 Note Initiated On: 10/30/2023 11:57 AM Scope Withdrawal Time: 0 hours 17 minutes 57 seconds  Total Procedure Duration: 0 hours 28 minutes 0 seconds  Estimated Blood Loss:  Estimated blood loss was minimal.      [facility]

## 2023-10-30 NOTE — Transfer of Care (Signed)
 Immediate Anesthesia Transfer of Care Note  Patient: Jackie Hunter  Procedure(s) Performed: COLONOSCOPY INJECTION, SUBMUCOSAL  Patient Location: PACU  Anesthesia Type:General  Level of Consciousness: sedated  Airway & Oxygen Therapy: Patient Spontanous Breathing  Post-op Assessment: Report given to RN and Post -op Vital signs reviewed and stable  Post vital signs: Reviewed and stable  Last Vitals:  Vitals Value Taken Time  BP    Temp    Pulse 72 10/30/23 1334  Resp 12 10/30/23 1334  SpO2 100 % 10/30/23 1334  Vitals shown include unfiled device data.  Last Pain:  Vitals:   10/30/23 1151  TempSrc: Temporal  PainSc: 0-No pain         Complications: No notable events documented.

## 2023-10-30 NOTE — Anesthesia Preprocedure Evaluation (Signed)
 Anesthesia Evaluation  Patient identified by MRN, date of birth, ID band Patient awake    Reviewed: Allergy & Precautions, H&P , NPO status , Patient's Chart, lab work & pertinent test results, reviewed documented beta blocker date and time   History of Anesthesia Complications Negative for: history of anesthetic complications  Airway Mallampati: III   Neck ROM: full    Dental  (+) Poor Dentition, Teeth Intact   Pulmonary neg pulmonary ROS, neg shortness of breath, neg COPD, neg recent URI   Pulmonary exam normal        Cardiovascular Exercise Tolerance: Good hypertension, (-) angina (-) CAD, (-) Past MI, (-) Cardiac Stents and (-) CABG Normal cardiovascular exam(-) dysrhythmias (-) Valvular Problems/Murmurs     Neuro/Psych  Headaches, neg Seizures PSYCHIATRIC DISORDERS Anxiety Depression    TIA Neuromuscular disease CVA, No Residual Symptoms    GI/Hepatic Neg liver ROS, hiatal hernia,GERD  Controlled,,  Endo/Other  diabetes  Diet controlled  Renal/GU      Musculoskeletal   Abdominal   Peds  Hematology  (+) Blood dyscrasia   Anesthesia Other Findings Blood glucose is 49 this morning. Will give 1 amp of D50.  Past Medical History: No date: Adenoma of colon No date: Allergic rhinitis No date: Anxiety No date: Arthritis No date: Asthma No date: Back pain No date: Carpal tunnel syndrome No date: Cervicalgia No date: Chronic kidney disease     Comment: STAGE 3 No date: DDD (degenerative disc disease), lumbar No date: GERD (gastroesophageal reflux disease) No date: Headache No date: History of hiatal hernia No date: Hypertension No date: Lipids serum increased No date: Lumbago No date: Migraines No date: Obesity No date: Osteoarthritis No date: Stroke [facility]) No date: TIA (transient ischemic attack) No date: Wrist fracture Past Surgical History: 2014: BREAST BIOPSY Right     Comment: NEG 1967: BREAST  EXCISIONAL BIOPSY Left     Comment: NEG No date: cataracts Bilateral 05/03/2016: COLONOSCOPY WITH PROPOFOL N/A     Comment: Procedure: COLONOSCOPY WITH PROPOFOL;                Surgeon: Deveron Fly, MD;  Location: [facility]              ENDOSCOPY;  Service: Endoscopy;  Laterality:               N/A; 10/26/2015: ESOPHAGOGASTRODUODENOSCOPY (EGD) WITH PROPOFOL N/A     Comment: Procedure: ESOPHAGOGASTRODUODENOSCOPY (EGD)               WITH PROPOFOL;  Surgeon: Stephens Eis, MD;                Location: [facility] ENDOSCOPY;  Service:               Gastroenterology;  Laterality: N/A; No date: EYE SURGERY No date: OOPHORECTOMY     Comment: left   Reproductive/Obstetrics negative OB ROS                             Anesthesia Physical Anesthesia Plan  ASA: 3  Anesthesia Plan: General   Post-op Pain Management: Minimal or no pain anticipated   Induction: Intravenous  PONV Risk Score and Plan: 3 and Propofol infusion, TIVA and Ondansetron  Airway Management Planned: Nasal Cannula  Additional Equipment: None  Intra-op Plan:   Post-operative Plan:   Informed Consent: I have reviewed the patients History and Physical, chart, labs and discussed the procedure including the  risks, benefits and alternatives for the proposed anesthesia with the patient or authorized representative who has indicated his/her understanding and acceptance.     Dental advisory given  Plan Discussed with: CRNA and Surgeon  Anesthesia Plan Comments: (Discussed risks of anesthesia with patient, including possibility of difficulty with spontaneous ventilation under anesthesia necessitating airway intervention, PONV, and rare risks such as cardiac or respiratory or neurological events, and allergic reactions. Discussed the role of CRNA in patient's perioperative care. Patient understands.)       Anesthesia Quick Evaluation

## 2023-10-31 ENCOUNTER — Encounter: Payer: Self-pay | Admitting: Gastroenterology

## 2023-11-01 LAB — SURGICAL PATHOLOGY

## 2023-12-07 ENCOUNTER — Other Ambulatory Visit: Payer: Self-pay | Admitting: Internal Medicine

## 2023-12-07 DIAGNOSIS — R1010 Upper abdominal pain, unspecified: Secondary | ICD-10-CM

## 2023-12-07 DIAGNOSIS — R112 Nausea with vomiting, unspecified: Secondary | ICD-10-CM

## 2023-12-14 ENCOUNTER — Ambulatory Visit
Admission: RE | Admit: 2023-12-14 | Discharge: 2023-12-14 | Disposition: A | Discharge status: Home / Self Care | Source: Ambulatory Visit | Attending: Internal Medicine | Admitting: Internal Medicine

## 2023-12-14 DIAGNOSIS — R112 Nausea with vomiting, unspecified: Secondary | ICD-10-CM | POA: Insufficient documentation

## 2023-12-14 DIAGNOSIS — R1010 Upper abdominal pain, unspecified: Secondary | ICD-10-CM | POA: Diagnosis present

## 2023-12-21 ENCOUNTER — Encounter: Payer: Self-pay | Admitting: Gastroenterology

## 2024-01-08 ENCOUNTER — Encounter
Admission: RE | Disposition: A | Discharge status: Home / Self Care | Payer: Self-pay | Source: Ambulatory Visit | Attending: Gastroenterology

## 2024-01-08 ENCOUNTER — Ambulatory Visit: Admitting: Anesthesiology

## 2024-01-08 ENCOUNTER — Encounter: Payer: Self-pay | Admitting: Gastroenterology

## 2024-01-08 ENCOUNTER — Ambulatory Visit
Admission: RE | Admit: 2024-01-08 | Discharge: 2024-01-08 | Disposition: A | Discharge status: Home / Self Care | Source: Ambulatory Visit | Attending: Gastroenterology | Admitting: Gastroenterology

## 2024-01-08 DIAGNOSIS — K2960 Other gastritis without bleeding: Secondary | ICD-10-CM | POA: Insufficient documentation

## 2024-01-08 DIAGNOSIS — K6389 Other specified diseases of intestine: Secondary | ICD-10-CM | POA: Insufficient documentation

## 2024-01-08 DIAGNOSIS — K640 First degree hemorrhoids: Secondary | ICD-10-CM | POA: Insufficient documentation

## 2024-01-08 DIAGNOSIS — K625 Hemorrhage of anus and rectum: Secondary | ICD-10-CM | POA: Diagnosis present

## 2024-01-08 DIAGNOSIS — R110 Nausea: Secondary | ICD-10-CM | POA: Diagnosis not present

## 2024-01-08 DIAGNOSIS — N189 Chronic kidney disease, unspecified: Secondary | ICD-10-CM | POA: Diagnosis not present

## 2024-01-08 DIAGNOSIS — D120 Benign neoplasm of cecum: Secondary | ICD-10-CM | POA: Insufficient documentation

## 2024-01-08 DIAGNOSIS — I129 Hypertensive chronic kidney disease with stage 1 through stage 4 chronic kidney disease, or unspecified chronic kidney disease: Secondary | ICD-10-CM | POA: Insufficient documentation

## 2024-01-08 DIAGNOSIS — K2289 Other specified disease of esophagus: Secondary | ICD-10-CM | POA: Insufficient documentation

## 2024-01-08 DIAGNOSIS — Z79899 Other long term (current) drug therapy: Secondary | ICD-10-CM | POA: Insufficient documentation

## 2024-01-08 DIAGNOSIS — E1122 Type 2 diabetes mellitus with diabetic chronic kidney disease: Secondary | ICD-10-CM | POA: Insufficient documentation

## 2024-01-08 DIAGNOSIS — K5730 Diverticulosis of large intestine without perforation or abscess without bleeding: Secondary | ICD-10-CM | POA: Diagnosis not present

## 2024-01-08 DIAGNOSIS — K2100 Gastro-esophageal reflux disease with esophagitis, without bleeding: Secondary | ICD-10-CM | POA: Insufficient documentation

## 2024-01-08 HISTORY — PX: POLYPECTOMY: SHX149

## 2024-01-08 HISTORY — PX: COLONOSCOPY: SHX5424

## 2024-01-08 HISTORY — PX: ESOPHAGOGASTRODUODENOSCOPY: SHX5428

## 2024-01-08 LAB — GLUCOSE, CAPILLARY
Glucose-Capillary: 64 mg/dL — ABNORMAL LOW (ref 70–99)
Glucose-Capillary: 82 mg/dL (ref 70–99)

## 2024-01-08 SURGERY — COLONOSCOPY
Anesthesia: General

## 2024-01-08 MED ORDER — LIDOCAINE HCL (PF) 2 % IJ SOLN
INTRAMUSCULAR | Status: AC
Start: 2024-01-08 — End: 2024-01-08
  Filled 2024-01-08: qty 5

## 2024-01-08 MED ORDER — PROPOFOL 1000 MG/100ML IV EMUL
INTRAVENOUS | Status: AC
Start: 2024-01-08 — End: 2024-01-08
  Filled 2024-01-08: qty 100

## 2024-01-08 MED ORDER — PROPOFOL 10 MG/ML IV BOLUS
INTRAVENOUS | Status: DC | PRN
  Administered 2024-01-08: 30 mg via INTRAVENOUS
  Administered 2024-01-08: 20 mg via INTRAVENOUS

## 2024-01-08 MED ORDER — SODIUM CHLORIDE 0.9 % IV SOLN
INTRAVENOUS | Status: DC
  Administered 2024-01-08: 1000 mL via INTRAVENOUS

## 2024-01-08 MED ORDER — DEXMEDETOMIDINE HCL IN NACL 80 MCG/20ML IV SOLN
INTRAVENOUS | Status: DC | PRN
  Administered 2024-01-08 (×2): 8 ug via INTRAVENOUS

## 2024-01-08 MED ORDER — GLYCOPYRROLATE 0.2 MG/ML IJ SOLN
INTRAMUSCULAR | Status: DC | PRN
  Administered 2024-01-08: .2 mg via INTRAVENOUS

## 2024-01-08 MED ORDER — GLYCOPYRROLATE 0.2 MG/ML IJ SOLN
INTRAMUSCULAR | Status: AC
  Filled 2024-01-08: qty 1

## 2024-01-08 MED ORDER — LIDOCAINE HCL (CARDIAC) PF 100 MG/5ML IV SOSY
PREFILLED_SYRINGE | INTRAVENOUS | Status: DC | PRN
  Administered 2024-01-08: 60 mg via INTRAVENOUS

## 2024-01-08 MED ORDER — DEXMEDETOMIDINE HCL IN NACL 80 MCG/20ML IV SOLN
INTRAVENOUS | Status: AC
Start: 2024-01-08 — End: 2024-01-08
  Filled 2024-01-08: qty 20

## 2024-01-08 MED ORDER — PROPOFOL 500 MG/50ML IV EMUL
INTRAVENOUS | Status: DC | PRN
  Administered 2024-01-08: 75 ug/kg/min via INTRAVENOUS

## 2024-01-08 NOTE — Op Note (Signed)
 [facility] Gastroenterology Patient Name: Jackie Hunter Procedure Date: 01/08/2024 7:14 AM MRN: [id number] Account #: [id number] Date of Birth: 02-05-1946 Admit Type: Outpatient Age: 78 Room: [facility] ENDO ROOM 1 Gender: Female Note Status: Finalized Instrument Name: Peds Colonoscope [id number] Procedure:             Colonoscopy Indications:           Bright red blood per rectum, nausea, colon                         ulcer/abnormal colonoscopy Providers:             Elspeth Ozell Jungling DO, DO Referring MD:          Tamra Leventhal, MD (Referring MD) Medicines:             Monitored Anesthesia Care Complications:         No immediate complications. Estimated blood loss:                         Minimal. Procedure:             Pre-Anesthesia Assessment:                        - Prior to the procedure, a History and Physical was                         performed, and patient medications and allergies were                         reviewed. The patient is competent. The risks and                         benefits of the procedure and the sedation options and                         risks were discussed with the patient. All questions                         were answered and informed consent was obtained.                         Patient identification and proposed procedure were                         verified by the physician, the nurse, the anesthetist                         and the technician in the endoscopy suite. Mental                         Status Examination: alert and oriented. Airway                         Examination: normal oropharyngeal airway and neck                         mobility. Respiratory Examination: clear to  auscultation. CV Examination: RRR, no murmurs, no S3                         or S4. Prophylactic Antibiotics: The patient does not                         require prophylactic antibiotics. Prior                          Anticoagulants: The patient has taken no anticoagulant                         or antiplatelet agents. ASA Grade Assessment: II - A                         patient with mild systemic disease. After reviewing                         the risks and benefits, the patient was deemed in                         satisfactory condition to undergo the procedure. The                         anesthesia plan was to use monitored anesthesia care                         (MAC). Immediately prior to administration of                         medications, the patient was re-assessed for adequacy                         to receive sedatives. The heart rate, respiratory                         rate, oxygen saturations, blood pressure, adequacy of                         pulmonary ventilation, and response to care were                         monitored throughout the procedure. The physical                         status of the patient was re-assessed after the                         procedure.                        After obtaining informed consent, the colonoscope was                         passed under direct vision. Throughout the procedure,                         the patient's blood pressure, pulse, and oxygen  saturations were monitored continuously. The                         Colonoscope was introduced through the anus and                         advanced to the the cecum, identified by appendiceal                         orifice and ileocecal valve. The colonoscopy was                         performed without difficulty. The patient tolerated                         the procedure well. The quality of the bowel                         preparation was evaluated using the BBPS [facility] Bowel                         Preparation Scale) with scores of: Right Colon = 3                         (entire mucosa seen well with no residual staining,                         small fragments  of stool or opaque liquid), Transverse                         Colon = 2 (minor amount of residual staining, small                         fragments of stool and/or opaque liquid, but mucosa                         seen well) and Left Colon = 3 (entire mucosa seen well                         with no residual staining, small fragments of stool or                         opaque liquid). The total BBPS score equals 8. The                         quality of the bowel preparation was excellent. The                         ileocecal valve, appendiceal orifice, and rectum were                         photographed. Findings:      The perianal and digital rectal examinations were normal. Pertinent       negatives include normal sphincter tone.      Retroflexion in the right colon was performed.      A tattoo was seen in the ascending colon. The tattoo site appeared  normal. No signs of previous colon ulcer.      A 1 to 2 mm polyp was found in the cecum. The polyp was sessile. The       polyp was removed with a jumbo cold forceps. Resection and retrieval       were complete. Estimated blood loss was minimal.      A diffuse area of mild melanosis was found in the ascending colon.       Estimated blood loss: none.      Multiple small-mouthed diverticula were found in the entire colon.       Estimated blood loss: none.      Non-bleeding internal hemorrhoids were found during retroflexion. The       hemorrhoids were Grade I (internal hemorrhoids that do not prolapse).       Estimated blood loss: none.      The exam was otherwise without abnormality on direct and retroflexion       views. Impression:            - A tattoo was seen in the ascending colon. The tattoo                         site appeared normal.                        - One 1 to 2 mm polyp in the cecum, removed with a                         jumbo cold forceps. Resected and retrieved.                        - Melanosis in the  colon.                        - Diverticulosis in the entire examined colon.                        - Non-bleeding internal hemorrhoids.                        - The examination was otherwise normal on direct and                         retroflexion views. Recommendation:        - Patient has a contact number available for                         emergencies. The signs and symptoms of potential                         delayed complications were discussed with the patient.                         Return to normal activities tomorrow. Written                         discharge instructions were provided to the patient.                        - Discharge patient to home.                        -  Resume previous diet.                        - Continue present medications.                        - Await pathology results.                        - Repeat colonoscopy for surveillance based on                         pathology results.                        - Return to referring physician as previously                         scheduled.                        - The findings and recommendations were discussed with                         the patient. Procedure Code(s):     --- Professional ---                        [phone], Colonoscopy, flexible; with biopsy, single or                         multiple Diagnosis Code(s):     --- Professional ---                        K64.0, First degree hemorrhoids                        D12.0, Benign neoplasm of cecum                        K63.89, Other specified diseases of intestine                        K57.30, Diverticulosis of large intestine without                         perforation or abscess without bleeding CPT copyright 2022 American [employer] Association. All rights reserved. The codes documented in this report are preliminary and upon coder review may  be revised to meet current compliance requirements. Attending Participation:      I personally  performed the entire procedure. Elspeth Jungling, DO Elspeth Ozell Jungling DO, DO 01/08/2024 8:04:52 AM This report has been signed electronically. Number of Addenda: 0 Note Initiated On: 01/08/2024 7:14 AM Scope Withdrawal Time: 0 hours 8 minutes 20 seconds  Total Procedure Duration: 0 hours 11 minutes 33 seconds  Estimated Blood Loss:  Estimated blood loss was minimal.      [facility]

## 2024-01-08 NOTE — Anesthesia Postprocedure Evaluation (Signed)
 Anesthesia Post Note  Patient: Jackie Hunter  Procedure(s) Performed: COLONOSCOPY EGD (ESOPHAGOGASTRODUODENOSCOPY) POLYPECTOMY, INTESTINE  Patient location during evaluation: Endoscopy Anesthesia Type: General Level of consciousness: awake and alert Pain management: pain level controlled Vital Signs Assessment: post-procedure vital signs reviewed and stable Respiratory status: spontaneous breathing, nonlabored ventilation, respiratory function stable and patient connected to nasal cannula oxygen Cardiovascular status: blood pressure returned to baseline and stable Postop Assessment: no apparent nausea or vomiting Anesthetic complications: no   No notable events documented.   Last Vitals:  Vitals:   01/08/24 0814 01/08/24 0824  BP: 110/68 109/75  Pulse: 70 69  Resp: 12 17  Temp:    SpO2: 97% 97%    Last Pain:  Vitals:   01/08/24 0824  TempSrc:   PainSc: 0-No pain                 Rome Ade

## 2024-01-08 NOTE — Anesthesia Preprocedure Evaluation (Signed)
 Anesthesia Evaluation  Patient identified by MRN, date of birth, ID band Patient awake    Reviewed: Allergy & Precautions, H&P , NPO status , Patient's Chart, lab work & pertinent test results, reviewed documented beta blocker date and time   History of Anesthesia Complications Negative for: history of anesthetic complications  Airway Mallampati: III  TM Distance: >3 FB Neck ROM: full    Dental no notable dental hx. (+) Teeth Intact   Pulmonary neg pulmonary ROS, neg shortness of breath, neg COPD, neg recent URI   Pulmonary exam normal breath sounds clear to auscultation       Cardiovascular Exercise Tolerance: Good hypertension, Pt. on medications (-) angina (-) CAD, (-) Past MI, (-) Cardiac Stents and (-) CABG Normal cardiovascular exam(-) dysrhythmias (-) Valvular Problems/Murmurs Rhythm:Regular Rate:Normal - Systolic murmurs    Neuro/Psych  Headaches, neg Seizures PSYCHIATRIC DISORDERS Anxiety Depression    TIA Neuromuscular disease CVA, No Residual Symptoms    GI/Hepatic Neg liver ROS, hiatal hernia,GERD  Controlled,,  Endo/Other  diabetes  Diet controlled  Renal/GU      Musculoskeletal   Abdominal   Peds  Hematology  (+) Blood dyscrasia   Anesthesia Other Findings   Past Medical History: No date: Adenoma of colon No date: Allergic rhinitis No date: Anxiety No date: Arthritis No date: Asthma No date: Back pain No date: Carpal tunnel syndrome No date: Cervicalgia No date: Chronic kidney disease     Comment: STAGE 3 No date: DDD (degenerative disc disease), lumbar No date: GERD (gastroesophageal reflux disease) No date: Headache No date: History of hiatal hernia No date: Hypertension No date: Lipids serum increased No date: Lumbago No date: Migraines No date: Obesity No date: Osteoarthritis No date: Stroke [facility]) No date: TIA (transient ischemic attack) No date: Wrist fracture Past Surgical  History: 2014: BREAST BIOPSY Right     Comment: NEG 1967: BREAST EXCISIONAL BIOPSY Left     Comment: NEG No date: cataracts Bilateral 05/03/2016: COLONOSCOPY WITH PROPOFOL  N/A     Comment: Procedure: COLONOSCOPY WITH PROPOFOL ;                Surgeon: Gladis RAYMOND Mariner, MD;  Location: [facility]              ENDOSCOPY;  Service: Endoscopy;  Laterality:               N/A; 10/26/2015: ESOPHAGOGASTRODUODENOSCOPY (EGD) WITH PROPOFOL  N/A     Comment: Procedure: ESOPHAGOGASTRODUODENOSCOPY (EGD)               WITH PROPOFOL ;  Surgeon: Deward CINDERELLA Piedmont, MD;                Location: [facility] ENDOSCOPY;  Service:               Gastroenterology;  Laterality: N/A; No date: EYE SURGERY No date: OOPHORECTOMY     Comment: left   Reproductive/Obstetrics negative OB ROS                             Anesthesia Physical Anesthesia Plan  ASA: 3  Anesthesia Plan: General   Post-op Pain Management: Minimal or no pain anticipated   Induction: Intravenous  PONV Risk Score and Plan: 3 and Propofol  infusion, TIVA and Ondansetron   Airway Management Planned: Nasal Cannula  Additional Equipment: None  Intra-op Plan:   Post-operative Plan:   Informed Consent: I have reviewed the patients History and Physical, chart, labs and discussed  the procedure including the risks, benefits and alternatives for the proposed anesthesia with the patient or authorized representative who has indicated his/her understanding and acceptance.     Dental advisory given  Plan Discussed with: CRNA and Surgeon  Anesthesia Plan Comments: (Discussed risks of anesthesia with patient, including possibility of difficulty with spontaneous ventilation under anesthesia necessitating airway intervention, PONV, and rare risks such as cardiac or respiratory or neurological events, and allergic reactions. Discussed the role of CRNA in patient's perioperative care. Patient understands.)       Anesthesia Quick  Evaluation

## 2024-01-08 NOTE — Op Note (Signed)
 [facility] Gastroenterology Patient Name: Jackie Hunter Procedure Date: 01/08/2024 7:25 AM MRN: [id number] Account #: [id number] Date of Birth: 12/05/1945 Admit Type: Outpatient Age: 78 Room: [facility] ENDO ROOM 1 Gender: Female Note Status: Finalized Instrument Name: Upper Endoscope [id number] Procedure:             Upper GI endoscopy Indications:           Gastro-esophageal reflux disease, Nausea Providers:             Elspeth Ozell Jungling DO, DO Referring MD:          Tamra Leventhal, MD (Referring MD) Medicines:             Monitored Anesthesia Care Complications:         No immediate complications. Estimated blood loss:                         Minimal. Procedure:             Pre-Anesthesia Assessment:                        - Prior to the procedure, a History and Physical was                         performed, and patient medications and allergies were                         reviewed. The patient is competent. The risks and                         benefits of the procedure and the sedation options and                         risks were discussed with the patient. All questions                         were answered and informed consent was obtained.                         Patient identification and proposed procedure were                         verified by the physician, the nurse, the anesthetist                         and the technician in the endoscopy suite. Mental                         Status Examination: alert and oriented. Airway                         Examination: normal oropharyngeal airway and neck                         mobility. Respiratory Examination: clear to                         auscultation. CV Examination: RRR, no murmurs, no S3  or S4. Prophylactic Antibiotics: The patient does not                         require prophylactic antibiotics. Prior                         Anticoagulants: The patient has taken no  anticoagulant                         or antiplatelet agents. ASA Grade Assessment: II - A                         patient with mild systemic disease. After reviewing                         the risks and benefits, the patient was deemed in                         satisfactory condition to undergo the procedure. The                         anesthesia plan was to use monitored anesthesia care                         (MAC). Immediately prior to administration of                         medications, the patient was re-assessed for adequacy                         to receive sedatives. The heart rate, respiratory                         rate, oxygen saturations, blood pressure, adequacy of                         pulmonary ventilation, and response to care were                         monitored throughout the procedure. The physical                         status of the patient was re-assessed after the                         procedure.                        After obtaining informed consent, the endoscope was                         passed under direct vision. Throughout the procedure,                         the patient's blood pressure, pulse, and oxygen                         saturations were monitored continuously. The Endoscope  was introduced through the mouth, and advanced to the                         third part of duodenum. The upper GI endoscopy was                         accomplished without difficulty. The patient tolerated                         the procedure well. Findings:      The duodenal bulb, first portion of the duodenum, second portion of the       duodenum and third portion of the duodenum were normal. Estimated blood       loss: none.      The entire examined stomach was normal. Biopsies were taken with a cold       forceps for Helicobacter pylori testing. Estimated blood loss was       minimal.      The gastroesophageal flap valve was  visualized endoscopically and       classified as Hill Grade II (fold present, opens with respiration).       Estimated blood loss: none.      The Z-line was irregular. Biopsies were taken with a cold forceps for       histology. Biopsies in 4 quadrant fashion in one bottle. ~2 cm in length       Estimated blood loss was minimal.      Esophagogastric landmarks were identified: the gastroesophageal junction       was found at 35 cm from the incisors.      The exam of the esophagus was otherwise normal. Impression:            - Normal duodenal bulb, first portion of the duodenum,                         second portion of the duodenum and third portion of                         the duodenum.                        - Normal stomach. Biopsied.                        - Gastroesophageal flap valve classified as Hill Grade                         II (fold present, opens with respiration).                        - Z-line irregular. Biopsied.                        - Esophagogastric landmarks identified. Recommendation:        - Patient has a contact number available for                         emergencies. The signs and symptoms of potential  delayed complications were discussed with the patient.                         Return to normal activities tomorrow. Written                         discharge instructions were provided to the patient.                        - Discharge patient to home.                        - Resume previous diet.                        - Continue present medications.                        - Await pathology results.                        - Repeat upper endoscopy for surveillance based on                         pathology results.                        - Return to referring physician as previously                         scheduled.                        - The findings and recommendations were discussed with                         the  patient. Procedure Code(s):     --- Professional ---                        [phone], Esophagogastroduodenoscopy, flexible,                         transoral; with biopsy, single or multiple Diagnosis Code(s):     --- Professional ---                        K22.89, Other specified disease of esophagus                        K21.9, Gastro-esophageal reflux disease without                         esophagitis                        R11.0, Nausea CPT copyright 2022 American Medical Association. All rights reserved. The codes documented in this report are preliminary and upon coder review may  be revised to meet current compliance requirements. Attending Participation:      I personally performed the entire procedure. Elspeth Jungling, DO Elspeth Ozell Jungling DO, DO 01/08/2024 7:45:13 AM This report has been signed electronically. Number of Addenda: 0 Note Initiated On: 01/08/2024 7:25 AM Estimated Blood  Loss:  Estimated blood loss was minimal.      [facility]

## 2024-01-08 NOTE — H&P (Signed)
 Pre-Procedure H&P   Patient ID: Jackie Hunter is a 78 y.o. female.  Gastroenterology Provider: Elspeth Ozell Jungling, DO  Referring Provider: Romero Antigua, PA PCP: Sadie Manna, MD  Date: 01/08/2024  HPI Jackie Hunter is a 78 y.o. female who presents today for Esophagogastroduodenoscopy and Colonoscopy for Nausea, bright red blood per rectum, colonic ulcer .  Patient with worsening heartburn regurgitation and bloating.  She experiences nausea.  Last EGD performed in 2020 demonstrated duodenitis and reflux.  Negative for H. pylori.  Repeat breath testing was negative for H. pylori as well.  She is currently on Protonix   Underwent colonoscopy in April 2025 demonstrating a 9 mm solitary ulcer in the ascending colon.  Biopsies were overall unrevealing.  A tattoo was placed by this area for relook.  Pandiverticulosis also noted. Patient has no longer had any bright red blood per rectum since her colonoscopy.  Constipation has been resolving with a tea that the patient drinks.  Not taking any medications for constipation.   Past Medical History:  Diagnosis Date   Adenoma of colon    Allergic rhinitis    Anemia    Anxiety    Arthritis    Asthma    Back pain    Carpal tunnel syndrome    Carpal tunnel syndrome    Carpal tunnel syndrome    Cataract cortical, senile    Cervicalgia    Chronic kidney disease    STAGE 3   CKD (chronic kidney disease)    DDD (degenerative disc disease), lumbar    Diabetes mellitus without complication (HCC)    does not take any medications   Diverticulosis    Gastritis    GERD (gastroesophageal reflux disease)    Headache    History of hiatal hernia    Hypertension    Lipids serum increased    Lumbago    Migraines    Obesity    Osteoarthritis    Stroke [facility])    TIA (transient ischemic attack)    Wrist fracture     Past Surgical History:  Procedure Laterality Date   BREAST BIOPSY Right 2014   NEG   BREAST BIOPSY Left  11/24/2016   us  core, radial scar    BREAST EXCISIONAL BIOPSY Left 1967   NEG   cataracts Bilateral    COLONOSCOPY N/A 10/30/2023   Procedure: COLONOSCOPY;  Surgeon: Jungling Elspeth Ozell, DO;  Location: [facility] ENDOSCOPY;  Service: Gastroenterology;  Laterality: N/A;   COLONOSCOPY WITH PROPOFOL  N/A 05/03/2016   Procedure: COLONOSCOPY WITH PROPOFOL ;  Surgeon: Gladis RAYMOND Mariner, MD;  Location: [facility] ENDOSCOPY;  Service: Endoscopy;  Laterality: N/A;   COLONOSCOPY WITH PROPOFOL  N/A 11/01/2016   Procedure: COLONOSCOPY WITH PROPOFOL ;  Surgeon: Gladis RAYMOND Mariner, MD;  Location: [facility] ENDOSCOPY;  Service: Endoscopy;  Laterality: N/A;   COLONOSCOPY WITH PROPOFOL  N/A 11/02/2016   Procedure: COLONOSCOPY WITH PROPOFOL ;  Surgeon: Gladis RAYMOND Mariner, MD;  Location: [facility] ENDOSCOPY;  Service: Endoscopy;  Laterality: N/A;   COLONOSCOPY WITH PROPOFOL  N/A 12/01/2017   Procedure: COLONOSCOPY WITH PROPOFOL ;  Surgeon: Mariner Gladis RAYMOND, MD;  Location: [facility] ENDOSCOPY;  Service: Endoscopy;  Laterality: N/A;   COLONOSCOPY WITH PROPOFOL  N/A 03/02/2018   Procedure: COLONOSCOPY WITH PROPOFOL ;  Surgeon: Mariner Gladis RAYMOND, MD;  Location: [facility] ENDOSCOPY;  Service: Endoscopy;  Laterality: N/A;   COLONOSCOPY WITH PROPOFOL  N/A 12/28/2020   Procedure: COLONOSCOPY WITH PROPOFOL ;  Surgeon: Unk Corinn Skiff, MD;  Location: [facility] ENDOSCOPY;  Service: Gastroenterology;  Laterality: N/A;  ESOPHAGOGASTRODUODENOSCOPY (EGD) WITH PROPOFOL  N/A 10/26/2015   Procedure: ESOPHAGOGASTRODUODENOSCOPY (EGD) WITH PROPOFOL ;  Surgeon: Deward CINDERELLA Piedmont, MD;  Location: [facility] ENDOSCOPY;  Service: Gastroenterology;  Laterality: N/A;   ESOPHAGOGASTRODUODENOSCOPY (EGD) WITH PROPOFOL  N/A 02/12/2019   Procedure: ESOPHAGOGASTRODUODENOSCOPY (EGD) WITH PROPOFOL ;  Surgeon: Gaylyn Gladis PENNER, MD;  Location: [facility] ENDOSCOPY;  Service: Endoscopy;  Laterality: N/A;   EYE SURGERY     cataract surgery   LIPOMA EXCISION Right 02/17/2022   Procedure: EXCISION LIPOMA;   Surgeon: Tye Millet, DO;  Location: [facility] ORS;  Service: General;  Laterality: Right;   OOPHORECTOMY     left   SUBMUCOSAL INJECTION  10/30/2023   Procedure: INJECTION, SUBMUCOSAL;  Surgeon: Onita Elspeth Sharper, DO;  Location: [facility] ENDOSCOPY;  Service: Gastroenterology;;    Family History No h/o GI disease or malignancy  Review of Systems  Constitutional:  Negative for activity change, appetite change, chills, diaphoresis, fatigue, fever and unexpected weight change.  HENT:  Negative for trouble swallowing and voice change.   Respiratory:  Negative for shortness of breath and wheezing.   Cardiovascular:  Negative for chest pain, palpitations and leg swelling.  Gastrointestinal:  Positive for constipation and nausea. Negative for abdominal distention, abdominal pain, anal bleeding, blood in stool, diarrhea, rectal pain and vomiting.  Musculoskeletal:  Negative for arthralgias and myalgias.  Skin:  Negative for color change and pallor.  Neurological:  Negative for dizziness, syncope and weakness.  Psychiatric/Behavioral:  Negative for confusion.   All other systems reviewed and are negative.    Medications No current facility-administered medications on file prior to encounter.   Current Outpatient Medications on File Prior to Encounter  Medication Sig Dispense Refill   ALPRAZolam  (XANAX ) 0.25 MG tablet Take 0.25 mg by mouth at bedtime as needed for anxiety.     ARIPiprazole (ABILIFY) 2 MG tablet Take 2 mg by mouth daily.     aspirin EC 81 MG tablet Take 81 mg by mouth as needed. Swallow whole.     Azelaic Acid  15 % gel Apply QAM to face, wash off at bedtime 50 g 2   BLACK ELDERBERRY PO Take by mouth as needed.     buPROPion  (WELLBUTRIN  XL) 300 MG 24 hr tablet Take 300 mg by mouth daily.     Cranberry-Vitamin C-Vitamin E (CRANBERRY PLUS VITAMIN C PO) Take 1 tablet by mouth as needed.     estradiol (ESTRACE) 0.1 MG/GM vaginal cream Place vaginally.     ferrous sulfate 325 (65 FE)  MG EC tablet Take 1 tablet by mouth every morning.     fexofenadine (ALLEGRA) 60 MG tablet Take 60 mg by mouth as needed.     finasteride  (PROSCAR ) 5 MG tablet Take 1 tablet (5 mg total) by mouth daily. 90 tablet 1   Lithium Orotate 5 MG CAPS Take by mouth as needed.     losartan (COZAAR) 50 MG tablet Take 50 mg by mouth daily.     minoxidil  (LONITEN ) 2.5 MG tablet Take 1 tablet (2.5 mg total) by mouth daily. 90 tablet 1   mirabegron  ER (MYRBETRIQ ) 50 MG TB24 tablet Take 1 tablet (50 mg total) by mouth daily. 30 tablet 11   Omega-3 1000 MG CAPS Take 1 capsule by mouth as needed.     pantoprazole  (PROTONIX ) 40 MG tablet Take 1 tablet (40 mg total) by mouth daily. 30 tablet 1   traZODone (DESYREL) 50 MG tablet Take 1 tablet by mouth at bedtime.     vitamin B-12 (CYANOCOBALAMIN ) 1000 MCG  tablet Take 1,000 mcg by mouth daily.     vitamin C (ASCORBIC ACID) 500 MG tablet Take 500 mg by mouth daily.     acetaminophen  (TYLENOL ) 325 MG tablet Take 2 tablets (650 mg total) by mouth every 6 (six) hours as needed for mild pain, fever or headache.      Pertinent medications related to GI and procedure were reviewed by me with the patient prior to the procedure   Current Facility-Administered Medications:    0.9 %  sodium chloride  infusion, , Intravenous, Continuous, Onita Elspeth Sharper, DO  sodium chloride          Allergies  Allergen Reactions   Levaquin [Levofloxacin In D5w] Other (See Comments)    dizziness   Levofloxacin     Other reaction(s): Dizziness dizziness   Meloxicam     Other reaction(s): Other (See Comments) GI upset   Codeine Anxiety and Other (See Comments)    Other reaction(s): Unknown (See Comments) Increased heart rate  Difficulty breathing; arrythmia   Sucralfate  Nausea Only   Allergies were reviewed by me prior to the procedure  Objective   Body mass index is 27.1 kg/m. Vitals:   01/08/24 0706  BP: (!) 152/73  Pulse: 66  Resp: 16  Temp: (!) 96.7 F (35.9  C)  TempSrc: Temporal  SpO2: 100%  Weight: 69.4 kg  Height: 5' 3 (1.6 m)     Physical Exam Vitals and nursing note reviewed.  Constitutional:      General: She is not in acute distress.    Appearance: Normal appearance. She is not ill-appearing, toxic-appearing or diaphoretic.  HENT:     Head: Normocephalic and atraumatic.     Nose: Nose normal.     Mouth/Throat:     Mouth: Mucous membranes are moist.     Pharynx: Oropharynx is clear.   Eyes:     General: No scleral icterus.    Extraocular Movements: Extraocular movements intact.    Cardiovascular:     Rate and Rhythm: Normal rate and regular rhythm.     Heart sounds: Normal heart sounds. No murmur heard.    No friction rub. No gallop.  Pulmonary:     Effort: Pulmonary effort is normal. No respiratory distress.     Breath sounds: Normal breath sounds. No wheezing, rhonchi or rales.  Abdominal:     General: Bowel sounds are normal. There is no distension.     Palpations: Abdomen is soft.     Tenderness: There is no abdominal tenderness. There is no guarding or rebound.   Musculoskeletal:     Cervical back: Neck supple.     Right lower leg: No edema.     Left lower leg: No edema.   Skin:    General: Skin is warm and dry.     Coloration: Skin is not jaundiced or pale.   Neurological:     General: No focal deficit present.     Mental Status: She is alert and oriented to person, place, and time. Mental status is at baseline.   Psychiatric:        Mood and Affect: Mood normal.        Behavior: Behavior normal.        Thought Content: Thought content normal.        Judgment: Judgment normal.      Assessment:  Jackie Hunter is a 78 y.o. female  who presents today for Esophagogastroduodenoscopy and Colonoscopy for Nausea, bright red blood per rectum,  colonic ulcer .  Plan:  Esophagogastroduodenoscopy and Colonoscopy with possible intervention today  Esophagogastroduodenoscopy and Colonoscopy with  possible biopsy, control of bleeding, polypectomy, and interventions as necessary has been discussed with the patient/patient representative. Informed consent was obtained from the patient/patient representative after explaining the indication, nature, and risks of the procedure including but not limited to death, bleeding, perforation, missed neoplasm/lesions, cardiorespiratory compromise, and reaction to medications. Opportunity for questions was given and appropriate answers were provided. Patient/patient representative has verbalized understanding is amenable to undergoing the procedure.   Elspeth Ozell Jungling, DO  [facility]  Portions of the record may have been created with voice recognition software. Occasional wrong-word or 'sound-a-like' substitutions may have occurred due to the inherent limitations of voice recognition software.  Read the chart carefully and recognize, using context, where substitutions may have occurred.

## 2024-01-08 NOTE — Transfer of Care (Signed)
 Immediate Anesthesia Transfer of Care Note  Patient: Jackie Hunter  Procedure(s) Performed: COLONOSCOPY EGD (ESOPHAGOGASTRODUODENOSCOPY) POLYPECTOMY, INTESTINE  Patient Location: PACU  Anesthesia Type:General  Level of Consciousness: sedated  Airway & Oxygen Therapy: Patient Spontanous Breathing  Post-op Assessment: Report given to RN and Post -op Vital signs reviewed and stable  Post vital signs: Reviewed and stable  Last Vitals:  Vitals Value Taken Time  BP 102/53 01/08/24 08:03  Temp    Pulse 70 01/08/24 08:03  Resp 17 01/08/24 08:03  SpO2 98 % 01/08/24 08:03  Vitals shown include unfiled device data.  Last Pain:  Vitals:   01/08/24 0706  TempSrc: Temporal  PainSc: 0-No pain         Complications: No notable events documented.

## 2024-01-08 NOTE — Progress Notes (Signed)
 Anesth. Aware of blood sugar. Patient with no symptoms

## 2024-01-08 NOTE — Interval H&P Note (Signed)
 History and Physical Interval Note: Preprocedure H&P from 01/08/24  was reviewed and there was no interval change after seeing and examining the patient.  Written consent was obtained from the patient after discussion of risks, benefits, and alternatives. Patient has consented to proceed with Esophagogastroduodenoscopy and Colonoscopy with possible intervention   01/08/2024 7:26 AM  Jackie Hunter  has presented today for surgery, with the diagnosis of Rectal bleeding (K62.5) Abnormal colonoscopy (R93.3) Nausea (R11.0) Gastroesophageal reflux disease, unspecified whether esophagitis present (K21.9).  The various methods of treatment have been discussed with the patient and family. After consideration of risks, benefits and other options for treatment, the patient has consented to  Procedure(s): COLONOSCOPY (N/A) EGD (ESOPHAGOGASTRODUODENOSCOPY) (N/A) as a surgical intervention.  The patient's history has been reviewed, patient examined, no change in status, stable for surgery.  I have reviewed the patient's chart and labs.  Questions were answered to the patient's satisfaction.     Elspeth Ozell Jungling

## 2024-01-09 LAB — SURGICAL PATHOLOGY

## 2024-03-04 ENCOUNTER — Ambulatory Visit: Admitting: Dermatology

## 2024-03-04 DIAGNOSIS — L299 Pruritus, unspecified: Secondary | ICD-10-CM | POA: Diagnosis not present

## 2024-03-04 DIAGNOSIS — L910 Hypertrophic scar: Secondary | ICD-10-CM | POA: Diagnosis not present

## 2024-03-04 MED ORDER — TRIAMCINOLONE ACETONIDE 40 MG/ML IJ SUSP
40.0000 mg | Freq: Once | INTRAMUSCULAR | Status: AC
  Administered 2024-03-04: 40 mg via INTRAMUSCULAR

## 2024-03-04 NOTE — Patient Instructions (Addendum)
Intralesional steroid injection side effects were reviewed including thinning of the skin and discoloration, such as redness, lightening or darkening.  Due to recent changes in healthcare laws, you may see results of your pathology and/or laboratory studies on MyChart before the doctors have had a chance to review them. We understand that in some cases there may be results that are confusing or concerning to you. Please understand that not all results are received at the same time and often the doctors may need to interpret multiple results in order to provide you with the best plan of care or course of treatment. Therefore, we ask that you please give [place] 2 business days to thoroughly review all your results before contacting the office for clarification. Should we see a critical lab result, you will be contacted sooner.   If You Need Anything After Your Visit  If you have any questions or concerns for your doctor, please call our main line at [phone] and press option 4 to reach your doctor's medical assistant. If no one answers, please leave a voicemail as directed and we will return your call as soon as possible. Messages left after 4 pm will be answered the following business day.   You may also send [place] a message via MyChart. We typically respond to MyChart messages within 1-2 business days.  For prescription refills, please ask your pharmacy to contact our office. Our fax number is [phone].  If you have an urgent issue when the clinic is closed that cannot wait until the next business day, you can page your doctor at the number below.    Please note that while we do our best to be available for urgent issues outside of office hours, we are not available 24/7.   If you have an urgent issue and are unable to reach [place], you may choose to seek medical care at your doctor's office, retail clinic, urgent care center, or emergency room.  If you have a medical emergency, please immediately  call 911 or go to the emergency department.  Pager Numbers  - Dr. Gwen Pounds: [phone]  - Dr. Roseanne Reno: [phone]  - Dr. Katrinka Blazing: [phone]   In the event of inclement weather, please call our main line at [phone] for an update on the status of any delays or closures.  Dermatology Medication Tips: Please keep the boxes that topical medications come in in order to help keep track of the instructions about where and how to use these. Pharmacies typically print the medication instructions only on the boxes and not directly on the medication tubes.   If your medication is too expensive, please contact our office at [phone] option 4 or send [place] a message through MyChart.   We are unable to tell what your co-pay for medications will be in advance as this is different depending on your insurance coverage. However, we may be able to find a substitute medication at lower cost or fill out paperwork to get insurance to cover a needed medication.   If a prior authorization is required to get your medication covered by your insurance company, please allow [place] 1-2 business days to complete this process.  Drug prices often vary depending on where the prescription is filled and some pharmacies may offer cheaper prices.  The website www.goodrx.com contains coupons for medications through different pharmacies. The prices here do not account for what the cost may be with help from insurance (it may be cheaper with your insurance), but the website can give  you the price if you did not use any insurance.  - You can print the associated coupon and take it with your prescription to the pharmacy.  - You may also stop by our office during regular business hours and pick up a GoodRx coupon card.  - If you need your prescription sent electronically to a different pharmacy, notify our office through [facility] or by phone at [phone] option 4.     Si Usted Necesita Algo Despus de Su  Visita  Tambin puede enviarnos un mensaje a travs de Clinical cytogeneticist. Por lo general respondemos a los mensajes de MyChart en el transcurso de 1 a 2 das hbiles.  Para renovar recetas, por favor pida a su farmacia que se ponga en contacto con nuestra oficina. Annie Sable de fax es [place] [phone].  Si tiene un asunto urgente cuando la clnica est cerrada y que no puede esperar hasta el siguiente da hbil, puede llamar/localizar a su doctor(a) al nmero que aparece a continuacin.   Por favor, tenga en cuenta que aunque hacemos todo lo posible para estar disponibles para asuntos urgentes fuera del horario de [place], no estamos disponibles las 24 horas del da, los 7 [address]  [address] de la [place].   Si tiene un problema urgente y no puede comunicarse con nosotros, puede optar por buscar atencin mdica  en el consultorio de su doctor(a), en una clnica privada, en un centro de atencin urgente o en una sala de emergencias.  Si tiene Engineer, drilling, por favor llame inmediatamente al 911 o vaya a la sala de emergencias.  Nmeros de bper  - Dr. Gwen Pounds: [phone]  - Dra. Roseanne Reno: [phone]  - Dr. Katrinka Blazing: [phone]   En caso de inclemencias del tiempo, por favor llame a Lacy Duverney principal al [phone] para una actualizacin sobre el [place] de cualquier retraso o cierre.  Consejos para la medicacin en dermatologa: Por favor, guarde las cajas en las que vienen los medicamentos de uso tpico para ayudarle a seguir las instrucciones sobre dnde y cmo usarlos. Las farmacias generalmente imprimen las instrucciones del medicamento slo en las cajas y no directamente en los tubos del [place].   Si su medicamento es muy caro, por favor, pngase en contacto con Rolm Gala llamando al [phone] y presione la opcin 4 o envenos un mensaje a travs de Clinical cytogeneticist.   No podemos decirle cul ser su copago por los medicamentos por adelantado ya que esto es diferente dependiendo de  la cobertura de su seguro. Sin embargo, es posible que podamos encontrar un medicamento sustituto a Audiological scientist un formulario para que el seguro cubra el medicamento que se considera necesario.   Si se requiere una autorizacin previa para que su compaa de seguros [place] su medicamento, por favor permtanos de 1 a 2 das hbiles para completar [address]  Los precios de los medicamentos varan con frecuencia dependiendo del Environmental consultant de dnde se surte la receta y alguna farmacias pueden ofrecer precios ms baratos.  El sitio web www.goodrx.com tiene cupones para medicamentos de Health and safety inspector. Los precios aqu no tienen en cuenta lo que podra costar con la ayuda del seguro (puede ser ms barato con su seguro), pero el sitio web puede darle el precio si no utiliz Tourist information centre manager.  - Puede imprimir el cupn correspondiente y llevarlo con su receta a la farmacia.  - Tambin puede pasar por nuestra oficina durante el horario de atencin regular y Education officer, museum una tarjeta de cupones de GoodRx.  -  Si necesita que su receta se enve electrnicamente a Psychiatrist, informe a nuestra oficina a travs de MyChart de [place] o por telfono llamando al [phone] y presione la opcin 4.

## 2024-03-04 NOTE — Progress Notes (Signed)
   Follow-Up Visit   Subjective  Jackie Hunter is a 78 y.o. female who presents for the following: Keloids of the umbilicus, breast, and right posterior upper arm, itchy. She has had ILK injections to keloids in the past with improvement.    The following portions of the chart were reviewed this encounter and updated as appropriate: medications, allergies, medical history  Review of Systems:  No other skin or systemic complaints except as noted in HPI or Assessment and Plan.  Objective  Well appearing patient in no apparent distress; mood and affect are within normal limits.  Areas Examined: Arms, umbilicus, L breast  Relevant physical exam findings are noted in the Assessment and Plan.    Assessment & Plan   KELOID   Related Medications triamcinolone  acetonide (KENALOG -40) injection 40 mg   KELOID with Pruritus Exam shows Firm pink/brown dermal papule(s)/plaque(s). Right upper arm, umbilicus, left breast Treatment Plan:  Location: right upper arm, umbilicus, left breast  Informed Consent: Discussed risks (infection, pain, bleeding, bruising, thinning of the skin, loss of skin pigment, lack of resolution, and recurrence of lesion) and benefits of the procedure, as well as the alternatives. Informed consent was obtained. Preparation: The area was prepared a standard fashion.  Anesthesia:none  Procedure Details: An intralesional injection was performed with Kenalog  40 mg/cc. 1.5 cc in total were injected. (0.5 cc R upper arm, 0.5 cc umbilicus, 0.5 cc L breast) NDC 83285-849-98 Lot [id number] Exp 01/2025  Total number of injections: >7 0.5cc r upper arm Plan: The patient was instructed on post-op care. Recommend OTC analgesia as needed for pain.   Return 6-8 weeks, for keloids, ILK injections.  IAndrea Kerns, CMA, am acting as scribe for Rexene Rattler, MD .   Documentation: I have reviewed the above documentation for accuracy and completeness, and I agree with  the above.  Rexene Rattler, MD

## 2024-04-15 ENCOUNTER — Ambulatory Visit: Admitting: Physician Assistant

## 2024-04-16 ENCOUNTER — Ambulatory Visit (INDEPENDENT_AMBULATORY_CARE_PROVIDER_SITE_OTHER): Admitting: Physician Assistant

## 2024-04-16 DIAGNOSIS — N3281 Overactive bladder: Secondary | ICD-10-CM | POA: Diagnosis not present

## 2024-04-16 MED ORDER — MIRABEGRON ER 50 MG PO TB24: 50.0000 mg | ORAL_TABLET | Freq: Every day | ORAL | 11 refills | Status: AC

## 2024-04-16 NOTE — Progress Notes (Signed)
 04/16/2024 11:20 AM   Jackie Hunter 1946-01-13 [id number]  CC: Chief Complaint  Patient presents with   Follow-up   Over Active Bladder   HPI: Jackie Hunter is a 78 y.o. female with PMH gross hematuria with benign CTU in 2024 and benign cystoscopy in 2025 and OAB on Myrbetriq  50 mg who presents today for follow-up.   Today she reports overall she is doing well since her last visit.  She will occasionally have urinary incontinence, but admits that typically this occurs when she forgets to take her Myrbetriq .  She admits that she takes multiple medications and managing them is challenging for her.  PMH: Past Medical History:  Diagnosis Date   Adenoma of colon    Allergic rhinitis    Anemia    Anxiety    Arthritis    Asthma    Back pain    Carpal tunnel syndrome    Carpal tunnel syndrome    Carpal tunnel syndrome    Cataract cortical, senile    Cervicalgia    Chronic kidney disease    STAGE 3   CKD (chronic kidney disease)    DDD (degenerative disc disease), lumbar    Diabetes mellitus without complication (HCC)    does not take any medications   Diverticulosis    Gastritis    GERD (gastroesophageal reflux disease)    Headache    History of hiatal hernia    Hypertension    Lipids serum increased    Lumbago    Migraines    Obesity    Osteoarthritis    Stroke [facility])    TIA (transient ischemic attack)    Wrist fracture     Surgical History: Past Surgical History:  Procedure Laterality Date   BREAST BIOPSY Right 2014   NEG   BREAST BIOPSY Left 11/24/2016   us  core, radial scar    BREAST EXCISIONAL BIOPSY Left 1967   NEG   cataracts Bilateral    COLONOSCOPY N/A 10/30/2023   Procedure: COLONOSCOPY;  Surgeon: Onita Elspeth Sharper, DO;  Location: [facility] ENDOSCOPY;  Service: Gastroenterology;  Laterality: N/A;   COLONOSCOPY N/A 01/08/2024   Procedure: COLONOSCOPY;  Surgeon: Onita Elspeth Sharper, DO;  Location: [facility] ENDOSCOPY;  Service: Gastroenterology;   Laterality: N/A;   COLONOSCOPY WITH PROPOFOL  N/A 05/03/2016   Procedure: COLONOSCOPY WITH PROPOFOL ;  Surgeon: Gladis RAYMOND Mariner, MD;  Location: [facility] ENDOSCOPY;  Service: Endoscopy;  Laterality: N/A;   COLONOSCOPY WITH PROPOFOL  N/A 11/01/2016   Procedure: COLONOSCOPY WITH PROPOFOL ;  Surgeon: Gladis RAYMOND Mariner, MD;  Location: [facility] ENDOSCOPY;  Service: Endoscopy;  Laterality: N/A;   COLONOSCOPY WITH PROPOFOL  N/A 11/02/2016   Procedure: COLONOSCOPY WITH PROPOFOL ;  Surgeon: Gladis RAYMOND Mariner, MD;  Location: [facility] ENDOSCOPY;  Service: Endoscopy;  Laterality: N/A;   COLONOSCOPY WITH PROPOFOL  N/A 12/01/2017   Procedure: COLONOSCOPY WITH PROPOFOL ;  Surgeon: Mariner Gladis RAYMOND, MD;  Location: [facility] ENDOSCOPY;  Service: Endoscopy;  Laterality: N/A;   COLONOSCOPY WITH PROPOFOL  N/A 03/02/2018   Procedure: COLONOSCOPY WITH PROPOFOL ;  Surgeon: Mariner Gladis RAYMOND, MD;  Location: [facility] ENDOSCOPY;  Service: Endoscopy;  Laterality: N/A;   COLONOSCOPY WITH PROPOFOL  N/A 12/28/2020   Procedure: COLONOSCOPY WITH PROPOFOL ;  Surgeon: Unk Corinn Skiff, MD;  Location: [facility] ENDOSCOPY;  Service: Gastroenterology;  Laterality: N/A;   ESOPHAGOGASTRODUODENOSCOPY N/A 01/08/2024   Procedure: EGD (ESOPHAGOGASTRODUODENOSCOPY);  Surgeon: Onita Elspeth Sharper, DO;  Location: [facility] ENDOSCOPY;  Service: Gastroenterology;  Laterality: N/A;   ESOPHAGOGASTRODUODENOSCOPY (EGD) WITH PROPOFOL  N/A 10/26/2015   Procedure:  ESOPHAGOGASTRODUODENOSCOPY (EGD) WITH PROPOFOL ;  Surgeon: Deward CINDERELLA Piedmont, MD;  Location: [facility] ENDOSCOPY;  Service: Gastroenterology;  Laterality: N/A;   ESOPHAGOGASTRODUODENOSCOPY (EGD) WITH PROPOFOL  N/A 02/12/2019   Procedure: ESOPHAGOGASTRODUODENOSCOPY (EGD) WITH PROPOFOL ;  Surgeon: Gaylyn Gladis PENNER, MD;  Location: [facility] ENDOSCOPY;  Service: Endoscopy;  Laterality: N/A;   EYE SURGERY     cataract surgery   LIPOMA EXCISION Right 02/17/2022   Procedure: EXCISION LIPOMA;  Surgeon: Tye Millet, DO;  Location: [facility] ORS;  Service:  General;  Laterality: Right;   OOPHORECTOMY     left   POLYPECTOMY  01/08/2024   Procedure: POLYPECTOMY, INTESTINE;  Surgeon: Onita Elspeth Sharper, DO;  Location: [facility] ENDOSCOPY;  Service: Gastroenterology;;   ROBLEY INJECTION  10/30/2023   Procedure: INJECTION, SUBMUCOSAL;  Surgeon: Onita Elspeth Sharper, DO;  Location: [facility] ENDOSCOPY;  Service: Gastroenterology;;    Home Medications:  Allergies as of 04/16/2024       Reactions   Levaquin [levofloxacin In D5w] Other (See Comments)   dizziness   Levofloxacin    Other reaction(s): Dizziness dizziness   Meloxicam    Other reaction(s): Other (See Comments) GI upset   Codeine Anxiety, Other (See Comments)   Other reaction(s): Unknown (See Comments) Increased heart rate Difficulty breathing; arrythmia   Sucralfate  Nausea Only        Medication List        Accurate as of April 16, 2024 11:20 AM. If you have any questions, ask your nurse or doctor.          acetaminophen  325 MG tablet Commonly known as: TYLENOL  Take 2 tablets (650 mg total) by mouth every 6 (six) hours as needed for mild pain, fever or headache.   ALPRAZolam  0.25 MG tablet Commonly known as: XANAX  Take 0.25 mg by mouth at bedtime as needed for anxiety.   ARIPiprazole 2 MG tablet Commonly known as: ABILIFY Take 2 mg by mouth daily.   ascorbic acid 500 MG tablet Commonly known as: VITAMIN C Take 500 mg by mouth daily.   aspirin EC 81 MG tablet Take 81 mg by mouth as needed. Swallow whole.   Azelaic Acid  15 % gel Apply QAM to face, wash off at bedtime   BLACK ELDERBERRY PO Take by mouth as needed.   buPROPion  300 MG 24 hr tablet Commonly known as: WELLBUTRIN  XL Take 300 mg by mouth daily.   CRANBERRY PLUS VITAMIN C PO Take 1 tablet by mouth as needed.   cyanocobalamin  1000 MCG tablet Commonly known as: VITAMIN B12 Take 1,000 mcg by mouth daily.   estradiol 0.1 MG/GM vaginal cream Commonly known as: ESTRACE Place vaginally.    ferrous sulfate 325 (65 FE) MG EC tablet Take 1 tablet by mouth every morning.   fexofenadine 60 MG tablet Commonly known as: ALLEGRA Take 60 mg by mouth as needed.   finasteride  5 MG tablet Commonly known as: PROSCAR  Take 1 tablet (5 mg total) by mouth daily.   Lithium Orotate 5 MG Caps Take by mouth as needed.   losartan 25 MG tablet Commonly known as: COZAAR Take 25 mg by mouth daily. What changed: Another medication with the same name was removed. Continue taking this medication, and follow the directions you see here. Changed by: Kyaire Gruenewald   minoxidil  2.5 MG tablet Commonly known as: LONITEN  Take 1 tablet (2.5 mg total) by mouth daily.   mirabegron  ER 50 MG Tb24 tablet Commonly known as: MYRBETRIQ  Take 1 tablet (50 mg total) by mouth daily.   Omega-3  1000 MG Caps Take 1 capsule by mouth as needed.   pantoprazole  40 MG tablet Commonly known as: Protonix  Take 1 tablet (40 mg total) by mouth daily.   traZODone 50 MG tablet Commonly known as: DESYREL Take 1 tablet by mouth at bedtime.        Allergies:  Allergies  Allergen Reactions   Levaquin [Levofloxacin In D5w] Other (See Comments)    dizziness   Levofloxacin     Other reaction(s): Dizziness dizziness   Meloxicam     Other reaction(s): Other (See Comments) GI upset   Codeine Anxiety and Other (See Comments)    Other reaction(s): Unknown (See Comments) Increased heart rate  Difficulty breathing; arrythmia   Sucralfate  Nausea Only    Family History: Family History  Problem Relation Age of Onset   Leukemia Mother    Arthritis Mother    Leukemia Nephew    Kidney cancer Neg Hx    Prostate cancer Neg Hx    Breast cancer Neg Hx     Social History:   reports that she has never smoked. She has never been exposed to tobacco smoke. She has never used smokeless tobacco. She reports current alcohol use. She reports that she does not use drugs.  Physical Exam: BP 135/82 (BP Location:  Left Arm, Patient Position: Sitting, Cuff Size: Normal)   Pulse 79   SpO2 97%   Constitutional:  Alert and oriented, no acute distress, nontoxic appearing HEENT: [place], AT Cardiovascular: No clubbing, cyanosis, or edema Respiratory: Normal respiratory effort, no increased work of breathing Skin: No rashes, bruises or suspicious lesions Neurologic: Grossly intact, no focal deficits, moving all 4 extremities Psychiatric: Normal mood and affect  Assessment & Plan:   1. OAB (overactive bladder) Symptoms well-managed when taking Myrbetriq , though she admits she forgets this.  Will continue it.  I recommended that she look into local private pharmacies who may be able to offer her pill packs to make medication management easier. - mirabegron  ER (MYRBETRIQ ) 50 MG TB24 tablet; Take 1 tablet (50 mg total) by mouth daily.  Dispense: 30 tablet; Refill: 11  Return in about 1 year (around 04/16/2025) for Annual OAB f/u with PVR.  Lucie Hones, PA-C  [facility] [place] [address] [place], [place] [address] [phone]

## 2024-04-24 ENCOUNTER — Ambulatory Visit: Admitting: Dermatology

## 2024-04-24 DIAGNOSIS — L299 Pruritus, unspecified: Secondary | ICD-10-CM

## 2024-04-24 DIAGNOSIS — L910 Hypertrophic scar: Secondary | ICD-10-CM

## 2024-04-24 MED ORDER — TRIAMCINOLONE ACETONIDE 40 MG/ML IJ SUSP
40.0000 mg | Freq: Once | INTRAMUSCULAR | Status: AC
  Administered 2024-04-24: 40 mg

## 2024-04-24 NOTE — Progress Notes (Signed)
   Follow-Up Visit   Subjective  Jackie Hunter is a 78 y.o. female who presents for the following: Keloids at the umbilicus, R upper arm, L breast. Improving with ILK injections, but still firm at the umbilicus with itching.   The following portions of the chart were reviewed this encounter and updated as appropriate: medications, allergies, medical history  Review of Systems:  No other skin or systemic complaints except as noted in HPI or Assessment and Plan.  Objective  Well appearing patient in no apparent distress; mood and affect are within normal limits.  Areas Examined: Breast, right upper arm, umbilicus  Relevant physical exam findings are noted in the Assessment and Plan.    Assessment & Plan   KELOID   Related Medications triamcinolone  acetonide (KENALOG -40) injection 40 mg   KELOID with pruritus Exam shows Firm pink/brown dermal papule(s)/plaque(s) at the right upper arm, umbilicus, left breast with softening- two slightly firm papules med and inf breast  Treatment Plan:  Location: right upper arm, umbilicus, left breast  Informed Consent: Discussed risks (infection, pain, bleeding, bruising, thinning of the skin, loss of skin pigment, lack of resolution, and recurrence of lesion) and benefits of the procedure, as well as the alternatives. Informed consent was obtained. Preparation: The area was prepared a standard fashion.  Anesthesia:none  Procedure Details: An intralesional injection was performed with Kenalog  40 mg/cc. 0.6 cc in total were injected. NDC 83285-849-98 Lot [id number] Exp 10/15/2025  Total number of injections: >7  Plan: The patient was instructed on post-op care. Recommend OTC analgesia as needed for pain.  Recommend OTC Serica moisturizing scar formula cream every night or [facility] brand or Mederma silicone scar sheet every night for the first year after a scar appears to help with scar remodeling if desired.  Return in about 2 months  (around 06/24/2024) for keloids, ILK injection.  IAndrea Kerns, CMA, am acting as scribe for Rexene Rattler, MD .   Documentation: I have reviewed the above documentation for accuracy and completeness, and I agree with the above.  Rexene Rattler, MD

## 2024-04-24 NOTE — Patient Instructions (Signed)

## 2024-05-23 ENCOUNTER — Emergency Department
Admission: EM | Admit: 2024-05-23 | Discharge: 2024-05-23 | Disposition: A | Discharge status: Home / Self Care | Attending: Emergency Medicine | Admitting: Emergency Medicine

## 2024-05-23 ENCOUNTER — Emergency Department

## 2024-05-23 ENCOUNTER — Other Ambulatory Visit: Payer: Self-pay

## 2024-05-23 DIAGNOSIS — I10 Essential (primary) hypertension: Secondary | ICD-10-CM | POA: Insufficient documentation

## 2024-05-23 DIAGNOSIS — Z79899 Other long term (current) drug therapy: Secondary | ICD-10-CM | POA: Insufficient documentation

## 2024-05-23 DIAGNOSIS — R519 Headache, unspecified: Secondary | ICD-10-CM | POA: Diagnosis present

## 2024-05-23 DIAGNOSIS — R1013 Epigastric pain: Secondary | ICD-10-CM | POA: Diagnosis not present

## 2024-05-23 LAB — BASIC METABOLIC PANEL WITH GFR
Anion gap: 10 (ref 5–15)
BUN: 14 mg/dL (ref 8–23)
CO2: 23 mmol/L (ref 22–32)
Calcium: 8.8 mg/dL — ABNORMAL LOW (ref 8.9–10.3)
Chloride: 105 mmol/L (ref 98–111)
Creatinine, Ser: 1.03 mg/dL — ABNORMAL HIGH (ref 0.44–1.00)
GFR, Estimated: 55 mL/min — ABNORMAL LOW (ref >60)
Glucose, Bld: 90 mg/dL (ref 70–99)
Potassium: 3.7 mmol/L (ref 3.5–5.1)
Sodium: 138 mmol/L (ref 135–145)

## 2024-05-23 LAB — HEPATIC FUNCTION PANEL
ALT: 15 U/L (ref 0–44)
AST: 21 U/L (ref 15–41)
Albumin: 3.7 g/dL (ref 3.5–5.0)
Alkaline Phosphatase: 49 U/L (ref 38–126)
Bilirubin, Direct: <0.1 mg/dL (ref 0.0–0.2)
Total Bilirubin: 0.6 mg/dL (ref 0.0–1.2)
Total Protein: 7.3 g/dL (ref 6.5–8.1)

## 2024-05-23 LAB — CBC WITH DIFFERENTIAL/PLATELET
Abs Immature Granulocytes: 0.01 K/uL (ref 0.00–0.07)
Basophils Absolute: 0 K/uL (ref 0.0–0.1)
Basophils Relative: 0 %
Eosinophils Absolute: 0.1 K/uL (ref 0.0–0.5)
Eosinophils Relative: 3 %
HCT: 40.6 % (ref 36.0–46.0)
Hemoglobin: 13.5 g/dL (ref 12.0–15.0)
Immature Granulocytes: 0 %
Lymphocytes Relative: 45 %
Lymphs Abs: 2.1 K/uL (ref 0.7–4.0)
MCH: 30.2 pg (ref 26.0–34.0)
MCHC: 33.3 g/dL (ref 30.0–36.0)
MCV: 90.8 fL (ref 80.0–100.0)
Monocytes Absolute: 0.4 K/uL (ref 0.1–1.0)
Monocytes Relative: 9 %
Neutro Abs: 2 K/uL (ref 1.7–7.7)
Neutrophils Relative %: 43 %
Platelets: 358 K/uL (ref 150–400)
RBC: 4.47 MIL/uL (ref 3.87–5.11)
RDW: 14.6 % (ref 11.5–15.5)
WBC: 4.7 K/uL (ref 4.0–10.5)
nRBC: 0 % (ref 0.0–0.2)

## 2024-05-23 LAB — LIPASE, BLOOD: Lipase: 53 U/L — ABNORMAL HIGH (ref 11–51)

## 2024-05-23 LAB — TROPONIN I (HIGH SENSITIVITY): Troponin I (High Sensitivity): 5 ng/L (ref <18)

## 2024-05-23 MED ORDER — PANTOPRAZOLE SODIUM 40 MG IV SOLR
40.0000 mg | Freq: Once | INTRAVENOUS | Status: AC
  Administered 2024-05-23: 40 mg via INTRAVENOUS
  Filled 2024-05-23: qty 10

## 2024-05-23 MED ORDER — ALUM & MAG HYDROXIDE-SIMETH 200-200-20 MG/5ML PO SUSP
30.0000 mL | Freq: Once | ORAL | Status: AC
  Administered 2024-05-23: 30 mL via ORAL
  Filled 2024-05-23: qty 30

## 2024-05-23 NOTE — ED Provider Notes (Addendum)
 [facility] Provider Note    Event Date/Time   First MD Initiated Contact with Patient 05/23/24 0055     (approximate)   History   Hypertension (BIBA due to uncontrolled HTN for over 1 week.  PCP refilled Losartan at 25mg  but patient took 50mg  today and still no relief with headaches.  They are on and off all week and worse today.)   HPI Jackie Hunter is a 78 y.o. female with a history of hypertension as well as a host of other chronic medical issues.  Dr. Sadie is her PCP.  She presents for evaluation of 2 main complaints.  She states that her blood pressure has been high over the last week and that her PCP has been working with her to find a medication regimen that works.  She has increased her losartan from 25 mg daily to 50 mg but she is still having headaches that have been on and off all week but worse today.  She is worried about the blood pressure but acknowledges that it was better tonight than it has been at home.  She also reports that she has been having what she believes is acid reflux.  She takes medicine before she eats but said that quite often after she eats she has pain in the upper and right upper part of her abdomen that occasionally radiates through to her back or around to the side.  Sometimes she has nausea but she has not been vomiting.     Physical Exam   Triage Vital Signs: ED Triage Vitals  Encounter Vitals Group     BP 05/23/24 0057 (!) 150/70     Girls Systolic BP Percentile --      Girls Diastolic BP Percentile --      Boys Systolic BP Percentile --      Boys Diastolic BP Percentile --      Pulse Rate 05/23/24 0057 66     Resp 05/23/24 0057 18     Temp 05/23/24 0057 98 F (36.7 C)     Temp Source 05/23/24 0057 Oral     SpO2 05/23/24 0057 99 %     Weight 05/23/24 0100 73.9 kg (162 lb 14.7 oz)     Height 05/23/24 0100 1.6 m (5' 3)     Head Circumference --      Peak Flow --      Pain Score 05/23/24 0059 6     Pain Loc  --      Pain Education --      Exclude from Growth Chart --     Most recent vital signs: Vitals:   05/23/24 0500 05/23/24 0515  BP:  (!) 192/68  Pulse: 62 62  Resp:  18  Temp:    SpO2: 99% 99%    General: Awake, well-appearing, appears younger than chronological age, pleasant and conversant. CV:  Good peripheral perfusion.  Regular rate and rhythm.  Normal heart sounds. Resp:  Normal effort. Speaking easily and comfortably, no accessory muscle usage nor intercostal retractions.  Lungs are clear to auscultation bilaterally. Abd:  No distention.  Abdomen is soft.  Patient has mild tenderness to epigastrium but more tenderness to the right upper quadrant with equivocal Murphy sign.   ED Results / Procedures / Treatments   Labs (all labs ordered are listed, but only abnormal results are displayed) Labs Reviewed  BASIC METABOLIC PANEL WITH GFR - Abnormal; Notable for the following components:  Result Value   Creatinine, Ser 1.03 (*)    Calcium  8.8 (*)    GFR, Estimated 55 (*)    All other components within normal limits  LIPASE, BLOOD - Abnormal; Notable for the following components:   Lipase 53 (*)    All other components within normal limits  CBC WITH DIFFERENTIAL/PLATELET  HEPATIC FUNCTION PANEL  TROPONIN I (HIGH SENSITIVITY)     EKG  ED ECG REPORT I, Darleene Dome, the attending physician, personally viewed and interpreted this ECG.  Date: 05/23/2024 EKG Time: 3:54 AM Rate: 61 Rhythm: normal sinus rhythm QRS Axis: normal Intervals: normal ST/T Wave abnormalities: normal Narrative Interpretation: no evidence of acute ischemia    RADIOLOGY See ED course for details   PROCEDURES:  Critical Care performed: No  Procedures    IMPRESSION / MDM / ASSESSMENT AND PLAN / ED COURSE  I reviewed the triage vital signs and the nursing notes.                              Differential diagnosis includes, but is not limited to, ACS, intracranial hemorrhage,  metabolic or electrolyte abnormality, acute renal dysfunction, acid reflux, biliary colic or other gallbladder disease.  Patient's presentation is most consistent with acute presentation with potential threat to life or bodily function.  Labs/studies ordered: EKG, CT head, CBC with differential, BMP, hepatic function panel, lipase, high-sensitivity troponin  Interventions/Medications given:  Medications  pantoprazole  (PROTONIX ) injection 40 mg (40 mg Intravenous Given 05/23/24 0536)  alum & mag hydroxide-simeth (MAALOX/MYLANTA) 200-200-20 MG/5ML suspension 30 mL (30 mLs Oral Given 05/23/24 0536)    (Note:  hospital course my include additional interventions and/or labs/studies not listed above.)   Low suspicion for ACS but will evaluate with EKG and troponin.  Vital signs are reassuring other than high blood pressure but it is not severe.  Patient feels better right now, her greatest concern is the right upper quadrant and epigastric discomfort that she believes is acid reflux.  Given her history and exam, I am evaluating with standard lab work and we will get a right upper quadrant ultrasound.  No need for chest x-ray at this time.     Clinical Course as of 05/23/24 0635  Thu May 23, 2024  0410 Lipase(!): 53 Minimally elevated lipase of 53, unclear clinical significance, we will proceed with ultrasound [CF]  0411 Labs otherwise reassuring, minimal elevation of creatinine but this is actually better than her baseline.  Hepatic function panel is normal, troponin is negative, CBC stable. [CF]  0436 CT Head Wo Contrast I independently viewed and interpreted the patient's head CT.  I ordered this because of her report of intermittent headaches this week with elevated blood pressure.  Fortunately there is no evidence of intracranial hemorrhage or other acute abnormality.  I also read the radiologist's report, which confirmed no acute findings. [CF]  0604 US  ABDOMEN LIMITED RUQ (LIVER/GB) I  independently viewed and interpreted the patient's ultrasound and there is no evidence of cholelithiasis or other gallbladder disease.  I ordered the medications listed above to ease the patient's symptoms and I anticipate discharge for outpatient follow-up. [CF]  [phone] Patient has been resting comfortably.  I reassessed her and she says she feels much better and is ready to go.  No tenderness to palpation of the abdomen.  Suspect acid reflux and the patient will follow-up as an outpatient.  I gave my usual and customary follow-up recommendations  and return precautions. [CF]  [phone] Of note, the patient's blood pressure has gone up significantly, likely because she is due to needing her medications.  She meets criteria for uncontrolled hypertension, but she is asymptomatic from it, and she is managed by her primary care doctor and they are actively making medication changes.  I will recommend no changes at this time but will recommend close follow-up. [CF]    Clinical Course User Index [CF] Gordan Huxley, MD     FINAL CLINICAL IMPRESSION(S) / ED DIAGNOSES   Final diagnoses:  Dyspepsia  Uncontrolled hypertension     Rx / DC Orders   ED Discharge Orders     None        Note:  This document was prepared using Dragon voice recognition software and may include unintentional dictation errors.   Gordan Huxley, MD 05/23/24 9366    Gordan Huxley, MD 05/23/24 [phone]

## 2024-05-23 NOTE — Discharge Instructions (Addendum)
 Your workup in the Emergency Department today was reassuring.  We did not find any specific abnormalities.  We recommend you drink plenty of fluids, take your regular medications and/or any new ones prescribed today, and follow up with the doctor(s) listed in these documents as recommended.  Of note, your blood pressure is extremely variable but is running high tonight.  We may cause more harm than good by changing your medications tonight, so we encourage you to follow-up with your regular doctor at the next available opportunity.  There may be additional changes to your blood pressure at the need to be made.  Return to the Emergency Department if you develop new or worsening symptoms that concern you.

## 2024-06-04 ENCOUNTER — Encounter: Payer: Self-pay | Admitting: Ophthalmology

## 2024-06-04 NOTE — Anesthesia Preprocedure Evaluation (Addendum)
 Anesthesia Evaluation  Patient identified by MRN, date of birth, ID band Patient awake    Reviewed: Allergy & Precautions, H&P , NPO status , Patient's Chart, lab work & pertinent test results  Airway Mallampati: III  TM Distance: >3 FB Neck ROM: Full    Dental no notable dental hx.    Pulmonary neg pulmonary ROS, asthma    Pulmonary exam normal breath sounds clear to auscultation       Cardiovascular hypertension, negative cardio ROS Normal cardiovascular exam Rhythm:Regular Rate:Normal     Neuro/Psych  Headaches PSYCHIATRIC DISORDERS Anxiety Depression    TIA Neuromuscular disease CVA negative neurological ROS  negative psych ROS   GI/Hepatic negative GI ROS, Neg liver ROS, hiatal hernia,GERD  ,,  Endo/Other  negative endocrine ROSdiabetes    Renal/GU Renal diseasenegative Renal ROS  negative genitourinary   Musculoskeletal negative musculoskeletal ROS (+) Arthritis ,    Abdominal   Peds negative pediatric ROS (+)  Hematology negative hematology ROS (+) Blood dyscrasia, anemia   Anesthesia Other Findings  Hypertension  Back pain Stroke (HCC)  Asthma GERD (gastroesophageal reflux disease) History of hiatal hernia Headache  Lumbago TIA (transient ischemic attack) Lipids serum increased Obesity  Migraines Carpal tunnel syndrome  Cervicalgia Anxiety  Allergic rhinitis Wrist fracture  Chronic kidney disease Arthritis  Osteoarthritis DDD (degenerative disc disease), lumbar Carpal tunnel syndrome Cataract cortical, senile  CKD (chronic kidney disease) Diverticulosis Gastritis Carpal tunnel syndrome  Adenoma of colon Diabetes mellitus without complication Anemia Depression     Reproductive/Obstetrics negative OB ROS                              Anesthesia Physical Anesthesia Plan  ASA: 3  Anesthesia Plan: MAC   Post-op Pain Management:    Induction:  Intravenous  PONV Risk Score and Plan:   Airway Management Planned: Natural Airway and Nasal Cannula  Additional Equipment:   Intra-op Plan:   Post-operative Plan:   Informed Consent: I have reviewed the patients History and Physical, chart, labs and discussed the procedure including the risks, benefits and alternatives for the proposed anesthesia with the patient or authorized representative who has indicated his/her understanding and acceptance.     Dental Advisory Given  Plan Discussed with: Anesthesiologist, CRNA and Surgeon  Anesthesia Plan Comments: (Patient consented for risks of anesthesia including but not limited to:  - adverse reactions to medications - damage to eyes, teeth, lips or other oral mucosa - nerve damage due to positioning  - sore throat or hoarseness - Damage to heart, brain, nerves, lungs, other parts of body or loss of life  Patient voiced understanding and assent.)         Anesthesia Quick Evaluation

## 2024-06-04 NOTE — Discharge Instructions (Signed)

## 2024-06-06 ENCOUNTER — Encounter: Payer: Self-pay | Admitting: Ophthalmology

## 2024-06-06 ENCOUNTER — Ambulatory Visit
Admission: RE | Admit: 2024-06-06 | Discharge: 2024-06-06 | Disposition: A | Discharge status: Home / Self Care | Attending: Ophthalmology | Admitting: Ophthalmology

## 2024-06-06 ENCOUNTER — Encounter: Admitting: Anesthesiology

## 2024-06-06 ENCOUNTER — Other Ambulatory Visit: Payer: Self-pay

## 2024-06-06 ENCOUNTER — Ambulatory Visit: Admitting: Anesthesiology

## 2024-06-06 ENCOUNTER — Encounter
Admission: RE | Disposition: A | Discharge status: Home / Self Care | Payer: Self-pay | Source: Home / Self Care | Attending: Ophthalmology

## 2024-06-06 DIAGNOSIS — K449 Diaphragmatic hernia without obstruction or gangrene: Secondary | ICD-10-CM | POA: Diagnosis not present

## 2024-06-06 DIAGNOSIS — E1136 Type 2 diabetes mellitus with diabetic cataract: Secondary | ICD-10-CM | POA: Insufficient documentation

## 2024-06-06 DIAGNOSIS — Z8673 Personal history of transient ischemic attack (TIA), and cerebral infarction without residual deficits: Secondary | ICD-10-CM | POA: Diagnosis not present

## 2024-06-06 DIAGNOSIS — F32A Depression, unspecified: Secondary | ICD-10-CM | POA: Insufficient documentation

## 2024-06-06 DIAGNOSIS — K219 Gastro-esophageal reflux disease without esophagitis: Secondary | ICD-10-CM | POA: Diagnosis not present

## 2024-06-06 DIAGNOSIS — J45909 Unspecified asthma, uncomplicated: Secondary | ICD-10-CM | POA: Insufficient documentation

## 2024-06-06 DIAGNOSIS — H25012 Cortical age-related cataract, left eye: Secondary | ICD-10-CM | POA: Insufficient documentation

## 2024-06-06 DIAGNOSIS — N189 Chronic kidney disease, unspecified: Secondary | ICD-10-CM | POA: Diagnosis not present

## 2024-06-06 DIAGNOSIS — H2512 Age-related nuclear cataract, left eye: Secondary | ICD-10-CM | POA: Insufficient documentation

## 2024-06-06 DIAGNOSIS — E1122 Type 2 diabetes mellitus with diabetic chronic kidney disease: Secondary | ICD-10-CM | POA: Insufficient documentation

## 2024-06-06 DIAGNOSIS — I129 Hypertensive chronic kidney disease with stage 1 through stage 4 chronic kidney disease, or unspecified chronic kidney disease: Secondary | ICD-10-CM | POA: Diagnosis not present

## 2024-06-06 DIAGNOSIS — F419 Anxiety disorder, unspecified: Secondary | ICD-10-CM | POA: Insufficient documentation

## 2024-06-06 HISTORY — DX: Depression, unspecified: F32.A

## 2024-06-06 HISTORY — PX: CATARACT EXTRACTION W/PHACO: SHX586

## 2024-06-06 SURGERY — PHACOEMULSIFICATION, CATARACT, WITH IOL INSERTION
Anesthesia: Monitor Anesthesia Care | Site: Eye | Laterality: Left

## 2024-06-06 MED ORDER — SIGHTPATH DOSE#1 NA HYALUR & NA CHOND-NA HYALUR IO KIT
PACK | INTRAOCULAR | Status: DC | PRN
  Administered 2024-06-06: 1 via OPHTHALMIC

## 2024-06-06 MED ORDER — LACTATED RINGERS IV SOLN: INTRAVENOUS | Status: DC

## 2024-06-06 MED ORDER — BRIMONIDINE TARTRATE-TIMOLOL 0.2-0.5 % OP SOLN
OPHTHALMIC | Status: DC | PRN
Start: 2024-06-06 — End: 2024-06-06
  Administered 2024-06-06: 1 [drp] via OPHTHALMIC

## 2024-06-06 MED ORDER — MIDAZOLAM HCL 2 MG/2ML IJ SOLN
INTRAMUSCULAR | Status: AC
  Filled 2024-06-06: qty 2

## 2024-06-06 MED ORDER — TRYPAN BLUE 0.06 % IO SOSY
PREFILLED_SYRINGE | INTRAOCULAR | Status: DC | PRN
  Administered 2024-06-06: .5 mL via INTRAOCULAR

## 2024-06-06 MED ORDER — FENTANYL CITRATE (PF) 100 MCG/2ML IJ SOLN
INTRAMUSCULAR | Status: DC | PRN
  Administered 2024-06-06: 50 ug via INTRAVENOUS

## 2024-06-06 MED ORDER — CYCLOPENTOLATE HCL 2 % OP SOLN
1.0000 [drp] | OPHTHALMIC | Status: AC
  Administered 2024-06-06 (×3): 1 [drp] via OPHTHALMIC

## 2024-06-06 MED ORDER — LIDOCAINE HCL (PF) 2 % IJ SOLN
INTRAOCULAR | Status: DC | PRN
  Administered 2024-06-06: 4 mL via INTRAOCULAR

## 2024-06-06 MED ORDER — TETRACAINE HCL 0.5 % OP SOLN
1.0000 [drp] | OPHTHALMIC | Status: DC | PRN
Start: 2024-06-06 — End: 2024-06-06
  Administered 2024-06-06 (×3): 1 [drp] via OPHTHALMIC

## 2024-06-06 MED ORDER — PHENYLEPHRINE HCL 10 % OP SOLN
1.0000 [drp] | OPHTHALMIC | Status: AC
  Administered 2024-06-06 (×3): 1 [drp] via OPHTHALMIC

## 2024-06-06 MED ORDER — MIDAZOLAM HCL (PF) 2 MG/2ML IJ SOLN
INTRAMUSCULAR | Status: DC | PRN
  Administered 2024-06-06 (×2): .5 mg via INTRAVENOUS

## 2024-06-06 MED ORDER — MOXIFLOXACIN HCL 0.5 % OP SOLN
OPHTHALMIC | Status: DC | PRN
  Administered 2024-06-06: .2 mL via OPHTHALMIC

## 2024-06-06 MED ORDER — PHENYLEPHRINE-KETOROLAC 1-0.3 % IO SOLN
INTRAOCULAR | Status: DC | PRN
  Administered 2024-06-06: 77 mL via OPHTHALMIC

## 2024-06-06 MED ORDER — PHENYLEPHRINE HCL 10 % OP SOLN
OPHTHALMIC | Status: AC
  Filled 2024-06-06: qty 5

## 2024-06-06 MED ORDER — CYCLOPENTOLATE HCL 2 % OP SOLN
OPHTHALMIC | Status: AC
Start: 2024-06-06 — End: 2024-06-06
  Filled 2024-06-06: qty 2

## 2024-06-06 MED ORDER — CYCLOPENTOLATE HCL 2 % OP SOLN
OPHTHALMIC | Status: AC
  Filled 2024-06-06: qty 2

## 2024-06-06 MED ORDER — TETRACAINE HCL 0.5 % OP SOLN
OPHTHALMIC | Status: AC
  Filled 2024-06-06: qty 4

## 2024-06-06 MED ORDER — SIGHTPATH DOSE#1 BSS IO SOLN
INTRAOCULAR | Status: DC | PRN
  Administered 2024-06-06: 15 mL via INTRAOCULAR

## 2024-06-06 MED ORDER — FENTANYL CITRATE (PF) 100 MCG/2ML IJ SOLN
INTRAMUSCULAR | Status: AC
  Filled 2024-06-06: qty 2

## 2024-06-06 SURGICAL SUPPLY — 12 items
CUTTER IOL 19 GA PARKER CHANG (INSTRUMENTS) IMPLANT
DISSECTOR HYDRO NUCLEUS 50X22 (MISCELLANEOUS) ×1 IMPLANT
DRSG TEGADERM 2-3/8X2-3/4 SM (GAUZE/BANDAGES/DRESSINGS) ×1 IMPLANT
FEE CATARACT SUITE SIGHTPATH (MISCELLANEOUS) ×1 IMPLANT
FORCEPS MICRO-HOLDING 23GA (INSTRUMENTS) IMPLANT
GLOVE BIOGEL PI IND STRL 8 (GLOVE) ×1 IMPLANT
GLOVE SURG LX STRL 7.5 STRW (GLOVE) ×1 IMPLANT
GLOVE SURG SYN 6.5 PF PI BL (GLOVE) ×1 IMPLANT
LENS IOL CLRN 22.0 (Intraocular Lens) IMPLANT
NDL FILTER BLUNT 18X1 1/2 (NEEDLE) ×1 IMPLANT
NEEDLE FILTER BLUNT 18X1 1/2 (NEEDLE) ×1 IMPLANT
SYR 3ML LL SCALE MARK (SYRINGE) ×1 IMPLANT

## 2024-06-06 NOTE — Anesthesia Postprocedure Evaluation (Signed)
 Anesthesia Post Note  Patient: SANAH KRASKA  Procedure(s) Performed: PHACOEMULSIFICATION, CATARACT, WITH IOL INSERTION 11.34 01:01.3 (Left: Eye)  Patient location during evaluation: PACU Anesthesia Type: MAC Level of consciousness: awake and alert Pain management: pain level controlled Vital Signs Assessment: post-procedure vital signs reviewed and stable Respiratory status: spontaneous breathing, nonlabored ventilation, respiratory function stable and patient connected to nasal cannula oxygen Cardiovascular status: stable and blood pressure returned to baseline Postop Assessment: no apparent nausea or vomiting Anesthetic complications: no   No notable events documented.   Last Vitals:  Vitals:   06/06/24 1150 06/06/24 1156  BP: (!) 153/53 137/79  Pulse: 63 62  Resp: 18 15  Temp: 36.7 C   SpO2: 98% 99%    Last Pain:  Vitals:   06/06/24 1156  TempSrc:   PainSc: 0-No pain                 Della Scrivener C Tracey Stewart

## 2024-06-06 NOTE — H&P (Signed)
 [facility]   Primary Care Physician:  Sadie Manna, MD Ophthalmologist: Dr. Feliciano Ober  Pre-Procedure History & Physical: HPI:  Jackie Hunter is a 78 y.o. female here for cataract surgery.   Past Medical History:  Diagnosis Date   Adenoma of colon    Allergic rhinitis    Anemia    Anxiety    Arthritis    Asthma    years ago and spring time it bothers her   Back pain    Carpal tunnel syndrome    Carpal tunnel syndrome    Carpal tunnel syndrome    Cataract cortical, senile    Cervicalgia    Chronic kidney disease    STAGE 3   CKD (chronic kidney disease)    DDD (degenerative disc disease), lumbar    Depression    Diabetes mellitus without complication (HCC)    does not take any medications   Diverticulosis    Gastritis    GERD (gastroesophageal reflux disease)    Headache    History of hiatal hernia    Hypertension    Lipids serum increased    Lumbago    Migraines    Obesity    Osteoarthritis    Stroke [facility])    TIA (transient ischemic attack)    Wrist fracture     Past Surgical History:  Procedure Laterality Date   BREAST BIOPSY Right 2014   NEG   BREAST BIOPSY Left 11/24/2016   us  core, radial scar    BREAST EXCISIONAL BIOPSY Left 1967   NEG   cataracts Bilateral    COLONOSCOPY N/A 10/30/2023   Procedure: COLONOSCOPY;  Surgeon: Onita Elspeth Sharper, DO;  Location: [facility] ENDOSCOPY;  Service: Gastroenterology;  Laterality: N/A;   COLONOSCOPY N/A 01/08/2024   Procedure: COLONOSCOPY;  Surgeon: Onita Elspeth Sharper, DO;  Location: [facility] ENDOSCOPY;  Service: Gastroenterology;  Laterality: N/A;   COLONOSCOPY WITH PROPOFOL  N/A 05/03/2016   Procedure: COLONOSCOPY WITH PROPOFOL ;  Surgeon: Gladis RAYMOND Mariner, MD;  Location: [facility] ENDOSCOPY;  Service: Endoscopy;  Laterality: N/A;   COLONOSCOPY WITH PROPOFOL  N/A 11/01/2016   Procedure: COLONOSCOPY WITH PROPOFOL ;  Surgeon: Gladis RAYMOND Mariner, MD;  Location: [facility] ENDOSCOPY;  Service: Endoscopy;  Laterality:  N/A;   COLONOSCOPY WITH PROPOFOL  N/A 11/02/2016   Procedure: COLONOSCOPY WITH PROPOFOL ;  Surgeon: Gladis RAYMOND Mariner, MD;  Location: [facility] ENDOSCOPY;  Service: Endoscopy;  Laterality: N/A;   COLONOSCOPY WITH PROPOFOL  N/A 12/01/2017   Procedure: COLONOSCOPY WITH PROPOFOL ;  Surgeon: Mariner Gladis RAYMOND, MD;  Location: [facility] ENDOSCOPY;  Service: Endoscopy;  Laterality: N/A;   COLONOSCOPY WITH PROPOFOL  N/A 03/02/2018   Procedure: COLONOSCOPY WITH PROPOFOL ;  Surgeon: Mariner Gladis RAYMOND, MD;  Location: [facility] ENDOSCOPY;  Service: Endoscopy;  Laterality: N/A;   COLONOSCOPY WITH PROPOFOL  N/A 12/28/2020   Procedure: COLONOSCOPY WITH PROPOFOL ;  Surgeon: Unk Corinn Skiff, MD;  Location: [facility] ENDOSCOPY;  Service: Gastroenterology;  Laterality: N/A;   ESOPHAGOGASTRODUODENOSCOPY N/A 01/08/2024   Procedure: EGD (ESOPHAGOGASTRODUODENOSCOPY);  Surgeon: Onita Elspeth Sharper, DO;  Location: [facility] ENDOSCOPY;  Service: Gastroenterology;  Laterality: N/A;   ESOPHAGOGASTRODUODENOSCOPY (EGD) WITH PROPOFOL  N/A 10/26/2015   Procedure: ESOPHAGOGASTRODUODENOSCOPY (EGD) WITH PROPOFOL ;  Surgeon: Deward CINDERELLA Piedmont, MD;  Location: [facility] ENDOSCOPY;  Service: Gastroenterology;  Laterality: N/A;   ESOPHAGOGASTRODUODENOSCOPY (EGD) WITH PROPOFOL  N/A 02/12/2019   Procedure: ESOPHAGOGASTRODUODENOSCOPY (EGD) WITH PROPOFOL ;  Surgeon: Mariner Gladis RAYMOND, MD;  Location: [facility] ENDOSCOPY;  Service: Endoscopy;  Laterality: N/A;   EYE SURGERY     cataract surgery   LIPOMA EXCISION  Right 02/17/2022   Procedure: EXCISION LIPOMA;  Surgeon: Tye Millet, DO;  Location: [facility] ORS;  Service: General;  Laterality: Right;   OOPHORECTOMY     left   POLYPECTOMY  01/08/2024   Procedure: POLYPECTOMY, INTESTINE;  Surgeon: Onita Elspeth Sharper, DO;  Location: [facility] ENDOSCOPY;  Service: Gastroenterology;;   SUBMUCOSAL INJECTION  10/30/2023   Procedure: INJECTION, SUBMUCOSAL;  Surgeon: Onita Elspeth Sharper, DO;  Location: [facility] ENDOSCOPY;  Service: Gastroenterology;;     Prior to Admission medications   Medication Sig Start Date End Date Taking? Authorizing Provider  amLODipine (NORVASC) 2.5 MG tablet Take 2.5 mg by mouth daily.   Yes [provider]  Multiple Vitamins-Minerals (VITA HAIR) TABS Take 1 tablet by mouth daily. HAIR, SKIN AND NAILS   Yes [provider]  acetaminophen  (TYLENOL ) 325 MG tablet Take 2 tablets (650 mg total) by mouth every 6 (six) hours as needed for mild pain, fever or headache. 12/31/20   Elgergawy, Brayton RAMAN, MD  ALPRAZolam  (XANAX ) 0.25 MG tablet Take 0.25 mg by mouth at bedtime as needed for anxiety.    [provider]  Azelaic Acid  15 % gel Apply QAM to face, wash off at bedtime 01/04/23   Jackquline Sawyer, MD  buPROPion  (WELLBUTRIN  XL) 300 MG 24 hr tablet Take 300 mg by mouth daily. 03/10/21   [provider]  ferrous sulfate 325 (65 FE) MG EC tablet Take 1 tablet by mouth every morning.    [provider]  fexofenadine (ALLEGRA) 60 MG tablet Take 60 mg by mouth as needed.    [provider]  minoxidil  (LONITEN ) 2.5 MG tablet Take 1 tablet (2.5 mg total) by mouth daily. 01/04/23   Jackquline Sawyer, MD  mirabegron  ER (MYRBETRIQ ) 50 MG TB24 tablet Take 1 tablet (50 mg total) by mouth daily. 04/16/24   Vaillancourt, Samantha, PA-C  pantoprazole  (PROTONIX ) 40 MG tablet Take 1 tablet (40 mg total) by mouth daily. 12/31/20   Elgergawy, Brayton RAMAN, MD  vitamin B-12 (CYANOCOBALAMIN ) 1000 MCG tablet Take 1,000 mcg by mouth daily.    [provider]  vitamin C (ASCORBIC ACID) 500 MG tablet Take 500 mg by mouth daily.    [provider]    Allergies as of 05/01/2024 - Review Complete 04/24/2024  Allergen Reaction Noted   Levaquin [levofloxacin in d5w] Other (See Comments) 10/31/2016   Levofloxacin  10/04/2016   Meloxicam  05/02/2016   Codeine Anxiety and Other (See Comments) 02/07/2013   Sucralfate  Nausea Only 01/10/2020    Family History  Problem Relation Age of Onset    Leukemia Mother    Arthritis Mother    Leukemia Nephew    Kidney cancer Neg Hx    Prostate cancer Neg Hx    Breast cancer Neg Hx     Social History   Socioeconomic History   Marital status: Single    Spouse name: Not on file   Number of children: Not on file   Years of education: Not on file   Highest education level: Not on file  Occupational History   Not on file  Tobacco Use   Smoking status: Never    Passive exposure: Never   Smokeless tobacco: Never  Vaping Use   Vaping status: Never Used  Substance and Sexual Activity   Alcohol use: Yes    Comment: rarely   Drug use: Never   Sexual activity: Yes    Birth control/protection: None  Other Topics Concern   Not on file  Social History Narrative   Lives alone   Social Drivers of Health   Financial Resource Strain: Low Risk  (04/08/2024)   Received from [facility]   Overall Financial Resource Strain (CARDIA)    Difficulty of Paying Living Expenses: Not hard at all  Food Insecurity: No Food Insecurity (04/08/2024)   Received from [facility]   Hunger Vital Sign    Within the past 12 months, you worried that your food would run out before you got the money to buy more.: Never true    Within the past 12 months, the food you bought just didn't last and you didn't have money to get more.: Never true  Transportation Needs: No Transportation Needs (04/08/2024)   Received from [facility] - [facility]    In the past 12 months, has lack of transportation kept you from medical appointments or from getting medications?: No    Lack of Transportation (Non-Medical): No  Physical Activity: Not on file  Stress: Not on file  Social Connections: Unknown (04/29/2022)   Received from [facility] and [employer] [facility]    Help with Day-to-Day Activities: Not on file    Lonely or Isolated: Not on file  Intimate Partner Violence: Unknown  (04/29/2022)   Received from [facility] [employer]   Abuse Screen    Unsafe at Home or Work/School: Not on file    Feels Threatened by Someone?: Not on file    Does Anyone Keep You from Contacting Others or Doint Things Outside the Home?: Not on file    Physical Sign of Abuse Present: Not on file    Review of Systems: See HPI, otherwise negative ROS  Physical Exam: Ht 5' 3 (1.6 m)   Wt 73.9 kg   BMI 28.86 kg/m  General:   Alert, cooperative in NAD Head:  Normocephalic and atraumatic. Respiratory:  Normal work of breathing. Cardiovascular:  RRR  Impression/Plan: Jackie Hunter is here for cataract surgery.  Risks, benefits, limitations, and alternatives regarding cataract surgery have been reviewed with the patient.  Questions have been answered.  All parties agreeable.   Feliciano Bryan Ober, MD  06/06/2024, 7:24 AM

## 2024-06-06 NOTE — Transfer of Care (Signed)
 Immediate Anesthesia Transfer of Care Note  Patient: Jackie Hunter  Procedure(s) Performed: PHACOEMULSIFICATION, CATARACT, WITH IOL INSERTION 11.34 01:01.3 (Left: Eye)  Patient Location: PACU  Anesthesia Type: MAC  Level of Consciousness: awake, alert  and patient cooperative  Airway and Oxygen Therapy: Patient Spontanous Breathing   Post-op Assessment: Post-op Vital signs reviewed, Patient's Cardiovascular Status Stable, Respiratory Function Stable, Patent Airway and No signs of Nausea or vomiting  Post-op Vital Signs: Reviewed and stable  Complications: No notable events documented.

## 2024-06-06 NOTE — Op Note (Signed)
 OPERATIVE NOTE  KENTRELL GUETTLER [id number] 06/06/2024   PREOPERATIVE DIAGNOSIS: Nuclear sclerotic cataract left eye. H25.12   POSTOPERATIVE DIAGNOSIS: Nuclear sclerotic cataract left eye. H25.12   PROCEDURE:  Phacoemulsification with posterior chamber intraocular lens placement of the left eye  Ultrasound time: Procedure(s): PHACOEMULSIFICATION, CATARACT, WITH IOL INSERTION 11.34 01:01.3 (Left)  LENS:   Implant Name Type Inv. Item Serial No. Manufacturer Lot No. LRB No. Used Action  LENS IOL CLRN 22.0 - [id number] Intraocular Lens LENS IOL CLRN 22.0 [id number] [facility]  Left 1 Implanted and Explanted  LENS IOL CLRN 22.0 - [id number] Intraocular Lens LENS IOL CLRN 22.0 [id number] [facility]  Left 1 Implanted      SURGEON:  Feliciano HERO. Enola, MD   ANESTHESIA:  Topical with tetracaine  drops, augmented with 1% preservative-free intracameral lidocaine .   COMPLICATIONS:  None.   DESCRIPTION OF PROCEDURE:  The patient was identified in the holding room and transported to the operating room and placed in the supine position under the operating microscope.  The left eye was identified as the operative eye, which was prepped and draped in the usual sterile ophthalmic fashion.   A 1 millimeter clear-corneal paracentesis was made inferotemporally. Preservative-free 1% lidocaine  mixed with 1:1,000 bisulfite-free aqueous solution of epinephrine  was injected into the anterior chamber. The anterior chamber was then filled with Viscoat viscoelastic. A 2.4 millimeter keratome was used to make a clear-corneal incision superotemporally. The red reflex was impaired due to dense corneal haze and diffuse cortical changes, so Trypan blue was injected to stain the anterior capsule to facilitate safe creation of the capsulorrhexis. A curvilinear capsulorrhexis was made with a cystotome and capsulorrhexis forceps. Balanced salt solution was used to hydrodissect and hydrodelineate the nucleus.  Phacoemulsification was then used to remove the lens nucleus and epinucleus. The remaining cortex was then removed using the irrigation and aspiration handpiece. Provisc was then placed into the capsular bag to distend it for lens placement. A +22.00 D [id number] intraocular lens was then injected into the capsular bag. Multiple scratches were noted in the intraocular lens implant so lens cutting scissors and lens grasping forceps were used to bisect the implant and the two halves were removed through the main incision. A new +22.00 D SY60WF intraocular lens was then injected into the capsular bag. No scratches were noted. The remaining viscoelastic was aspirated.   Wounds were hydrated with balanced salt solution.  The anterior chamber was inflated to a physiologic pressure with balanced salt solution.  No wound leaks were noted. Moxifloxacin was injected intracamerally.  Timolol and Brimonidine drops were applied to the eye.  The patient was taken to the recovery room in stable condition without complications of anesthesia or surgery.  [employer] 06/06/2024, 11:49 AM

## 2024-07-02 ENCOUNTER — Ambulatory Visit: Admitting: Dermatology

## 2024-07-08 ENCOUNTER — Emergency Department
Admission: EM | Admit: 2024-07-08 | Discharge: 2024-07-08 | Disposition: A | Discharge status: Home / Self Care | Attending: Emergency Medicine | Admitting: Emergency Medicine

## 2024-07-08 ENCOUNTER — Other Ambulatory Visit: Payer: Self-pay

## 2024-07-08 ENCOUNTER — Emergency Department

## 2024-07-08 DIAGNOSIS — K922 Gastrointestinal hemorrhage, unspecified: Secondary | ICD-10-CM | POA: Insufficient documentation

## 2024-07-08 DIAGNOSIS — K644 Residual hemorrhoidal skin tags: Secondary | ICD-10-CM

## 2024-07-08 LAB — CBC WITH DIFFERENTIAL/PLATELET
Abs Immature Granulocytes: 0.04 K/uL (ref 0.00–0.07)
Basophils Absolute: 0 K/uL (ref 0.0–0.1)
Basophils Relative: 1 %
Eosinophils Absolute: 0.3 K/uL (ref 0.0–0.5)
Eosinophils Relative: 4 %
HCT: 33.7 % — ABNORMAL LOW (ref 36.0–46.0)
Hemoglobin: 11.2 g/dL — ABNORMAL LOW (ref 12.0–15.0)
Immature Granulocytes: 1 %
Lymphocytes Relative: 21 %
Lymphs Abs: 1.5 K/uL (ref 0.7–4.0)
MCH: 31 pg (ref 26.0–34.0)
MCHC: 33.2 g/dL (ref 30.0–36.0)
MCV: 93.4 fL (ref 80.0–100.0)
Monocytes Absolute: 0.5 K/uL (ref 0.1–1.0)
Monocytes Relative: 7 %
Neutro Abs: 4.8 K/uL (ref 1.7–7.7)
Neutrophils Relative %: 66 %
Platelets: 334 K/uL (ref 150–400)
RBC: 3.61 MIL/uL — ABNORMAL LOW (ref 3.87–5.11)
RDW: 15.5 % (ref 11.5–15.5)
WBC: 7.2 K/uL (ref 4.0–10.5)
nRBC: 0 % (ref 0.0–0.2)

## 2024-07-08 LAB — TYPE AND SCREEN
ABO/RH(D): O POS
Antibody Screen: NEGATIVE

## 2024-07-08 LAB — COMPREHENSIVE METABOLIC PANEL WITH GFR
ALT: 13 U/L (ref 0–44)
AST: 17 U/L (ref 15–41)
Albumin: 3.8 g/dL (ref 3.5–5.0)
Alkaline Phosphatase: 50 U/L (ref 38–126)
Anion gap: 10 (ref 5–15)
BUN: 27 mg/dL — ABNORMAL HIGH (ref 8–23)
CO2: 23 mmol/L (ref 22–32)
Calcium: 8.4 mg/dL — ABNORMAL LOW (ref 8.9–10.3)
Chloride: 105 mmol/L (ref 98–111)
Creatinine, Ser: 1.06 mg/dL — ABNORMAL HIGH (ref 0.44–1.00)
GFR, Estimated: 53 mL/min — ABNORMAL LOW
Glucose, Bld: 106 mg/dL — ABNORMAL HIGH (ref 70–99)
Potassium: 4 mmol/L (ref 3.5–5.1)
Sodium: 137 mmol/L (ref 135–145)
Total Bilirubin: 0.3 mg/dL (ref 0.0–1.2)
Total Protein: 6.3 g/dL — ABNORMAL LOW (ref 6.5–8.1)

## 2024-07-08 MED ORDER — IOHEXOL 350 MG/ML SOLN
100.0000 mL | Freq: Once | INTRAVENOUS | Status: AC | PRN
  Administered 2024-07-08: 100 mL via INTRAVENOUS

## 2024-07-08 NOTE — ED Notes (Addendum)
 This RN helped at to the bathroom. Pt had black, reddish, jelly bowel movement. MD Jossie notify

## 2024-07-08 NOTE — ED Triage Notes (Signed)
 Pt came in via EMS from home due to noticing bloody stools this morning. Pt has history of hemorrhoids and cholecystitis 4 years ago. Pt believe blood was coming from hemorrhoids but notice a purple color when wiping. Pt A&O x4. Pt denies any pain at this time

## 2024-07-08 NOTE — ED Provider Notes (Signed)
 "  [facility] Provider Note   Event Date/Time   First MD Initiated Contact with Patient 07/08/24 [phone]     (approximate) History  Rectal Bleeding  HPI Jackie Hunter is a 78 y.o. female with a stated past medical history of diverticulosis and external/internal hemorrhoids who presents complaining of rectal bleeding via EMS.  Patient states that she had a bowel movement this morning and after wiping had a pain with bright red blood on the toilet paper.  Patient then states that she had purple blood upon wiping again and was concerned as the last time this happened she had to undergo surgery for colitis.  Patient denies any abdominal pain.  Patient does endorse rectal pain that she states is similar to external hemorrhoids that she has had in the past. ROS: Patient currently denies any vision changes, tinnitus, difficulty speaking, facial droop, sore throat, chest pain, shortness of breath, abdominal pain, nausea/vomiting/diarrhea, dysuria, or weakness/numbness/paresthesias in any extremity   Physical Exam  Triage Vital Signs: ED Triage Vitals  Encounter Vitals Group     BP 07/08/24 0908 114/82     Girls Systolic BP Percentile --      Girls Diastolic BP Percentile --      Boys Systolic BP Percentile --      Boys Diastolic BP Percentile --      Pulse Rate 07/08/24 0908 75     Resp 07/08/24 0908 16     Temp 07/08/24 0908 97.7 F (36.5 C)     Temp Source 07/08/24 0908 Oral     SpO2 07/08/24 0908 100 %     Weight --      Height --      Head Circumference --      Peak Flow --      Pain Score 07/08/24 0904 0     Pain Loc --      Pain Education --      Exclude from Growth Chart --    Most recent vital signs: Vitals:   07/08/24 0908  BP: 114/82  Pulse: 75  Resp: 16  Temp: 97.7 F (36.5 C)  SpO2: 100%   General: Awake, oriented x4. CV:  Good peripheral perfusion. Resp:  Normal effort. Abd:  No distention. Rectal:  Bleeding external  hemorrhoids Other:  Elderly overweight Hispanic female resting comfortably in no acute distress ED Results / Procedures / Treatments  Labs (all labs ordered are listed, but only abnormal results are displayed) Labs Reviewed - No data to display PROCEDURES: Critical Care performed: No Procedures MEDICATIONS ORDERED IN ED: Medications - No data to display IMPRESSION / MDM / ASSESSMENT AND PLAN / ED COURSE  I reviewed the triage vital signs and the nursing notes.                             The patient is on the cardiac monitor to evaluate for evidence of arrhythmia and/or significant heart rate changes. Patient's presentation is most consistent with acute presentation with potential threat to life or bodily function. Patient is an 78 year old female with the above-stated past medical history who presents via EMS after rectal bleeding this morning with painful rectum DDx: Diverticulitis, diverticulosis, bleeding external hemorrhoid, bleeding internal hemorrhoid Plan: CBC, CMP, type and screen  Laboratory radiologic evaluation does not show any evidence of acute bleeding or other infectious causes of patient's acute lower GI bleeding.  Patient is hemodynamically stable at this  time and has no significant bright red blood per rectum on exam.  Patient is stable at this time and agrees with plan for discharge.  Patient given strict return precautions and all questions answered prior to discharge  Dispo: Discharge home with PCP follow-up   FINAL CLINICAL IMPRESSION(S) / ED DIAGNOSES   Final diagnoses:  None   Rx / DC Orders   ED Discharge Orders     None      Note:  This document was prepared using Dragon voice recognition software and may include unintentional dictation errors.   Jossie Artist POUR, MD 07/09/24 [phone]  "

## 2024-07-08 NOTE — ED Notes (Addendum)
 MD Bradler at bed side to look at bowel movement

## 2024-07-11 ENCOUNTER — Other Ambulatory Visit: Payer: Self-pay

## 2024-07-11 ENCOUNTER — Observation Stay
Admission: EM | Admit: 2024-07-11 | Discharge: 2024-07-14 | Disposition: A | Attending: Emergency Medicine | Admitting: Emergency Medicine

## 2024-07-11 DIAGNOSIS — F1090 Alcohol use, unspecified, uncomplicated: Secondary | ICD-10-CM | POA: Insufficient documentation

## 2024-07-11 DIAGNOSIS — Z6826 Body mass index (BMI) 26.0-26.9, adult: Secondary | ICD-10-CM | POA: Insufficient documentation

## 2024-07-11 DIAGNOSIS — D519 Vitamin B12 deficiency anemia, unspecified: Secondary | ICD-10-CM | POA: Insufficient documentation

## 2024-07-11 DIAGNOSIS — I129 Hypertensive chronic kidney disease with stage 1 through stage 4 chronic kidney disease, or unspecified chronic kidney disease: Secondary | ICD-10-CM | POA: Insufficient documentation

## 2024-07-11 DIAGNOSIS — E663 Overweight: Secondary | ICD-10-CM | POA: Insufficient documentation

## 2024-07-11 DIAGNOSIS — N3281 Overactive bladder: Secondary | ICD-10-CM

## 2024-07-11 DIAGNOSIS — J45909 Unspecified asthma, uncomplicated: Secondary | ICD-10-CM | POA: Diagnosis not present

## 2024-07-11 DIAGNOSIS — K625 Hemorrhage of anus and rectum: Secondary | ICD-10-CM | POA: Diagnosis present

## 2024-07-11 DIAGNOSIS — Z79899 Other long term (current) drug therapy: Secondary | ICD-10-CM | POA: Insufficient documentation

## 2024-07-11 DIAGNOSIS — D649 Anemia, unspecified: Principal | ICD-10-CM | POA: Diagnosis present

## 2024-07-11 DIAGNOSIS — E538 Deficiency of other specified B group vitamins: Secondary | ICD-10-CM | POA: Diagnosis present

## 2024-07-11 DIAGNOSIS — E1122 Type 2 diabetes mellitus with diabetic chronic kidney disease: Secondary | ICD-10-CM | POA: Insufficient documentation

## 2024-07-11 DIAGNOSIS — N182 Chronic kidney disease, stage 2 (mild): Secondary | ICD-10-CM | POA: Diagnosis present

## 2024-07-11 DIAGNOSIS — K5750 Diverticulosis of both small and large intestine without perforation or abscess without bleeding: Secondary | ICD-10-CM | POA: Diagnosis not present

## 2024-07-11 DIAGNOSIS — D62 Acute posthemorrhagic anemia: Secondary | ICD-10-CM | POA: Diagnosis present

## 2024-07-11 DIAGNOSIS — I10 Essential (primary) hypertension: Secondary | ICD-10-CM | POA: Diagnosis present

## 2024-07-11 DIAGNOSIS — K922 Gastrointestinal hemorrhage, unspecified: Principal | ICD-10-CM

## 2024-07-11 LAB — COMPREHENSIVE METABOLIC PANEL WITH GFR
ALT: 10 U/L (ref 0–44)
AST: 16 U/L (ref 15–41)
Albumin: 3.5 g/dL (ref 3.5–5.0)
Alkaline Phosphatase: 41 U/L (ref 38–126)
Anion gap: 10 (ref 5–15)
BUN: 24 mg/dL — ABNORMAL HIGH (ref 8–23)
CO2: 21 mmol/L — ABNORMAL LOW (ref 22–32)
Calcium: 8.1 mg/dL — ABNORMAL LOW (ref 8.9–10.3)
Chloride: 106 mmol/L (ref 98–111)
Creatinine, Ser: 0.98 mg/dL (ref 0.44–1.00)
GFR, Estimated: 58 mL/min — ABNORMAL LOW
Glucose, Bld: 104 mg/dL — ABNORMAL HIGH (ref 70–99)
Potassium: 3.9 mmol/L (ref 3.5–5.1)
Sodium: 138 mmol/L (ref 135–145)
Total Bilirubin: 0.3 mg/dL (ref 0.0–1.2)
Total Protein: 5.7 g/dL — ABNORMAL LOW (ref 6.5–8.1)

## 2024-07-11 LAB — CBC WITH DIFFERENTIAL/PLATELET
Abs Immature Granulocytes: 0.05 K/uL (ref 0.00–0.07)
Basophils Absolute: 0.1 K/uL (ref 0.0–0.1)
Basophils Relative: 1 %
Eosinophils Absolute: 0.2 K/uL (ref 0.0–0.5)
Eosinophils Relative: 3 %
HCT: 24 % — ABNORMAL LOW (ref 36.0–46.0)
Hemoglobin: 7.8 g/dL — ABNORMAL LOW (ref 12.0–15.0)
Immature Granulocytes: 1 %
Lymphocytes Relative: 26 %
Lymphs Abs: 1.5 K/uL (ref 0.7–4.0)
MCH: 30.6 pg (ref 26.0–34.0)
MCHC: 32.5 g/dL (ref 30.0–36.0)
MCV: 94.1 fL (ref 80.0–100.0)
Monocytes Absolute: 0.4 K/uL (ref 0.1–1.0)
Monocytes Relative: 7 %
Neutro Abs: 3.6 K/uL (ref 1.7–7.7)
Neutrophils Relative %: 62 %
Platelets: 330 K/uL (ref 150–400)
RBC: 2.55 MIL/uL — ABNORMAL LOW (ref 3.87–5.11)
RDW: 15.9 % — ABNORMAL HIGH (ref 11.5–15.5)
WBC: 5.7 K/uL (ref 4.0–10.5)
nRBC: 0 % (ref 0.0–0.2)

## 2024-07-11 LAB — URINALYSIS, W/ REFLEX TO CULTURE (INFECTION SUSPECTED)
Bilirubin Urine: NEGATIVE
Glucose, UA: NEGATIVE mg/dL
Hgb urine dipstick: NEGATIVE
Ketones, ur: NEGATIVE mg/dL
Leukocytes,Ua: NEGATIVE
Nitrite: NEGATIVE
Protein, ur: NEGATIVE mg/dL
Specific Gravity, Urine: 1.009 (ref 1.005–1.030)
pH: 5 (ref 5.0–8.0)

## 2024-07-11 LAB — SARS CORONAVIRUS 2 BY RT PCR: SARS Coronavirus 2 by RT PCR: NEGATIVE

## 2024-07-11 LAB — LACTIC ACID, PLASMA: Lactic Acid, Venous: 1.2 mmol/L (ref 0.5–1.9)

## 2024-07-11 LAB — IRON AND TIBC
Iron: 40 ug/dL (ref 28–170)
Saturation Ratios: 14 % (ref 10.4–31.8)
TIBC: 297 ug/dL (ref 250–450)
UIBC: 257 ug/dL

## 2024-07-11 LAB — HEMOGLOBIN AND HEMATOCRIT, BLOOD
HCT: 28.3 % — ABNORMAL LOW (ref 36.0–46.0)
Hemoglobin: 9.4 g/dL — ABNORMAL LOW (ref 12.0–15.0)

## 2024-07-11 LAB — RETICULOCYTES
Immature Retic Fract: 22.9 % — ABNORMAL HIGH (ref 2.3–15.9)
RBC.: 2.69 MIL/uL — ABNORMAL LOW (ref 3.87–5.11)
Retic Count, Absolute: 122.7 K/uL (ref 19.0–186.0)
Retic Ct Pct: 4.6 % — ABNORMAL HIGH (ref 0.4–3.1)

## 2024-07-11 LAB — FERRITIN: Ferritin: 67 ng/mL (ref 11–307)

## 2024-07-11 LAB — LIPASE, BLOOD: Lipase: 43 U/L (ref 11–51)

## 2024-07-11 LAB — PREPARE RBC (CROSSMATCH)

## 2024-07-11 MED ORDER — PANTOPRAZOLE SODIUM 40 MG PO TBEC
40.0000 mg | DELAYED_RELEASE_TABLET | Freq: Every day | ORAL | Status: DC
  Administered 2024-07-12 – 2024-07-14 (×3): 40 mg via ORAL
  Filled 2024-07-11 (×3): qty 1

## 2024-07-11 MED ORDER — ALPRAZOLAM 0.25 MG PO TABS
0.2500 mg | ORAL_TABLET | Freq: Every evening | ORAL | Status: DC | PRN
  Administered 2024-07-12: 0.25 mg via ORAL
  Filled 2024-07-11: qty 1

## 2024-07-11 MED ORDER — BUPROPION HCL ER (XL) 150 MG PO TB24
300.0000 mg | ORAL_TABLET | Freq: Every day | ORAL | Status: DC
  Administered 2024-07-12 – 2024-07-14 (×3): 300 mg via ORAL
  Filled 2024-07-11 (×3): qty 2

## 2024-07-11 MED ORDER — SODIUM CHLORIDE 0.9% IV SOLUTION
Freq: Once | INTRAVENOUS | Status: DC
  Filled 2024-07-11: qty 250

## 2024-07-11 MED ORDER — DOCUSATE SODIUM 100 MG PO CAPS
100.0000 mg | ORAL_CAPSULE | Freq: Two times a day (BID) | ORAL | Status: DC
  Administered 2024-07-11 – 2024-07-14 (×6): 100 mg via ORAL
  Filled 2024-07-11 (×6): qty 1

## 2024-07-11 MED ORDER — ONDANSETRON HCL 4 MG/2ML IJ SOLN: 4.0000 mg | Freq: Four times a day (QID) | INTRAMUSCULAR | Status: DC | PRN

## 2024-07-11 MED ORDER — ACETAMINOPHEN 325 MG PO TABS: 650.0000 mg | ORAL_TABLET | Freq: Four times a day (QID) | ORAL | Status: DC | PRN

## 2024-07-11 MED ORDER — FUROSEMIDE 10 MG/ML IJ SOLN
20.0000 mg | Freq: Once | INTRAMUSCULAR | Status: AC
  Administered 2024-07-11: 20 mg via INTRAVENOUS
  Filled 2024-07-11: qty 2

## 2024-07-11 MED ORDER — LORATADINE 10 MG PO TABS
10.0000 mg | ORAL_TABLET | Freq: Every day | ORAL | Status: DC
  Administered 2024-07-12 – 2024-07-14 (×3): 10 mg via ORAL
  Filled 2024-07-11 (×3): qty 1

## 2024-07-11 MED ORDER — HYDRALAZINE HCL 20 MG/ML IJ SOLN: 5.0000 mg | Freq: Four times a day (QID) | INTRAMUSCULAR | Status: DC | PRN

## 2024-07-11 MED ORDER — MIRABEGRON ER 50 MG PO TB24
50.0000 mg | ORAL_TABLET | Freq: Every day | ORAL | Status: DC
  Administered 2024-07-12 – 2024-07-14 (×3): 50 mg via ORAL
  Filled 2024-07-11 (×3): qty 1

## 2024-07-11 MED ORDER — MINOXIDIL 2.5 MG PO TABS
2.5000 mg | ORAL_TABLET | Freq: Every day | ORAL | Status: DC
  Administered 2024-07-12 – 2024-07-14 (×3): 2.5 mg via ORAL
  Filled 2024-07-11 (×3): qty 1

## 2024-07-11 MED ORDER — DOCUSATE SODIUM 100 MG PO CAPS: 100.0000 mg | ORAL_CAPSULE | Freq: Two times a day (BID) | ORAL | 2 refills | Status: AC

## 2024-07-11 MED ORDER — AMLODIPINE BESYLATE 5 MG PO TABS
2.5000 mg | ORAL_TABLET | Freq: Every day | ORAL | Status: DC
  Administered 2024-07-12 – 2024-07-14 (×3): 2.5 mg via ORAL
  Filled 2024-07-11 (×3): qty 1

## 2024-07-11 MED ORDER — ONDANSETRON HCL 4 MG PO TABS: 4.0000 mg | ORAL_TABLET | Freq: Four times a day (QID) | ORAL | Status: DC | PRN

## 2024-07-11 MED ORDER — ACETAMINOPHEN 650 MG RE SUPP: 650.0000 mg | Freq: Four times a day (QID) | RECTAL | Status: DC | PRN

## 2024-07-11 MED ORDER — FERROUS SULFATE 325 (65 FE) MG PO TBEC
325.0000 mg | ORAL_TABLET | Freq: Every morning | ORAL | Status: DC
  Administered 2024-07-12: 325 mg via ORAL
  Filled 2024-07-11: qty 1

## 2024-07-11 NOTE — ED Notes (Signed)
 Pt ambulated to BR with standby assist. Tolerated well.

## 2024-07-11 NOTE — ED Notes (Signed)
 Hospitalist at bedside assessing patient.

## 2024-07-11 NOTE — ED Triage Notes (Signed)
 Pt BIB [facility] from [facility] C/O mid-abdominal pain, belching. Also reports dizziness and SOB when standing X 3 days.

## 2024-07-11 NOTE — H&P (Signed)
 " History and Physical    Jackie Hunter FMW:[id number] DOB: 04-05-1946 DOA: 07/11/2024  PCP: Sadie Manna, MD (Confirm with patient/family/NH records and if not entered, this has to be entered at [facility] point of entry) Patient coming from: Home  I have personally briefly reviewed patient's old medical records in [facility]  Chief Complaint: Rectal bleeding, SOB  HPI: Jackie Hunter is a 78 y.o. female with medical history significant of GI bleed secondary to diverticulosis, HTN, mild intermittent asthma, CKD stage IIIa, presented with multiple complaints including rectal bleeding and increasing shortness of breath.  Patient has chronic constipation she uses only herbal product for the problem.  Starting last Friday after 3 days of constipation started to pass bright red blood.  3-5 times a day for 3 days, the rectal bleeding color has turned darker and contained blood clots.  Last 2 days she has seen only intermittent blood clots and  very dark brown hard stool she also complained about intermittent left lower quadrant abdominal pain but denied any nausea vomiting or diarrhea no fever or chills.  Gradually she started to feel exertional dyspnea and last 2 days she developed significant shortness of breath even walking inside bedroom and last night she woke up for shortness of breath.  There was no cough no chest pain. ED Course: Afebrile, no tachycardia blood pressure 130/50 O2 saturation 100% room air.  Chest x-ray negative for acute findings.  Blood work showed hemoglobin 7.8 compared to baseline Moding 11 June this year WBC 5.7 BUN 24 creatinine 2.9.  Review of Systems: As per HPI otherwise 14 point review of systems negative.    Past Medical History:  Diagnosis Date   Adenoma of colon    Allergic rhinitis    Anemia    Anxiety    Arthritis    Asthma    years ago and spring time it bothers her   Back pain    Carpal tunnel syndrome    Carpal tunnel syndrome    Carpal  tunnel syndrome    Cataract cortical, senile    Cervicalgia    Chronic kidney disease    STAGE 3   CKD (chronic kidney disease)    DDD (degenerative disc disease), lumbar    Depression    Diabetes mellitus without complication (HCC)    does not take any medications   Diverticulosis    Gastritis    GERD (gastroesophageal reflux disease)    Headache    History of hiatal hernia    Hypertension    Lipids serum increased    Lumbago    Migraines    Obesity    Osteoarthritis    Stroke [facility])    TIA (transient ischemic attack)    Wrist fracture     Past Surgical History:  Procedure Laterality Date   BREAST BIOPSY Right 2014   NEG   BREAST BIOPSY Left 11/24/2016   us  core, radial scar    BREAST EXCISIONAL BIOPSY Left 1967   NEG   CATARACT EXTRACTION W/PHACO Left 06/06/2024   Procedure: PHACOEMULSIFICATION, CATARACT, WITH IOL INSERTION 11.34 01:01.3;  Surgeon: Enola Feliciano Hugger, MD;  Location: [facility];  Service: Ophthalmology;  Laterality: Left;   cataracts Bilateral    COLONOSCOPY N/A 10/30/2023   Procedure: COLONOSCOPY;  Surgeon: Onita Elspeth Sharper, DO;  Location: [facility] ENDOSCOPY;  Service: Gastroenterology;  Laterality: N/A;   COLONOSCOPY N/A 01/08/2024   Procedure: COLONOSCOPY;  Surgeon: Onita Elspeth Sharper, DO;  Location: [facility] ENDOSCOPY;  Service: Gastroenterology;  Laterality: N/A;   COLONOSCOPY WITH PROPOFOL  N/A 05/03/2016   Procedure: COLONOSCOPY WITH PROPOFOL ;  Surgeon: Gladis RAYMOND Mariner, MD;  Location: [facility] ENDOSCOPY;  Service: Endoscopy;  Laterality: N/A;   COLONOSCOPY WITH PROPOFOL  N/A 11/01/2016   Procedure: COLONOSCOPY WITH PROPOFOL ;  Surgeon: Gladis RAYMOND Mariner, MD;  Location: [facility] ENDOSCOPY;  Service: Endoscopy;  Laterality: N/A;   COLONOSCOPY WITH PROPOFOL  N/A 11/02/2016   Procedure: COLONOSCOPY WITH PROPOFOL ;  Surgeon: Gladis RAYMOND Mariner, MD;  Location: [facility] ENDOSCOPY;  Service: Endoscopy;  Laterality: N/A;   COLONOSCOPY WITH PROPOFOL  N/A  12/01/2017   Procedure: COLONOSCOPY WITH PROPOFOL ;  Surgeon: Mariner Gladis RAYMOND, MD;  Location: [facility] ENDOSCOPY;  Service: Endoscopy;  Laterality: N/A;   COLONOSCOPY WITH PROPOFOL  N/A 03/02/2018   Procedure: COLONOSCOPY WITH PROPOFOL ;  Surgeon: Mariner Gladis RAYMOND, MD;  Location: [facility] ENDOSCOPY;  Service: Endoscopy;  Laterality: N/A;   COLONOSCOPY WITH PROPOFOL  N/A 12/28/2020   Procedure: COLONOSCOPY WITH PROPOFOL ;  Surgeon: Unk Corinn Skiff, MD;  Location: [facility] ENDOSCOPY;  Service: Gastroenterology;  Laterality: N/A;   ESOPHAGOGASTRODUODENOSCOPY N/A 01/08/2024   Procedure: EGD (ESOPHAGOGASTRODUODENOSCOPY);  Surgeon: Onita Elspeth Sharper, DO;  Location: [facility];  Service: Gastroenterology;  Laterality: N/A;   ESOPHAGOGASTRODUODENOSCOPY (EGD) WITH PROPOFOL  N/A 10/26/2015   Procedure: ESOPHAGOGASTRODUODENOSCOPY (EGD) WITH PROPOFOL ;  Surgeon: Deward CINDERELLA Piedmont, MD;  Location: [facility] ENDOSCOPY;  Service: Gastroenterology;  Laterality: N/A;   ESOPHAGOGASTRODUODENOSCOPY (EGD) WITH PROPOFOL  N/A 02/12/2019   Procedure: ESOPHAGOGASTRODUODENOSCOPY (EGD) WITH PROPOFOL ;  Surgeon: Mariner Gladis RAYMOND, MD;  Location: [facility] ENDOSCOPY;  Service: Endoscopy;  Laterality: N/A;   EYE SURGERY     cataract surgery   LIPOMA EXCISION Right 02/17/2022   Procedure: EXCISION LIPOMA;  Surgeon: Tye Millet, DO;  Location: [facility] ORS;  Service: General;  Laterality: Right;   OOPHORECTOMY     left   POLYPECTOMY  01/08/2024   Procedure: POLYPECTOMY, INTESTINE;  Surgeon: Onita Elspeth Sharper, DO;  Location: [facility] ENDOSCOPY;  Service: Gastroenterology;;   ROBLEY INJECTION  10/30/2023   Procedure: INJECTION, SUBMUCOSAL;  Surgeon: Onita Elspeth Sharper, DO;  Location: [facility] ENDOSCOPY;  Service: Gastroenterology;;     reports that she has never smoked. She has never been exposed to tobacco smoke. She has never used smokeless tobacco. She reports current alcohol use. She reports that she does not use drugs.  Allergies[1]  Family  History  Problem Relation Age of Onset   Leukemia Mother    Arthritis Mother    Leukemia Nephew    Kidney cancer Neg Hx    Prostate cancer Neg Hx    Breast cancer Neg Hx      Prior to Admission medications  Medication Sig Start Date End Date Taking? Authorizing Provider  docusate sodium  (COLACE) 100 MG capsule Take 1 capsule (100 mg total) by mouth 2 (two) times daily. 07/11/24 07/11/25 Yes Laurita Cort DASEN, MD  acetaminophen  (TYLENOL ) 325 MG tablet Take 2 tablets (650 mg total) by mouth every 6 (six) hours as needed for mild pain, fever or headache. 12/31/20   Elgergawy, Brayton RAMAN, MD  ALPRAZolam  (XANAX ) 0.25 MG tablet Take 0.25 mg by mouth at bedtime as needed for anxiety.    [provider]  amLODipine  (NORVASC ) 2.5 MG tablet Take 2.5 mg by mouth daily.    [provider]  Azelaic Acid  15 % gel Apply QAM to face, wash off at bedtime 01/04/23   Jackquline Sawyer, MD  buPROPion  (WELLBUTRIN  XL) 300 MG 24 hr tablet Take 300 mg by mouth daily. 03/10/21  [provider]  ferrous sulfate  325 (65 FE) MG EC tablet Take 1 tablet by mouth every morning.    [provider]  fexofenadine (ALLEGRA) 60 MG tablet Take 60 mg by mouth as needed.    [provider]  minoxidil  (LONITEN ) 2.5 MG tablet Take 1 tablet (2.5 mg total) by mouth daily. 01/04/23   Jackquline Sawyer, MD  mirabegron  ER (MYRBETRIQ ) 50 MG TB24 tablet Take 1 tablet (50 mg total) by mouth daily. 04/16/24   Vaillancourt, Samantha, PA-C  Multiple Vitamins-Minerals (VITA HAIR) TABS Take 1 tablet by mouth daily. HAIR, SKIN AND NAILS    [provider]  pantoprazole  (PROTONIX ) 40 MG tablet Take 1 tablet (40 mg total) by mouth daily. 12/31/20   Elgergawy, Brayton RAMAN, MD  vitamin B-12 (CYANOCOBALAMIN ) 1000 MCG tablet Take 1,000 mcg by mouth daily.    [provider]  vitamin C (ASCORBIC ACID) 500 MG tablet Take 500 mg by mouth daily.    [provider]    Physical Exam: Vitals:   07/11/24  1100 07/11/24 1130 07/11/24 1200 07/11/24 1244  BP: (!) 124/54 (!) 125/57 (!) 121/59 (!) 131/55  Pulse: 91 68 73 66  Resp: 20 17 15 18   Temp:   98.1 F (36.7 C) 97.8 F (36.6 C)  TempSrc:   Oral Oral  SpO2: 99% 100% 100% 100%  Weight:    69.1 kg  Height:    5' 3 (1.6 m)    Constitutional: NAD, calm, comfortable Vitals:   07/11/24 1100 07/11/24 1130 07/11/24 1200 07/11/24 1244  BP: (!) 124/54 (!) 125/57 (!) 121/59 (!) 131/55  Pulse: 91 68 73 66  Resp: 20 17 15 18   Temp:   98.1 F (36.7 C) 97.8 F (36.6 C)  TempSrc:   Oral Oral  SpO2: 99% 100% 100% 100%  Weight:    69.1 kg  Height:    5' 3 (1.6 m)   Eyes: PERRL, lids and conjunctivae normal ENMT: Mucous membranes are moist. Posterior pharynx clear of any exudate or lesions.Normal dentition.  Neck: normal, supple, no masses, no thyromegaly Respiratory: clear to auscultation bilaterally, no wheezing, no crackles. Normal respiratory effort. No accessory muscle use.  Cardiovascular: Regular rate and rhythm, no murmurs / rubs / gallops. No extremity edema. 2+ pedal pulses. No carotid bruits.  Abdomen: no tenderness, no masses palpated. No hepatosplenomegaly. Bowel sounds positive.  Musculoskeletal: no clubbing / cyanosis. No joint deformity upper and lower extremities. Good ROM, no contractures. Normal muscle tone.  Skin: no rashes, lesions, ulcers. No induration Neurologic: CN 2-12 grossly intact. Sensation intact, DTR normal. Strength 5/5 in all 4.  Psychiatric: Normal judgment and insight. Alert and oriented x 3. Normal mood.     Labs on Admission: I have personally reviewed following labs and imaging studies  CBC: Recent Labs  Lab 07/08/24 0915 07/11/24 0949  WBC 7.2 5.7  NEUTROABS 4.8 3.6  HGB 11.2* 7.8*  HCT 33.7* 24.0*  MCV 93.4 94.1  PLT 334 330   Basic Metabolic Panel: Recent Labs  Lab 07/08/24 0915 07/11/24 0949  NA 137 138  K 4.0 3.9  CL 105 106  CO2 23 21*  GLUCOSE 106* 104*  BUN 27* 24*   CREATININE 1.06* 0.98  CALCIUM  8.4* 8.1*   GFR: Estimated Creatinine Clearance: 42.7 mL/min (by C-G formula based on SCr of 0.98 mg/dL). Liver Function Tests: Recent Labs  Lab 07/08/24 0915 07/11/24 0949  AST 17 16  ALT 13 10  ALKPHOS 50 41  BILITOT 0.3 0.3  PROT 6.3* 5.7*  ALBUMIN 3.8 3.5   Recent Labs  Lab 07/11/24 0949  LIPASE 43   No results for input(s): AMMONIA in the last 168 hours. Coagulation Profile: No results for input(s): INR, PROTIME in the last 168 hours. Cardiac Enzymes: No results for input(s): CKTOTAL, CKMB, CKMBINDEX, TROPONINI in the last 168 hours. BNP (last 3 results) No results for input(s): PROBNP in the last 8760 hours. HbA1C: No results for input(s): HGBA1C in the last 72 hours. CBG: No results for input(s): GLUCAP in the last 168 hours. Lipid Profile: No results for input(s): CHOL, HDL, LDLCALC, TRIG, CHOLHDL, LDLDIRECT in the last 72 hours. Thyroid  Function Tests: No results for input(s): TSH, T4TOTAL, FREET4, T3FREE, THYROIDAB in the last 72 hours. Anemia Panel: No results for input(s): VITAMINB12, FOLATE, FERRITIN, TIBC, IRON , RETICCTPCT in the last 72 hours. Urine analysis:    Component Value Date/Time   COLORURINE STRAW (A) 07/11/2024 1100   APPEARANCEUR CLEAR (A) 07/11/2024 1100   APPEARANCEUR Clear 07/20/2023 1052   LABSPEC 1.009 07/11/2024 1100   PHURINE 5.0 07/11/2024 1100   GLUCOSEU NEGATIVE 07/11/2024 1100   HGBUR NEGATIVE 07/11/2024 1100   BILIRUBINUR NEGATIVE 07/11/2024 1100   BILIRUBINUR Negative 07/20/2023 1052   KETONESUR NEGATIVE 07/11/2024 1100   PROTEINUR NEGATIVE 07/11/2024 1100   NITRITE NEGATIVE 07/11/2024 1100   LEUKOCYTESUR NEGATIVE 07/11/2024 1100    Radiological Exams on Admission: No results found.  EKG: Independently reviewed.  Sinus rhythm, no acute ST changes. Assessment/Plan Principal Problem:   Symptomatic anemia Active Problems:   Acute  blood loss anemia  (please populate well all problems here in Problem List. (For example, if patient is on BP meds at home and you resume or decide to hold them, it is a problem that needs to be her. Same for CAD, COPD, HLD and so on)  Symptomatic anemia Probably acute HFpEF decompensation - Given the severity of her anemia, will give PRBC x 1 -Resume iron  supplement - No significant fluid overload on physical exam or imaging study, hold off diuresis. - Outpatient echocardiogram  Recurrent hematochezia - Likely diverticulosis bleeding.  CTA negative for acute bleeding today. - Educate patient about avoiding constipation in the future.  Colace prescription sent to [facility] as per patient's request. - Other DDx, recent EGD and colonoscopy results from June this year reviewed.  Normal EGD study, colonoscopy showed diverticulosis and internal hemorrhoids.  HTN - Resume home BP meds tomorrow - As needed hydralazine  for today  DVT prophylaxis: SCD Code Status: Full code Family Communication: None at bedside Disposition Plan: Expect less than 2 midnight hospital stay Consults called: None Admission status: Telemetry observation   Cort ONEIDA Mana MD [facility] Pager [phone]  07/11/2024, 1:01 PM       [1]  Allergies Allergen Reactions   Levaquin [Levofloxacin In D5w] Other (See Comments)    dizziness   Levofloxacin     Other reaction(s): Dizziness dizziness   Meloxicam     Other reaction(s): Other (See Comments) GI upset   Codeine Anxiety and Other (See Comments)    Other reaction(s): Unknown (See Comments) Increased heart rate  Difficulty breathing; arrythmia   Sucralfate  Nausea Only   "

## 2024-07-11 NOTE — Plan of Care (Signed)

## 2024-07-11 NOTE — ED Provider Notes (Signed)
 "  [facility] Provider Note   Event Date/Time   First MD Initiated Contact with Patient 07/11/24 [phone]     (approximate) History  Abdominal Pain  HPI Jackie Hunter is a 78 y.o. female with a past medical history of CKD, diverticulosis, type 2 diabetes who presents complaining of worsening generalized abdominal pain with bright red blood per rectum that been present over the last 5 days.  Patient states that she has now developed dizziness and shortness of breath with standing.  Patient denies any exacerbating or relieving factors for her abdominal pain. ROS: Patient currently denies any vision changes, tinnitus, difficulty speaking, facial droop, sore throat, chest pain, shortness of breath, nausea/vomiting/diarrhea, dysuria, or weakness/numbness/paresthesias in any extremity   Physical Exam  Triage Vital Signs: ED Triage Vitals  Encounter Vitals Group     BP 07/11/24 0923 116/68     Girls Systolic BP Percentile --      Girls Diastolic BP Percentile --      Boys Systolic BP Percentile --      Boys Diastolic BP Percentile --      Pulse Rate 07/11/24 0923 74     Resp 07/11/24 0923 14     Temp 07/11/24 0919 98.1 F (36.7 C)     Temp Source 07/11/24 0919 Oral     SpO2 07/11/24 0920 100 %     Weight 07/11/24 0921 155 lb (70.3 kg)     Height 07/11/24 0921 5' 3 (1.6 m)     Head Circumference --      Peak Flow --      Pain Score 07/11/24 0920 7     Pain Loc --      Pain Education --      Exclude from Growth Chart --    Most recent vital signs: Vitals:   07/11/24 1030 07/11/24 1100  BP: (!) 144/95 (!) 124/54  Pulse: 77 91  Resp: (!) 26 20  Temp:    SpO2: 100% 99%   General: Awake, oriented x4. CV:  Good peripheral perfusion. Resp:  Normal effort. Abd:  No distention.  Nontender to palpation Other:  Elderly overweight [place] female resting comfortably in no acute distress ED Results / Procedures / Treatments  Labs (all labs ordered are listed,  but only abnormal results are displayed) Labs Reviewed  COMPREHENSIVE METABOLIC PANEL WITH GFR - Abnormal; Notable for the following components:      Result Value   CO2 21 (*)    Glucose, Bld 104 (*)    BUN 24 (*)    Calcium  8.1 (*)    Total Protein 5.7 (*)    GFR, Estimated 58 (*)    All other components within normal limits  CBC WITH DIFFERENTIAL/PLATELET - Abnormal; Notable for the following components:   RBC 2.55 (*)    Hemoglobin 7.8 (*)    HCT 24.0 (*)    RDW 15.9 (*)    All other components within normal limits  URINALYSIS, W/ REFLEX TO CULTURE (INFECTION SUSPECTED) - Abnormal; Notable for the following components:   Color, Urine STRAW (*)    APPearance CLEAR (*)    Bacteria, UA RARE (*)    All other components within normal limits  LIPASE, BLOOD  LACTIC ACID, PLASMA  HEMOGLOBIN AND HEMATOCRIT, BLOOD  HEMOGLOBIN AND HEMATOCRIT, BLOOD  TYPE AND SCREEN   PROCEDURES: Critical Care performed: Yes, see critical care procedure note(s) CRITICAL CARE Performed by: Artist MARLA Kerns   Total critical care  time: 35 minutes  Critical care time was exclusive of separately billable procedures and treating other patients.  Critical care was necessary to treat or prevent imminent or life-threatening deterioration.  Critical care was time spent personally by me on the following activities: development of treatment plan with patient and/or surrogate as well as nursing, discussions with consultants, evaluation of patient's response to treatment, examination of patient, obtaining history from patient or surrogate, ordering and performing treatments and interventions, ordering and review of laboratory studies, ordering and review of radiographic studies, pulse oximetry and re-evaluation of patient's condition.  Procedures MEDICATIONS ORDERED IN ED: Medications - No data to display IMPRESSION / MDM / ASSESSMENT AND PLAN / ED COURSE  I reviewed the triage vital signs and the nursing  notes.                             The patient is on the cardiac monitor to evaluate for evidence of arrhythmia and/or significant heart rate changes. Patient's presentation is most consistent with acute presentation with potential threat to life or bodily function. Patient is an 78 year old female with the above-stated past medical history presents for abdominal pain and GI bleeding is been present over the last week with shortness of breath and dyspnea on exertion is been present over the last 3 days DDx: Symptomatic anemia, lower GI bleeding, upper GI bleeding, DIC, CHF exacerbation Plan: CBC, CMP, lactic acid, lipase, UA, type and screen  Patient shows signs of symptomatic anemia given a precipitous drop in her hemoglobin from 11.2-7.8 over the last 3 days.  Patient recently had a CT angiography 3 days prior to arrival when she presented for similar symptoms.  Given the significant drop, she will require admission to the internal medicine service for further evaluation and management.  Dispo: Admit to medicine   FINAL CLINICAL IMPRESSION(S) / ED DIAGNOSES   Final diagnoses:  Acute GI bleeding  Symptomatic anemia   Rx / DC Orders   ED Discharge Orders     None      Note:  This document was prepared using Dragon voice recognition software and may include unintentional dictation errors.   Aeliana Spates K, MD 07/11/24 [phone]  "

## 2024-07-12 DIAGNOSIS — D649 Anemia, unspecified: Secondary | ICD-10-CM | POA: Diagnosis not present

## 2024-07-12 DIAGNOSIS — E538 Deficiency of other specified B group vitamins: Secondary | ICD-10-CM

## 2024-07-12 DIAGNOSIS — D62 Acute posthemorrhagic anemia: Secondary | ICD-10-CM | POA: Diagnosis not present

## 2024-07-12 LAB — CBC
HCT: 31.3 % — ABNORMAL LOW (ref 36.0–46.0)
Hemoglobin: 10.3 g/dL — ABNORMAL LOW (ref 12.0–15.0)
MCH: 28.3 pg (ref 26.0–34.0)
MCHC: 32.9 g/dL (ref 30.0–36.0)
MCV: 86 fL (ref 80.0–100.0)
Platelets: 370 K/uL (ref 150–400)
RBC: 3.64 MIL/uL — ABNORMAL LOW (ref 3.87–5.11)
RDW: 22.9 % — ABNORMAL HIGH (ref 11.5–15.5)
WBC: 8.5 K/uL (ref 4.0–10.5)
nRBC: 0 % (ref 0.0–0.2)

## 2024-07-12 LAB — TYPE AND SCREEN
ABO/RH(D): O POS
Antibody Screen: NEGATIVE
Unit division: 0

## 2024-07-12 LAB — BPAM RBC
Blood Product Expiration Date: 202601242359
ISSUE DATE / TIME: 202512251308
Unit Type and Rh: 5100

## 2024-07-12 LAB — HEMOGLOBIN
Hemoglobin: 9.3 g/dL — ABNORMAL LOW (ref 12.0–15.0)
Hemoglobin: 8.7 g/dL — ABNORMAL LOW (ref 12.0–15.0)

## 2024-07-12 MED ORDER — ALUM & MAG HYDROXIDE-SIMETH 200-200-20 MG/5ML PO SUSP: 30.0000 mL | Freq: Four times a day (QID) | ORAL | Status: DC | PRN

## 2024-07-12 MED ORDER — ENSURE PLUS HIGH PROTEIN PO LIQD
237.0000 mL | Freq: Two times a day (BID) | ORAL | Status: DC
  Administered 2024-07-12 – 2024-07-14 (×5): 237 mL via ORAL

## 2024-07-12 MED ORDER — POLYETHYLENE GLYCOL 3350 17 G PO PACK
17.0000 g | PACK | Freq: Two times a day (BID) | ORAL | Status: DC
  Administered 2024-07-13 – 2024-07-14 (×2): 17 g via ORAL
  Filled 2024-07-12 (×3): qty 1

## 2024-07-12 MED ORDER — LACTULOSE 10 GM/15ML PO SOLN
20.0000 g | Freq: Once | ORAL | Status: AC
  Administered 2024-07-12: 20 g via ORAL
  Filled 2024-07-12: qty 30

## 2024-07-12 MED ORDER — PSYLLIUM 95 % PO PACK
1.0000 | PACK | Freq: Every day | ORAL | Status: DC
  Administered 2024-07-12 – 2024-07-14 (×3): 1 via ORAL
  Filled 2024-07-12 (×3): qty 1

## 2024-07-12 MED ORDER — VITAMIN B-12 1000 MCG PO TABS
1000.0000 ug | ORAL_TABLET | Freq: Every day | ORAL | Status: DC
  Administered 2024-07-12 – 2024-07-14 (×3): 1000 ug via ORAL
  Filled 2024-07-12 (×3): qty 1

## 2024-07-12 NOTE — TOC CM/SW Note (Signed)
 Transition of Care [facility]) - Inpatient Brief Assessment   Patient Details  Name: Jackie Hunter MRN: [id number] Date of Birth: 1946-04-10  [facility] of [facility]) CM/SW Contact:    Daved JONETTA Hamilton, RN Phone Number: 07/12/2024, 9:07 AM   Clinical Narrative:   Transition of Care [facility]) Screening Note   Patient Details  Name: Jackie Hunter Date of Birth: 1946/05/07   Transition of Care [facility]) CM/SW Contact:    Daved JONETTA Hamilton, RN Phone Number: 07/12/2024, 9:08 AM    Transition of Care Department [facility]) has reviewed patient and no TOC needs have been identified at this time. If new patient transition needs arise, please place a TOC consult.    Transition of Care Asessment: Insurance and Status: Insurance coverage has been reviewed Patient has primary care physician: Yes   Prior level of function:: Independent Prior/Current Home Services: No current home services Social Drivers of Health Review: SDOH reviewed no interventions necessary Readmission risk has been reviewed: No (Patient is in Observation status, no score generated) Transition of care needs: no transition of care needs at this time

## 2024-07-12 NOTE — Care Management Obs Status (Signed)
 MEDICARE OBSERVATION STATUS NOTIFICATION   Patient Details  Name: Jackie Hunter MRN: [id number] Date of Birth: February 06, 1946   Medicare Observation Status Notification Given:  Chaney BRANDY CHRISTIANE LELON, CMA 07/12/2024, 3:02 PM

## 2024-07-12 NOTE — Plan of Care (Signed)

## 2024-07-12 NOTE — Plan of Care (Signed)
  Problem: Clinical Measurements: Goal: Ability to maintain clinical measurements within normal limits will improve Outcome: Progressing   Problem: Safety: Goal: Ability to remain free from injury will improve Outcome: Progressing   

## 2024-07-12 NOTE — Consult Note (Signed)
 " Consultation  Referring Provider:   Hospitalist   Admit date: 07/11/2024 Consult date        07/12/2024 Reason for Consultation:     GI bleed         HPI:   Jackie Hunter is a 78 y.o. lady with history of recurrent diverticular bleeding, CKD 3, and arthritis who is here with what was initially hematochezia and now is more blood clots and dark stool. Initially started on Monday and has continued. She is having 2-3 dark bowel movements daily now. She notes significant constipation prior to this episode. On her previous colonoscopies she had a very large diverticulum in the ascending colon with previous evidence of bleeding. Her last EGD/Colonoscopy was only a few months ago and was unremarkable besides considerable diverticulosis. She takes occasional NSAIDS. She notes severe constipation for which she is only taking a tea mixture. She takes iron  daily. Iron  studies normal but suspect this was drawn after blood transfusion.  Past Medical History:  Diagnosis Date   Adenoma of colon    Allergic rhinitis    Anemia    Anxiety    Arthritis    Asthma    years ago and spring time it bothers her   Back pain    Carpal tunnel syndrome    Carpal tunnel syndrome    Carpal tunnel syndrome    Cataract cortical, senile    Cervicalgia    Chronic kidney disease    STAGE 3   CKD (chronic kidney disease)    DDD (degenerative disc disease), lumbar    Depression    Diabetes mellitus without complication (HCC)    does not take any medications   Diverticulosis    Gastritis    GERD (gastroesophageal reflux disease)    Headache    History of hiatal hernia    Hypertension    Lipids serum increased    Lumbago    Migraines    Obesity    Osteoarthritis    Stroke [facility])    TIA (transient ischemic attack)    Wrist fracture     Past Surgical History:  Procedure Laterality Date   BREAST BIOPSY Right 2014   NEG   BREAST BIOPSY Left 11/24/2016   us  core, radial scar    BREAST EXCISIONAL BIOPSY  Left 1967   NEG   CATARACT EXTRACTION W/PHACO Left 06/06/2024   Procedure: PHACOEMULSIFICATION, CATARACT, WITH IOL INSERTION 11.34 01:01.3;  Surgeon: Enola Feliciano Hugger, MD;  Location: [facility];  Service: Ophthalmology;  Laterality: Left;   cataracts Bilateral    COLONOSCOPY N/A 10/30/2023   Procedure: COLONOSCOPY;  Surgeon: Onita Elspeth Sharper, DO;  Location: [facility] ENDOSCOPY;  Service: Gastroenterology;  Laterality: N/A;   COLONOSCOPY N/A 01/08/2024   Procedure: COLONOSCOPY;  Surgeon: Onita Elspeth Sharper, DO;  Location: [facility] ENDOSCOPY;  Service: Gastroenterology;  Laterality: N/A;   COLONOSCOPY WITH PROPOFOL  N/A 05/03/2016   Procedure: COLONOSCOPY WITH PROPOFOL ;  Surgeon: Gladis RAYMOND Mariner, MD;  Location: [facility] ENDOSCOPY;  Service: Endoscopy;  Laterality: N/A;   COLONOSCOPY WITH PROPOFOL  N/A 11/01/2016   Procedure: COLONOSCOPY WITH PROPOFOL ;  Surgeon: Gladis RAYMOND Mariner, MD;  Location: [facility] ENDOSCOPY;  Service: Endoscopy;  Laterality: N/A;   COLONOSCOPY WITH PROPOFOL  N/A 11/02/2016   Procedure: COLONOSCOPY WITH PROPOFOL ;  Surgeon: Gladis RAYMOND Mariner, MD;  Location: [facility] ENDOSCOPY;  Service: Endoscopy;  Laterality: N/A;   COLONOSCOPY WITH PROPOFOL  N/A 12/01/2017   Procedure: COLONOSCOPY WITH PROPOFOL ;  Surgeon: Mariner Gladis RAYMOND, MD;  Location: [facility] ENDOSCOPY;  Service: Endoscopy;  Laterality: N/A;   COLONOSCOPY WITH PROPOFOL  N/A 03/02/2018   Procedure: COLONOSCOPY WITH PROPOFOL ;  Surgeon: Gaylyn Gladis PENNER, MD;  Location: [facility] ENDOSCOPY;  Service: Endoscopy;  Laterality: N/A;   COLONOSCOPY WITH PROPOFOL  N/A 12/28/2020   Procedure: COLONOSCOPY WITH PROPOFOL ;  Surgeon: Unk Corinn Skiff, MD;  Location: [facility] ENDOSCOPY;  Service: Gastroenterology;  Laterality: N/A;   ESOPHAGOGASTRODUODENOSCOPY N/A 01/08/2024   Procedure: EGD (ESOPHAGOGASTRODUODENOSCOPY);  Surgeon: Onita Elspeth Sharper, DO;  Location: [facility] ENDOSCOPY;  Service: Gastroenterology;  Laterality: N/A;    ESOPHAGOGASTRODUODENOSCOPY (EGD) WITH PROPOFOL  N/A 10/26/2015   Procedure: ESOPHAGOGASTRODUODENOSCOPY (EGD) WITH PROPOFOL ;  Surgeon: Deward CINDERELLA Piedmont, MD;  Location: [facility] ENDOSCOPY;  Service: Gastroenterology;  Laterality: N/A;   ESOPHAGOGASTRODUODENOSCOPY (EGD) WITH PROPOFOL  N/A 02/12/2019   Procedure: ESOPHAGOGASTRODUODENOSCOPY (EGD) WITH PROPOFOL ;  Surgeon: Gaylyn Gladis PENNER, MD;  Location: [facility] ENDOSCOPY;  Service: Endoscopy;  Laterality: N/A;   EYE SURGERY     cataract surgery   LIPOMA EXCISION Right 02/17/2022   Procedure: EXCISION LIPOMA;  Surgeon: Tye Millet, DO;  Location: [facility] ORS;  Service: General;  Laterality: Right;   OOPHORECTOMY     left   POLYPECTOMY  01/08/2024   Procedure: POLYPECTOMY, INTESTINE;  Surgeon: Onita Elspeth Sharper, DO;  Location: [facility] ENDOSCOPY;  Service: Gastroenterology;;   ROBLEY INJECTION  10/30/2023   Procedure: INJECTION, SUBMUCOSAL;  Surgeon: Onita Elspeth Sharper, DO;  Location: [facility] ENDOSCOPY;  Service: Gastroenterology;;    Family History  Problem Relation Age of Onset   Leukemia Mother    Arthritis Mother    Leukemia Nephew    Kidney cancer Neg Hx    Prostate cancer Neg Hx    Breast cancer Neg Hx     Social History[1]  Prior to Admission medications  Medication Sig Start Date End Date Taking? Authorizing Provider  docusate sodium  (COLACE) 100 MG capsule Take 1 capsule (100 mg total) by mouth 2 (two) times daily. 07/11/24 07/11/25 Yes Laurita Cort DASEN, MD  acetaminophen  (TYLENOL ) 325 MG tablet Take 2 tablets (650 mg total) by mouth every 6 (six) hours as needed for mild pain, fever or headache. 12/31/20   Elgergawy, Brayton RAMAN, MD  ALPRAZolam  (XANAX ) 0.25 MG tablet Take 0.25 mg by mouth at bedtime as needed for anxiety.    [provider]  amLODipine  (NORVASC ) 2.5 MG tablet Take 2.5 mg by mouth daily.    [provider]  Azelaic Acid  15 % gel Apply QAM to face, wash off at bedtime 01/04/23   Jackquline Sawyer, MD  buPROPion   (WELLBUTRIN  XL) 300 MG 24 hr tablet Take 300 mg by mouth daily. 03/10/21   [provider]  ferrous sulfate  325 (65 FE) MG EC tablet Take 1 tablet by mouth every morning.    [provider]  fexofenadine (ALLEGRA) 60 MG tablet Take 60 mg by mouth as needed.    [provider]  minoxidil  (LONITEN ) 2.5 MG tablet Take 1 tablet (2.5 mg total) by mouth daily. 01/04/23   Jackquline Sawyer, MD  mirabegron  ER (MYRBETRIQ ) 50 MG TB24 tablet Take 1 tablet (50 mg total) by mouth daily. 04/16/24   Vaillancourt, Samantha, PA-C  Multiple Vitamins-Minerals (VITA HAIR) TABS Take 1 tablet by mouth daily. HAIR, SKIN AND NAILS    [provider]  pantoprazole  (PROTONIX ) 40 MG tablet Take 1 tablet (40 mg total) by mouth daily. 12/31/20   Elgergawy, Brayton RAMAN, MD  vitamin B-12 (CYANOCOBALAMIN ) 1000 MCG tablet Take 1,000 mcg by mouth daily.    [provider]  vitamin C (ASCORBIC ACID) 500 MG tablet Take 500 mg by mouth daily.    [provider]    Current Facility-Administered Medications  Medication Dose Route Frequency Provider Last Rate Last Admin   0.9 %  sodium chloride  infusion (Manually program via Guardrails IV Fluids)   Intravenous Once Laurita Manor T, MD       acetaminophen  (TYLENOL ) tablet 650 mg  650 mg Oral Q6H PRN Laurita Manor T, MD       Or   acetaminophen  (TYLENOL ) suppository 650 mg  650 mg Rectal Q6H PRN Laurita Manor DASEN, MD       ALPRAZolam  (XANAX ) tablet 0.25 mg  0.25 mg Oral QHS PRN Laurita Manor DASEN, MD       alum & mag hydroxide-simeth (MAALOX/MYLANTA) 200-200-20 MG/5ML suspension 30 mL  30 mL Oral Q6H PRN Laurita Pillion, MD       amLODipine  (NORVASC ) tablet 2.5 mg  2.5 mg Oral Daily Laurita Manor T, MD   2.5 mg at 07/12/24 9051   buPROPion  (WELLBUTRIN  XL) 24 hr tablet 300 mg  300 mg Oral Daily Laurita Manor T, MD   300 mg at 07/12/24 9050   cyanocobalamin  (VITAMIN B12) tablet 1,000 mcg  1,000 mcg Oral Daily Laurita Pillion, MD       docusate sodium  (COLACE) capsule  100 mg  100 mg Oral BID Laurita Manor T, MD   100 mg at 07/12/24 0949   feeding supplement (ENSURE PLUS HIGH PROTEIN) liquid 237 mL  237 mL Oral BID BM Laurita Manor T, MD   237 mL at 07/12/24 9048   ferrous sulfate  tablet 325 mg  325 mg Oral Q breakfast Laurita Manor T, MD   325 mg at 07/12/24 9048   hydrALAZINE  (APRESOLINE ) injection 5 mg  5 mg Intravenous Q6H PRN Laurita Manor T, MD       loratadine  (CLARITIN ) tablet 10 mg  10 mg Oral Daily Zhang, Ping T, MD   10 mg at 07/12/24 9051   minoxidil  (LONITEN ) tablet 2.5 mg  2.5 mg Oral Daily Laurita Manor T, MD   2.5 mg at 07/12/24 9050   mirabegron  ER (MYRBETRIQ ) tablet 50 mg  50 mg Oral Daily Laurita Manor T, MD   50 mg at 07/12/24 9049   ondansetron  (ZOFRAN ) tablet 4 mg  4 mg Oral Q6H PRN Laurita Manor T, MD       Or   ondansetron  (ZOFRAN ) injection 4 mg  4 mg Intravenous Q6H PRN Laurita Manor T, MD       pantoprazole  (PROTONIX ) EC tablet 40 mg  40 mg Oral Daily Laurita Manor T, MD   40 mg at 07/12/24 9051    Allergies as of 07/11/2024 - Review Complete 07/11/2024  Allergen Reaction Noted   Levaquin [levofloxacin in d5w] Other (See Comments) 10/31/2016   Levofloxacin  10/04/2016   Meloxicam  05/02/2016   Codeine Anxiety and Other (See Comments) 02/07/2013   Sucralfate  Nausea Only 01/10/2020     Review of Systems:    All systems reviewed and negative except where noted in HPI.  Review of Systems  Constitutional:  Negative for chills and fever.  HENT:  Negative for hearing loss.   Respiratory:  Negative for shortness of breath.   Cardiovascular:  Negative for chest pain.  Gastrointestinal:  Positive for blood in stool and constipation.  Musculoskeletal:  Positive for back pain.  Skin:  Negative for rash.  Neurological:  Negative for focal weakness.  Psychiatric/Behavioral:  Negative for substance  abuse.   All other systems reviewed and are negative.      Physical Exam:  Vital signs in last 24 hours: Temp:  [97.8 F (36.6 C)-98.4 F (36.9  C)] 98.4 F (36.9 C) (12/26 1519) Pulse Rate:  [67-83] 83 (12/26 1519) Resp:  [16-18] 16 (12/26 1519) BP: (107-126)/(57-63) 109/60 (12/26 1519) SpO2:  [100 %] 100 % (12/26 1519) Last BM Date : 07/11/24 General:   Pleasant in NAD Head:  Normocephalic and atraumatic. Eyes:   No icterus.   Conjunctiva pink. Mouth: Mucosa pink moist, no lesions. Neck:  Supple; no masses felt Lungs: No respiratory distress Abdomen:   Flat, soft, nondistended, nontender Msk:  No clubbing or cyanosis.Symmetrical without gross deformities. Neurologic:  Alert and  oriented x4;  No focal deficits Skin:  Warm, dry, pink without significant lesions or rashes. Psych:  Alert and cooperative. Normal affect.  LAB RESULTS: Recent Labs    07/11/24 0949 07/11/24 1926 07/12/24 0757 07/12/24 1341  WBC 5.7  --  8.5  --   HGB 7.8* 9.4* 10.3* 9.3*  HCT 24.0* 28.3* 31.3*  --   PLT 330  --  370  --    BMET Recent Labs    07/11/24 0949  NA 138  K 3.9  CL 106  CO2 21*  GLUCOSE 104*  BUN 24*  CREATININE 0.98  CALCIUM  8.1*   LFT Recent Labs    07/11/24 0949  PROT 5.7*  ALBUMIN 3.5  AST 16  ALT 10  ALKPHOS 41  BILITOT 0.3   PT/INR No results for input(s): LABPROT, INR in the last 72 hours.  STUDIES: No results found.     Impression / Plan:   78 y.o. lady with history of recurrent diverticular bleeding, CKD 3, and arthritis who is here with what was initially hematochezia and now is more blood clots and dark stool. Suspect lower GI bleeding from large diverticulum in the ascending colon. Right colon bleeding can cause dark stool. She has had very recent evaluation with EGD/Colonoscopy. She needs aggressive treatment with a bowel regimen.  - added miralax  twice a day, suspect she will need something like linzess  as an outpatient - continue to monitor for overt GI bleeding, if any hemodynamically significant bleeding then would repeat CTA - dc'ed PO iron  as this can cause constipation -  continue PO protonix  - daily cbc's  Will continue to follow, please call with any questions or concerns.  Frederic Schick MD, MPH [facility] GI     [1]  Social History Tobacco Use   Smoking status: Never    Passive exposure: Never   Smokeless tobacco: Never  Vaping Use   Vaping status: Never Used  Substance Use Topics   Alcohol use: Yes    Comment: rarely   Drug use: Never   "

## 2024-07-12 NOTE — Progress Notes (Signed)
" °  Progress Note   Patient: Jackie Hunter FMW:[id number] DOB: 1945/08/17 DOA: 07/11/2024     0 DOS: the patient was seen and examined on 07/12/2024   Brief hospital course: Jackie Hunter is a 78 y.o. female with medical history significant of GI bleed secondary to diverticulosis, HTN, mild intermittent asthma, CKD stage IIIa, presented with multiple complaints including rectal bleeding and increasing shortness of breath.  She described her bleeding as fresh red blood per rectum, however, her stool today in the toilet looks black. Hemoglobin was dropped down to 7.8, she received 1 unit of PRBC.  Hemoglobin is better.   Principal Problem:   Symptomatic anemia Active Problems:   Essential hypertension   Benign essential hypertension   Rectal bleeding   B12 deficiency   Acute blood loss anemia   CKD (chronic kidney disease), stage II   Assessment and Plan: GI bleed, Acute blood loss anemia. Vitamin B12 deficiency. Patient has diverticulosis with recent admission with lower GI bleed.  Came back to the hospital with rectal bleeding for the last 5 days.  Initially was bright red, now become dark red to black.  Hemoglobin dropped down to 7.2, received blood transfusion. Hemoglobin is more stable, but still has significant rectal bleeding.  Will consult GI. Patient had a history of vitamin B12 deficiency, restart B12. Continue monitor hemoglobin, transfuse as needed.  Essential hypertension. Continues on home medicines  Chronic kidney stage II. Renal function slightly worsened due to acute bleeding.  Continue to monitor.  Overweight with BMI 26.98  Diet and exercise.      Subjective:  Patient still has some rectal bleeding, looks of black.  No dizziness or shortness of breath  Physical Exam: Vitals:   07/11/24 1545 07/11/24 1950 07/12/24 0418 07/12/24 0802  BP: (!) 116/59 107/63 (!) 126/59 (!) 118/57  Pulse: 67 79 73 69  Resp: 18 18 18 16   Temp: 98 F (36.7 C) 97.8 F  (36.6 C) 98.1 F (36.7 C) 98 F (36.7 C)  TempSrc: Oral Oral Oral Oral  SpO2: 100% 100% 100% 100%  Weight:      Height:       General exam: Appears calm and comfortable  Respiratory system: Clear to auscultation. Respiratory effort normal. Cardiovascular system: S1 & S2 heard, RRR. No JVD, murmurs, rubs, gallops or clicks. No pedal edema. Gastrointestinal system: Abdomen is nondistended, soft and nontender. No organomegaly or masses felt. Normal bowel sounds heard. Central nervous system: Alert and oriented. No focal neurological deficits. Extremities: Symmetric 5 x 5 power. Skin: No rashes, lesions or ulcers Psychiatry: Judgement and insight appear normal. Mood & affect appropriate.    Data Reviewed:  Lab results reviewed.  Family Communication: None  Disposition: Status is: Observation      Time spent: 35 minutes  Author: Murvin Mana, MD 07/12/2024 1:16 PM  For on call review [email or web address]    "

## 2024-07-12 NOTE — Hospital Course (Addendum)
 Jackie Hunter is a 78 y.o. female with medical history significant of GI bleed secondary to diverticulosis, HTN, mild intermittent asthma, CKD stage IIIa, presented with multiple complaints including rectal bleeding and increasing shortness of breath.  She described her bleeding as fresh red blood per rectum, however, her stool today in the toilet looks black. Hemoglobin was dropped down to 7.8, she received 1 unit of PRBC.  Hemoglobin is better.  Patient is seen by GI, not recommend any workup.  Patient was given lactulose , had a multiple stools yesterday, there is no blood or black stools.  Currently, she is medically stable for discharge.  Due to recurrent diverticulosis bleeding, patient will be referred to general surgery as outpatient.

## 2024-07-13 DIAGNOSIS — E538 Deficiency of other specified B group vitamins: Secondary | ICD-10-CM | POA: Diagnosis not present

## 2024-07-13 DIAGNOSIS — D62 Acute posthemorrhagic anemia: Secondary | ICD-10-CM | POA: Diagnosis not present

## 2024-07-13 DIAGNOSIS — D649 Anemia, unspecified: Secondary | ICD-10-CM | POA: Diagnosis not present

## 2024-07-13 LAB — HEMOGLOBIN: Hemoglobin: 8.8 g/dL — ABNORMAL LOW (ref 12.0–15.0)

## 2024-07-13 MED ORDER — TRAZODONE HCL 50 MG PO TABS
50.0000 mg | ORAL_TABLET | Freq: Every day | ORAL | Status: DC
  Administered 2024-07-13: 50 mg via ORAL
  Filled 2024-07-13: qty 1

## 2024-07-13 MED ORDER — LACTULOSE 10 GM/15ML PO SOLN
20.0000 g | Freq: Once | ORAL | Status: AC
  Administered 2024-07-13: 20 g via ORAL
  Filled 2024-07-13: qty 30

## 2024-07-13 NOTE — Progress Notes (Signed)
" °  Progress Note   Patient: Jackie Hunter FMW:[id number] DOB: [birth year] DOA: 07/11/2024     0 DOS: the patient was seen and examined on 07/13/2024   Brief hospital course: Jackie Hunter is a 78 y.o. female with medical history significant of GI bleed secondary to diverticulosis, HTN, mild intermittent asthma, CKD stage IIIa, presented with multiple complaints including rectal bleeding and increasing shortness of breath.  She described her bleeding as fresh red blood per rectum, however, her stool today in the toilet looks black. Hemoglobin was dropped down to 7.8, she received 1 unit of PRBC.  Hemoglobin is better.   Principal Problem:   Symptomatic anemia Active Problems:   Essential hypertension   Benign essential hypertension   Rectal bleeding   B12 deficiency   Acute blood loss anemia   CKD (chronic kidney disease), stage II   Assessment and Plan: GI bleed, most likely lower GI bleed from diverticulosis. Acute blood loss anemia. Vitamin B12 deficiency. Patient has diverticulosis with recent admission with lower GI bleed.  Came back to the hospital with rectal bleeding for the last 5 days.  Initially was bright red, now become dark red to black.  Hemoglobin dropped down to 7.2, received blood transfusion. Hemoglobin is more stable, but still has significant rectal bleeding.  Will consult GI. Patient had a history of vitamin B12 deficiency, restarted B12. Patient hemoglobin has been stable, patient still has some black stool yesterday, appeared to be old blood from colon.  No bowel movement today.  Will continue monitor for 1 more day, likely discharge home tomorrow.  Will refer patient to general surgery after discharge as patient appeared to have a recurrent diverticulosis bleed.   Essential hypertension. Continues on home medicines   Chronic kidney stage II. Renal function slightly worsened due to acute bleeding.  Continue to monitor.   Overweight with BMI 26.98  Diet  and exercise.       Subjective:  Patient doing well today, no bowel movement since yesterday.  Physical Exam: Vitals:   07/12/24 1519 07/12/24 1952 07/13/24 0355 07/13/24 0747  BP: 109/60 (!) 124/58 112/61 (!) 115/52  Pulse: 83 82 75 71  Resp: 16 16 16 18   Temp: 98.4 F (36.9 C) 98.4 F (36.9 C) 98 F (36.7 C) 97.9 F (36.6 C)  TempSrc:  Oral Oral Oral  SpO2: 100% 98% 99% 100%  Weight:      Height:       General exam: Appears calm and comfortable  Respiratory system: Clear to auscultation. Respiratory effort normal. Cardiovascular system: S1 & S2 heard, RRR. No JVD, murmurs, rubs, gallops or clicks. No pedal edema. Gastrointestinal system: Abdomen is nondistended, soft and nontender. No organomegaly or masses felt. Normal bowel sounds heard. Central nervous system: Alert and oriented. No focal neurological deficits. Extremities: Symmetric 5 x 5 power. Skin: No rashes, lesions or ulcers Psychiatry: Judgement and insight appear normal. Mood & affect appropriate.    Data Reviewed:  Lab results reviewed.  Family Communication: Daughter updated over the phone  Disposition: Status is: Observation      Time spent: 35 minutes  Author: Murvin Mana, MD 07/13/2024 12:21 PM  For on call review [email or web address]    "

## 2024-07-13 NOTE — Plan of Care (Signed)

## 2024-07-13 NOTE — Plan of Care (Signed)
" °  Problem: Clinical Measurements: Goal: Ability to maintain clinical measurements within normal limits will improve Outcome: Progressing   Problem: Safety: Goal: Ability to remain free from injury will improve Outcome: Progressing   Problem: Pain Managment: Goal: General experience of comfort will improve and/or be controlled Outcome: Progressing   Problem: Elimination: Goal: Will not experience complications related to bowel motility Outcome: Progressing   Problem: Coping: Goal: Level of anxiety will decrease Outcome: Progressing   "

## 2024-07-13 NOTE — Progress Notes (Signed)
 GI Inpatient Follow-up Note  Subjective:  Patient seen and doing well. States she feels less dizzy. No bowel movements since yesterday.  Scheduled Inpatient Medications:   sodium chloride    Intravenous Once   amLODipine   2.5 mg Oral Daily   buPROPion   300 mg Oral Daily   vitamin B-12  1,000 mcg Oral Daily   docusate sodium   100 mg Oral BID   feeding supplement  237 mL Oral BID BM   loratadine   10 mg Oral Daily   minoxidil   2.5 mg Oral Daily   mirabegron  ER  50 mg Oral Daily   pantoprazole   40 mg Oral Daily   polyethylene glycol  17 g Oral BID   psyllium  1 packet Oral Daily    Continuous Inpatient Infusions:    PRN Inpatient Medications:  acetaminophen  **OR** acetaminophen , ALPRAZolam , alum & mag hydroxide-simeth, hydrALAZINE , ondansetron  **OR** ondansetron  (ZOFRAN ) IV  Review of Systems:  Review of Systems  Constitutional:  Negative for chills and fever.  Respiratory:  Negative for shortness of breath.   Cardiovascular:  Negative for chest pain.  Gastrointestinal:  Negative for abdominal pain, blood in stool, constipation, diarrhea and vomiting.  Musculoskeletal:  Positive for joint pain.  Neurological:  Negative for dizziness.  Psychiatric/Behavioral:  Negative for substance abuse.   All other systems reviewed and are negative.     Physical Examination: BP (!) 115/52 (BP Location: Left Arm)   Pulse 71   Temp 97.9 F (36.6 C) (Oral)   Resp 18   Ht 5' 3 (1.6 m)   Wt 69.1 kg   SpO2 100%   BMI 26.98 kg/m  Gen: NAD, alert and oriented x 4 HEENT: PEERLA, EOMI, Neck: supple, no JVD or thyromegaly Chest: No respiratory distress Abd: soft, non-tender, non-distended Ext: no edema, well perfused with 2+ pulses, Skin: no rash or lesions noted Lymph: no LAD  Data: Lab Results  Component Value Date   WBC 8.5 07/12/2024   HGB 8.8 (L) 07/13/2024   HCT 31.3 (L) 07/12/2024   MCV 86.0 07/12/2024   PLT 370 07/12/2024   Recent Labs  Lab 07/12/24 1341  07/12/24 1635 07/13/24 0513  HGB 9.3* 8.7* 8.8*   Lab Results  Component Value Date   NA 138 07/11/2024   K 3.9 07/11/2024   CL 106 07/11/2024   CO2 21 (L) 07/11/2024   BUN 24 (H) 07/11/2024   CREATININE 0.98 07/11/2024   Lab Results  Component Value Date   ALT 10 07/11/2024   AST 16 07/11/2024   ALKPHOS 41 07/11/2024   BILITOT 0.3 07/11/2024   No results for input(s): APTT, INR, PTT in the last 168 hours. Assessment/Plan: Ms. Jackie Hunter is a 78 y.o. lady with history of recurrent diverticular bleeding, CKD 3, and arthritis who is here with what was initially hematochezia and now is more blood clots and dark stool. Suspect lower GI bleeding from large diverticulum in the ascending colon. Right colon bleeding can cause dark stool. She has had very recent evaluation with EGD/Colonoscopy. Co  Recommendations:  - continue bowel regimen, suspect she will need a prescription medicine like linzess  but this can be started as an outpatient. - continue to monitor for overt GI bleeding, if any hemodynamically significant bleeding then would repeat CTA - continue PO protonix  - daily cbc's - encouraged to get up and walk   Will continue to follow, please call with any questions or concerns.   Frederic Schick MD, MPH [facility] GI

## 2024-07-14 DIAGNOSIS — I10 Essential (primary) hypertension: Secondary | ICD-10-CM

## 2024-07-14 DIAGNOSIS — D649 Anemia, unspecified: Secondary | ICD-10-CM | POA: Diagnosis not present

## 2024-07-14 DIAGNOSIS — D62 Acute posthemorrhagic anemia: Secondary | ICD-10-CM | POA: Diagnosis not present

## 2024-07-14 LAB — HEMOGLOBIN: Hemoglobin: 8.3 g/dL — ABNORMAL LOW (ref 12.0–15.0)

## 2024-07-14 MED ORDER — LINACLOTIDE 72 MCG PO CAPS: 72.0000 ug | ORAL_CAPSULE | Freq: Every day | ORAL | 0 refills | Status: AC

## 2024-07-14 MED ORDER — TRAZODONE HCL 50 MG PO TABS: 50.0000 mg | ORAL_TABLET | Freq: Every day | ORAL | 0 refills | Status: AC

## 2024-07-14 MED ORDER — POLYETHYLENE GLYCOL 3350 17 G PO PACK: 17.0000 g | PACK | Freq: Two times a day (BID) | ORAL | 0 refills | Status: AC

## 2024-07-14 MED ORDER — LACTULOSE 10 GM/15ML PO SOLN: 20.0000 g | Freq: Once | ORAL | Status: DC

## 2024-07-14 NOTE — Plan of Care (Signed)
  Problem: Clinical Measurements: Goal: Ability to maintain clinical measurements within normal limits will improve Outcome: Progressing   Problem: Coping: Goal: Level of anxiety will decrease Outcome: Progressing   Problem: Elimination: Goal: Will not experience complications related to bowel motility Outcome: Progressing   Problem: Safety: Goal: Ability to remain free from injury will improve Outcome: Progressing   Problem: Pain Managment: Goal: General experience of comfort will improve and/or be controlled Outcome: Progressing

## 2024-07-14 NOTE — Discharge Summary (Signed)
 " Physician Discharge Summary   Patient: Jackie Hunter MRN: [id number] DOB: April 19, 1946  Admit date:     07/11/2024  Discharge date: 07/14/2024  Discharge Physician: Murvin Mana   PCP: Sadie Manna, MD   Recommendations at discharge:   Follow-up with PCP in 1 week. Check a BMP and a CBC at the next office visit. Refer to general surgery to be seen in 2 weeks. Follow-up with GI in 2 weeks.  Discharge Diagnoses: Principal Problem:   Symptomatic anemia Active Problems:   Essential hypertension   Benign essential hypertension   Rectal bleeding   B12 deficiency   Acute blood loss anemia   CKD (chronic kidney disease), stage II  Resolved Problems:   * No resolved hospital problems. *  Hospital Course: Jackie Hunter is a 78 y.o. female with medical history significant of GI bleed secondary to diverticulosis, HTN, mild intermittent asthma, CKD stage IIIa, presented with multiple complaints including rectal bleeding and increasing shortness of breath.  She described her bleeding as fresh red blood per rectum, however, her stool today in the toilet looks black. Hemoglobin was dropped down to 7.8, she received 1 unit of PRBC.  Hemoglobin is better.  Patient is seen by GI, not recommend any workup.  Patient was given lactulose , had a multiple stools yesterday, there is no blood or black stools.  Currently, she is medically stable for discharge.  Due to recurrent diverticulosis bleeding, patient will be referred to general surgery as outpatient.  Assessment and Plan: GI bleed, most likely lower GI bleed from diverticulosis. Acute blood loss anemia. Vitamin B12 deficiency. Patient has diverticulosis with recent admission with lower GI bleed.  Came back to the hospital with rectal bleeding for the last 5 days.  Initially was bright red, now become dark red to black.  Hemoglobin dropped down to 7.2, received blood transfusion. Hemoglobin is more stable, but still has significant  rectal bleeding.  Will consult GI. Patient had a history of vitamin B12 deficiency, restarted B12. 12/27.Patient hemoglobin has been stable, patient still has some black stool yesterday, appeared to be old blood from colon.  No bowel movement today.  Will continue monitor for 1 more day, likely discharge home tomorrow.  Will refer patient to general surgery after discharge as patient appeared to have a recurrent diverticulosis bleed. 12/28.  Patient has multiple bowel movements after lactulose  yesterday, no longer has any black stool or bleeding.  Discussed with GI, patient is medically stable for discharge.  Will schedule follow-up with GI and refer to general surgery for recurrent diverticulosis bleeding.   Essential hypertension. Discontinued losartan/amlodipine .  Blood pressure not significant elevated.   Chronic kidney stage II. Renal function slightly worsened due to acute bleeding, which has improved to baseline.   Overweight with BMI 26.98  Diet and exercise.          Consultants: GI Procedures performed: None  Disposition: Home Diet recommendation:  Discharge Diet Orders (From admission, onward)     Start     Ordered   07/14/24 0000  Diet - low sodium heart healthy        07/14/24 0951           Cardiac diet DISCHARGE MEDICATION: Allergies as of 07/14/2024       Reactions   Levaquin [levofloxacin In D5w] Other (See Comments)   dizziness   Levofloxacin    Other reaction(s): Dizziness dizziness   Meloxicam    Other reaction(s): Other (See Comments) GI upset  Codeine Anxiety, Other (See Comments)   Other reaction(s): Unknown (See Comments) Increased heart rate Difficulty breathing; arrythmia   Sucralfate  Nausea Only        Medication List     STOP taking these medications    amLODipine  2.5 MG tablet Commonly known as: NORVASC    amLODipine -olmesartan 5-20 MG tablet Commonly known as: AZOR   ascorbic acid 500 MG tablet Commonly known as:  VITAMIN C   Azelaic Acid  15 % gel       TAKE these medications    acetaminophen  325 MG tablet Commonly known as: TYLENOL  Take 2 tablets (650 mg total) by mouth every 6 (six) hours as needed for mild pain, fever or headache.   albuterol  108 (90 Base) MCG/ACT inhaler Commonly known as: VENTOLIN  HFA Inhale 2 puffs into the lungs every 6 (six) hours as needed for wheezing or shortness of breath.   ALPRAZolam  0.25 MG tablet Commonly known as: XANAX  Take 0.25 mg by mouth at bedtime as needed for anxiety.   buPROPion  300 MG 24 hr tablet Commonly known as: WELLBUTRIN  XL Take 300 mg by mouth daily.   cyanocobalamin  1000 MCG tablet Commonly known as: VITAMIN B12 Take 1,000 mcg by mouth daily.   docusate sodium  100 MG capsule Commonly known as: Colace Take 1 capsule (100 mg total) by mouth 2 (two) times daily.   estradiol 0.01 % Crea vaginal cream Commonly known as: ESTRACE Place 1 Applicatorful vaginally at bedtime.   ferrous sulfate  325 (65 FE) MG EC tablet Take 1 tablet by mouth every morning.   fexofenadine 60 MG tablet Commonly known as: ALLEGRA Take 60 mg by mouth as needed.   finasteride  5 MG tablet Commonly known as: PROSCAR  Take 5 mg by mouth daily.   linaclotide  72 MCG capsule Commonly known as: Linzess  Take 1 capsule (72 mcg total) by mouth daily before breakfast.   minoxidil  2.5 MG tablet Commonly known as: LONITEN  Take 1 tablet (2.5 mg total) by mouth daily.   mirabegron  ER 50 MG Tb24 tablet Commonly known as: MYRBETRIQ  Take 1 tablet (50 mg total) by mouth daily.   pantoprazole  40 MG tablet Commonly known as: Protonix  Take 1 tablet (40 mg total) by mouth daily.   polyethylene glycol 17 g packet Commonly known as: MIRALAX  / GLYCOLAX  Take 17 g by mouth 2 (two) times daily.   Semaglutide 3 MG Tabs Take 3 mg by mouth daily.   traZODone  50 MG tablet Commonly known as: DESYREL  Take 1 tablet (50 mg total) by mouth at bedtime.   Vita Hair  Tabs Take 1 tablet by mouth daily. HAIR, SKIN AND NAILS        Follow-up Information     Sadie Manna, MD Follow up in 1 week(s).   Specialty: Internal Medicine Why: Hospital follow up Contact information: [address] [place] [address] [phone]         Tye Millet, DO Follow up in 2 week(s).   Specialties: General Surgery, Surgery Contact information: [address] [place] [address] [phone]         Maryruth Ole DASEN, MD Follow up in 2 week(s).   Specialty: Gastroenterology Contact information: [address] [place] [address] [phone]                Discharge Exam: Jackie Hunter   07/11/24 9078 07/11/24 1244  Weight: 70.3 kg 69.1 kg   General exam: Appears calm and comfortable  Respiratory system: Clear to auscultation.  Respiratory effort normal. Cardiovascular system: S1 & S2 heard, RRR. No JVD, murmurs, rubs, gallops or clicks. No pedal edema. Gastrointestinal system: Abdomen is nondistended, soft and nontender. No organomegaly or masses felt. Normal bowel sounds heard. Central nervous system: Alert and oriented. No focal neurological deficits. Extremities: Symmetric 5 x 5 power. Skin: No rashes, lesions or ulcers Psychiatry: Judgement and insight appear normal. Mood & affect appropriate.    Condition at discharge: good  The results of significant diagnostics from this hospitalization (including imaging, microbiology, ancillary and laboratory) are listed below for reference.   Imaging Studies: CT ANGIO GI BLEED Result Date: 07/08/2024 CLINICAL DATA:  Melena and bright lead blood per rectum. EXAM: CTA ABDOMEN AND PELVIS WITHOUT AND WITH CONTRAST TECHNIQUE: Multidetector CT imaging of the abdomen and pelvis was performed using the standard protocol during bolus administration of intravenous contrast. Multiplanar reconstructed images and MIPs were obtained and reviewed to  evaluate the vascular anatomy. RADIATION DOSE REDUCTION: This exam was performed according to the departmental dose-optimization program which includes automated exposure control, adjustment of the mA and/or kV according to patient size and/or use of iterative reconstruction technique. CONTRAST:  OMNIPAQUE  IOHEXOL  350 MG/ML SOLN COMPARISON:  09/24/2023, 06/10/2021. FINDINGS: VASCULAR Aorta: Normal caliber aorta without aneurysm, dissection, vasculitis or significant stenosis. Aortic atherosclerosis. Celiac: Patent without evidence of aneurysm, dissection, vasculitis or significant stenosis. SMA: Patent without evidence of aneurysm, dissection, vasculitis or significant stenosis. Renals: Both renal arteries are patent without evidence of dissection or stenosis. The proximal right renal artery has a beaded appearance, unchanged from the prior exam. IMA: Patent without evidence of aneurysm, dissection, vasculitis or significant stenosis. Inflow: Patent without evidence of aneurysm, dissection, vasculitis or significant stenosis. Proximal Outflow: Bilateral common femoral and visualized portions of the superficial and profunda femoral arteries are patent without evidence of aneurysm, dissection, vasculitis or significant stenosis. Veins: No obvious venous abnormality within the limitations of this arterial phase study. Review of the MIP images confirms the above findings. NON-VASCULAR Lower chest: Mild atelectasis or scarring is present at the lung bases. Hepatobiliary: Hypervascular focus is noted in the anterior right lobe of the liver, suggesting hemangioma versus transient hepatic arterial differences. No gallstones, gallbladder wall thickening, or biliary dilatation. Pancreas: Unremarkable. No pancreatic ductal dilatation or surrounding inflammatory changes. Spleen: Normal in size without focal abnormality. Adrenals/Urinary Tract: The adrenal glands are within normal limits. Multiple scattered hypodensities  are noted in the kidneys bilaterally, not significantly changed from prior exams. Hyperdense structure is noted in the lower pole of the left kidney, likely reflecting a proteinaceous or hemorrhagic cyst. A small renal calculus is noted on the left. No hydronephrosis bilaterally. The bladder is unremarkable. Stomach/Bowel: There is a small hiatal hernia. No bowel obstruction, free air, or pneumatosis is seen. Appendix appears normal. There is extensive colonic diverticulosis without evidence of diverticulitis. No acute or active hemorrhage is seen. Lymphatic: No abdominal or pelvic lymphadenopathy. Reproductive: Uterus and bilateral adnexa are unremarkable. Other: No abdominopelvic ascites. A fat containing umbilical hernia is present. Musculoskeletal: Degenerative changes are noted in the thoracolumbar spine. No acute osseous abnormality. IMPRESSION: VASCULAR 1. No evidence of acute or active hemorrhage. 2. Aortic atherosclerosis without evidence of aneurysm or dissection. 3. 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 4. 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 NON-VASCULAR 1. Extensive colonic diverticulosis without evidence of diverticulitis or active hemorrhage. 2. Small hiatal hernia. Electronically Signed  By: Leita Birmingham M.D.   On: 07/08/2024 11:44    Microbiology: Results for orders placed or performed during the hospital encounter of 07/11/24  SARS Coronavirus 2 by RT PCR (hospital order, performed in [facility] hospital lab) *cepheid single result test* Anterior Nasal Swab     Status: None   Collection Time: 07/11/24  5:58 PM   Specimen: Anterior Nasal Swab  Result Value Ref Range Status   SARS Coronavirus 2 by RT PCR NEGATIVE NEGATIVE Final    Comment: (NOTE) SARS-CoV-2 target nucleic acids are NOT DETECTED.  The SARS-CoV-2 RNA is generally detectable in upper and lower respiratory  specimens during the acute phase of infection. The lowest concentration of SARS-CoV-2 viral copies this assay can detect is 250 copies / mL. A negative result does not preclude SARS-CoV-2 infection and should not be used as the sole basis for treatment or other patient management decisions.  A negative result may occur with improper specimen collection / handling, submission of specimen other than nasopharyngeal swab, presence of viral mutation(s) within the areas targeted by this assay, and inadequate number of viral copies (<250 copies / mL). A negative result must be combined with clinical observations, patient history, and epidemiological information.  Fact Sheet for Patients:   roadlaptop.co.za  Fact Sheet for Healthcare Providers: http://kim-miller.com/  This test is not yet approved or  cleared by the [place]  FDA and has been authorized for detection and/or diagnosis of SARS-CoV-2 by FDA under an Emergency Use Authorization (EUA).  This EUA will remain in effect (meaning this test can be used) for the duration of the COVID-19 declaration under Section 564(b)(1) of the Act, 21 U.S.C. section 360bbb-3(b)(1), unless the authorization is terminated or revoked sooner.  Performed at [facility], [address] [place], [place] [address]     Labs: CBC: Recent Labs  Lab 07/08/24 0915 07/11/24 0949 07/11/24 1926 07/12/24 0757 07/12/24 1341 07/12/24 1635 07/13/24 0513 07/14/24 0517  WBC 7.2 5.7  --  8.5  --   --   --   --   NEUTROABS 4.8 3.6  --   --   --   --   --   --   HGB 11.2* 7.8* 9.4* 10.3* 9.3* 8.7* 8.8* 8.3*  HCT 33.7* 24.0* 28.3* 31.3*  --   --   --   --   MCV 93.4 94.1  --  86.0  --   --   --   --   PLT 334 330  --  370  --   --   --   --    Basic Metabolic Panel: Recent Labs  Lab 07/08/24 0915 07/11/24 0949  NA 137 138  K 4.0 3.9  CL 105 106  CO2 23 21*  GLUCOSE 106* 104*  BUN 27* 24*   CREATININE 1.06* 0.98  CALCIUM  8.4* 8.1*   Liver Function Tests: Recent Labs  Lab 07/08/24 0915 07/11/24 0949  AST 17 16  ALT 13 10  ALKPHOS 50 41  BILITOT 0.3 0.3  PROT 6.3* 5.7*  ALBUMIN 3.8 3.5   CBG: No results for input(s): GLUCAP in the last 168 hours.  Discharge time spent: 35 minutes.  Signed: Murvin Mana, MD [facility] 07/14/2024 "

## 2025-04-16 ENCOUNTER — Ambulatory Visit: Admitting: Physician Assistant
